# Patient Record
Sex: Female | Born: 1940 | Race: Black or African American | Hispanic: No | Marital: Single | Smoking: Never smoker
Health system: Southern US, Community
[De-identification: ages and names within clinical notes are randomized; demographics above are authoritative.]

## PROBLEM LIST (undated history)

## (undated) DIAGNOSIS — I10 Essential (primary) hypertension: Secondary | ICD-10-CM

## (undated) DIAGNOSIS — E119 Type 2 diabetes mellitus without complications: Secondary | ICD-10-CM

## (undated) DIAGNOSIS — M1990 Unspecified osteoarthritis, unspecified site: Secondary | ICD-10-CM

## (undated) DIAGNOSIS — K635 Polyp of colon: Secondary | ICD-10-CM

## (undated) DIAGNOSIS — E785 Hyperlipidemia, unspecified: Secondary | ICD-10-CM

## (undated) DIAGNOSIS — K922 Gastrointestinal hemorrhage, unspecified: Secondary | ICD-10-CM

## (undated) DIAGNOSIS — C50919 Malignant neoplasm of unspecified site of unspecified female breast: Secondary | ICD-10-CM

## (undated) DIAGNOSIS — Z923 Personal history of irradiation: Secondary | ICD-10-CM

## (undated) DIAGNOSIS — I639 Cerebral infarction, unspecified: Secondary | ICD-10-CM

## (undated) DIAGNOSIS — Z9221 Personal history of antineoplastic chemotherapy: Secondary | ICD-10-CM

## (undated) HISTORY — PX: ABDOMINAL HYSTERECTOMY: SHX81

## (undated) HISTORY — DX: Unspecified osteoarthritis, unspecified site: M19.90

## (undated) HISTORY — DX: Cerebral infarction, unspecified: I63.9

## (undated) HISTORY — PX: BREAST BIOPSY: SHX20

## (undated) HISTORY — DX: Gastrointestinal hemorrhage, unspecified: K92.2

## (undated) HISTORY — DX: Malignant neoplasm of unspecified site of unspecified female breast: C50.919

## (undated) HISTORY — DX: Polyp of colon: K63.5

## (undated) HISTORY — DX: Essential (primary) hypertension: I10

## (undated) HISTORY — DX: Type 2 diabetes mellitus without complications: E11.9

---

## 2014-08-02 HISTORY — PX: BREAST LUMPECTOMY: SHX2

## 2016-12-06 ENCOUNTER — Encounter (HOSPITAL_COMMUNITY): Payer: Self-pay | Admitting: Emergency Medicine

## 2016-12-06 ENCOUNTER — Emergency Department (HOSPITAL_COMMUNITY)
Admission: EM | Admit: 2016-12-06 | Discharge: 2016-12-06 | Disposition: A | Discharge status: Home / Self Care | Payer: Medicare Other | Attending: Emergency Medicine | Admitting: Emergency Medicine

## 2016-12-06 DIAGNOSIS — Z5321 Procedure and treatment not carried out due to patient leaving prior to being seen by health care provider: Secondary | ICD-10-CM | POA: Diagnosis not present

## 2016-12-06 DIAGNOSIS — R42 Dizziness and giddiness: Secondary | ICD-10-CM | POA: Diagnosis not present

## 2016-12-06 HISTORY — DX: Essential (primary) hypertension: I10

## 2016-12-06 HISTORY — DX: Type 2 diabetes mellitus without complications: E11.9

## 2016-12-06 HISTORY — DX: Hyperlipidemia, unspecified: E78.5

## 2016-12-06 LAB — BASIC METABOLIC PANEL
Anion gap: 10 (ref 5–15)
BUN: 26 mg/dL — AB (ref 6–20)
CHLORIDE: 102 mmol/L (ref 101–111)
CO2: 23 mmol/L (ref 22–32)
CREATININE: 1.3 mg/dL — AB (ref 0.44–1.00)
Calcium: 8.9 mg/dL (ref 8.9–10.3)
GFR calc Af Amer: 45 mL/min — ABNORMAL LOW (ref >60)
GFR calc non Af Amer: 39 mL/min — ABNORMAL LOW (ref >60)
GLUCOSE: 148 mg/dL — AB (ref 65–99)
Potassium: 4.3 mmol/L (ref 3.5–5.1)
Sodium: 135 mmol/L (ref 135–145)

## 2016-12-06 LAB — CBC
HCT: 35.1 % — ABNORMAL LOW (ref 36.0–46.0)
Hemoglobin: 11.3 g/dL — ABNORMAL LOW (ref 12.0–15.0)
MCH: 27.2 pg (ref 26.0–34.0)
MCHC: 32.2 g/dL (ref 30.0–36.0)
MCV: 84.4 fL (ref 78.0–100.0)
PLATELETS: 192 10*3/uL (ref 150–400)
RBC: 4.16 MIL/uL (ref 3.87–5.11)
RDW: 14.1 % (ref 11.5–15.5)
WBC: 6.5 10*3/uL (ref 4.0–10.5)

## 2016-12-06 LAB — CBG MONITORING, ED: Glucose-Capillary: 147 mg/dL — ABNORMAL HIGH (ref 65–99)

## 2016-12-06 NOTE — ED Triage Notes (Signed)
Brought in by EMS from home with c/o intermittent dizziness, onset a week ago after moving here to [place].  Pt also reports multiple falls from dizziness but no injury from those falls.  Pt denies any pain.

## 2016-12-06 NOTE — ED Notes (Signed)
Bed: FV43 Expected date:  Expected time:  Means of arrival:  Comments: 76 yr old dizziness

## 2016-12-06 NOTE — ED Notes (Signed)
Pt and pt's son requested to have PIV removed because ,"We have to go".

## 2016-12-16 ENCOUNTER — Telehealth: Payer: Self-pay | Admitting: *Deleted

## 2016-12-16 NOTE — Telephone Encounter (Signed)
"  They're faxing papers for me.  I newly moved to [place].  Have they been received?  Call me [phone]."  "I called to make sure Dr. Marin Olp received my records from [place].  I have a port-a-cath I need work done to.  it was worked on the last of July.  I live in [place].  Records were faxed to [phone]."  New patient coordinator has not received records.  Asked patient to have the office refax indicating new patient referral.  The [facility] office will assign a provider with first availability pending receipt of records and patient needs.  Provided regular fax number [phone] as an alternative.

## 2016-12-17 ENCOUNTER — Telehealth: Payer: Self-pay | Admitting: Hematology

## 2016-12-17 NOTE — Telephone Encounter (Signed)
Pt has been scheduled to see Dr. Burr Medico on 9/6 at 11am. Pt stated that she still has port in place and needs to be flushed. Address and insurance verified. Letter mailed.

## 2016-12-31 NOTE — Progress Notes (Signed)
[facility]  Telephone:([phone]) [phone] [phone]) [phone]  Clinic New Consult Note   Patient Care Team: Patient, No Pcp Per as PCP - General (General Practice) 01/02/2017  CHIEF COMPLAINTS/PURPOSE OF CONSULTATION:  Patient relocated from [place]; here to establish care with primary oncologist for Malignant neoplasm of UIQ right breast  HISTORY OF PRESENTING ILLNESS:  Jasmine Carlson 77 y.o. female is here because of right breast cancer that was found on an annual screening mammogram on 05/25/2014 in [place]. A lump was not palpable. She had fatigue at the time of initial diagnosis. Pt was evaluated at [facility] with Medical Oncology and Radiation Oncology.   Pt had a mammogram completed on 05/25/2014 that showed suspicious lesion at the 3 o'clock position in the right breast. She underwent US on 06/15/2014 that showed suspicious nodule, a spiculated, hypoechoic mass, 1.1 cm at the 1 o'clock position. 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 Pt had a right breast lumpectomy completed on 08/02/2014 with results revealing: Invasive ductal carcinoma, grade 2 with surgical margins free of carcinoma. Of the 2 lymphnodes excised, all were negative with superior, inferior, medial, and lateral margins negative. final stage was T1cN0 dx. ER 100%+, PR 75-80%+ and HER2/neu amplified by FISH. Pt had a DEXA scan completed on 09/06/2014: IMPRESSION: Increased activity noted in the cervical spine which is most likely secondary to degenerative changes. Followup study is recommended. No abnormal long bone activity. Degenerative changes noted in the knees and feet. Pt also underwent a CT chest on 09/06/14 with results revealing:  IMPRESSION: 1. Multiple pulmonary nodules bilaterally up to 5 mm are nonspecific and metastases cannot be excluded. Recommend a close f/u chest CT in 3  months to assess stability. 2. No thoracic lymphadenopathy. 3. Soft tissue mass in the right breast as described is consistent with the known primary malignancy. Pt had a follow up CT CAP completed on 09/13/14 with results: IMPRESSION: Adjacent soft tissue nodules infereior to the right hepatic lobe posteriorly up to 0.9 cm are nonspecific. Continued CT followup is recommended in this patient with breast cancer. 2. There is otherwise no mass or LAD in the abdomen and pelvis. 3. Colonic diverticulosis. 4. status post hysterectomy.   She completed chemotherapy with Taxol and Herceptin for 3 months, then completed single agent Herceptin on 10/03/2015. She remembers tolerating this well without persistent side effects.She was treated with adjuvant external beam radiation from 06/12/2015 to 08/04/2015. She began adjuvant endocrine therapy on Arimidex after radiation.  Pt had a follow up CT chest with contrast completed on 06/25/2016 with results revealing: IMPRESSION: 1. Unchanged multiple subcentimeter pulmonary nodules bilaterally. there is no new pulmonary nodules. 2. No thoracic LAD. 3. new right lung post radiation changes. 4. New diffuse right breast skin thicking suggestive of postradiation changes.   Jasmine Carlson presents to the office today as scheduled. She is tolerating Arimidex well. She has some joint aches particularly in the knees and mostly while ambulating for distances and walking upstairs. She currently drives but is without a vehicle at this time. She has forms for [place] to fill out to qualify for a SCAT. She moved here in August from [place] to be near family. Her last breast physical and breast exam was 3 months ago and was normal per patient. She completes self breast exams. Her port has not been flushed since July and is asking for a  flush today.  Prior to today's appointment, the patient presented to the ED on 12/06/2016 for dizziness. She experienced 2 falls without syncope. She had labs drawn  and an IV inserted the left without being evaluated. She has not had any episodes since that time. She denies drinking well at that time since she was moving, and may have been dehydrated.her creatinine in the ED was 1.3, we'll recheck that today. She has increased her liquid intake since then. 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 I do not have the report of her evaluation and treatment for that episode.She denies bleeding and has no known history of blood clots. She needs to establish with a PCP. She notes she ran out of losartan a couple days ago.  Pt reports that she was in the ED on 12/06/2016 for dizziness and 2 prior falls without syncope. She denies any recent episodes of dizziness and reports that she may have been dehydrated at the time. She notes that she now consumes more liquids. Per pt chart review, pt LWBS after triage on 12/06/16 and was not evaluated for her dizziness. She denies taking any new medications recently. Pt reports that she takes Plavix and denies hx of blood clots. Pt reports that she is out of her losartan a couple days ago. She notes that she had a mini-stroke 3-4 years ago and will need to obtain a PCP.   On review of systems, pt denies fever, chills, decreased appetite, decreased energy levels. Denies hot flashes, night sweats, mood swings, or depression. Reports weight gain. Denies bleeding or vaginal bleeding. Denies extremity swelling. Denies breast pain, nipple discharge, or breast dimpling. Reports intermittent left foot tingling. Denies numbness. Denies CP, cough, SOB.  Denies pain. Pt denies abdominal pain, nausea, vomiting, constipation, or diarrhea. Denies HA, vision changes, slurred speech, or weakness.Marland Kitchen   MEDICAL HISTORY:  Past Medical History:  Diagnosis Date  . Cancer ([place])    breast  . Diabetes mellitus without complication ([place])   . Hyperlipidemia   . Hypertension    SURGICAL HISTORY: No past surgical history on file.  SOCIAL  HISTORY: Social History   Social History  . Marital status: Single    Spouse name: N/A  . Number of children: N/A  . Years of education: N/A   Occupational History  . Not on file.   Social History Main Topics  . Smoking status: Never Smoker  . Smokeless tobacco: Never Used  . Alcohol use No     Comment: Pt quit drinking a long time ago.  . Drug use: No  . Sexual activity: No   Other Topics Concern  . Not on file   Social History Narrative  . No narrative on file   Denies recent ETOH use, denies smoking cigarettes in the past. She notes that her sister has a hx of breast cancer that was diagnosed post-menopausal. Pt reports that her father has a hx of throat cancer.  Pt states that she has 3 living sons (1 son is currently on disability) and 2 daughters that passed away, one from drug overdose and one AIDS.  FAMILY HISTORY: No family history on file.  ALLERGIES:  has No Known Allergies.  MEDICATIONS:  Current Outpatient Prescriptions  Medication Sig Dispense Refill  . anastrozole (ARIMIDEX) 1 MG tablet Take 1 mg by mouth daily.    . carvedilol (COREG) 6.25 MG tablet Take 6.25 mg by mouth 2 (two) times daily with a meal.    .  clopidogrel (PLAVIX) 75 MG tablet Take 75 mg by mouth daily.    Marland Kitchen FLUoxetine (PROZAC) 40 MG capsule Take 40 mg by mouth daily after breakfast.    . losartan (COZAAR) 100 MG tablet Take 100 mg by mouth daily.    . metFORMIN (GLUCOPHAGE) 500 MG tablet Take 500 mg by mouth daily with supper.    . simvastatin (ZOCOR) 40 MG tablet Take 40 mg by mouth every evening.     No current facility-administered medications for this visit.     REVIEW OF SYSTEMS:  Constitutional: Denies fevers, chills, abnormal night sweats, decreased appetite, or decreased energy levels. Denies hot flashes, mood swings. (+) weight gain.  Eyes: Denies blurriness of vision, double vision or watery eyes Ears, nose, mouth, throat, and face: Denies mucositis or sore  throat Respiratory: Denies cough, dyspnea or wheezes. No SOB. Cardiovascular: Denies palpitation, chest discomfort or lower extremity swelling Gastrointestinal:  Denies nausea, heartburn or change in bowel habits Skin: Denies abnormal skin rashes Musculoskeletal: no swelling.  Lymphatics: Denies new lymphadenopathy or easy bruising Neurological:Denies numbness, tingling or new weaknesses Behavioral/Psych: Mood is stable, no new changes  All other systems were reviewed with the patient and are negative.   PHYSICAL EXAMINATION: ECOG PERFORMANCE STATUS: 0 - Asymptomatic  Vitals:   01/02/17 1121  BP: (!) 147/64  Pulse: 71  Resp: 18  Temp: 99.1 F (37.3 C)  SpO2: 98%   Filed Weights   01/02/17 1121  Weight: 167 lb 4.8 oz (75.9 kg)    GENERAL:alert, no distress and comfortable SKIN: skin color, texture, turgor are normal, no rashes or significant lesions EYES: normal, conjunctiva are pink and non-injected, sclera clear OROPHARYNX:no exudate, no erythema and lips, buccal mucosa, and tongue normal  NECK: supple, thyroid normal size, non-tender, without nodularity LYMPH:  no palpable lymphadenopathy in the cervical, axillary or inguinal LUNGS: clear to auscultation and percussion with normal breathing effort HEART: regular rate & rhythm and no murmurs and no lower extremity edema ABDOMEN:abdomen soft, non-tender and normal bowel sounds Musculoskeletal:no cyanosis of digits and no clubbing  PSYCH: alert & oriented x 3 with fluent speech NEURO: no focal motor/sensory deficits BREAST: Breast inspection showed them to be symmetrical with no nipple discharge. Palpation of the breasts and axilla revealed no obvious mass that I could appreciate.   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   Right breast lumpectomy 08/02/2014 Invasive ductal carcinoma, grade 2 with surgical margins free of carcinoma. Of the 2 lymphnodes excised, all were negative with superior, inferior, medial, and lateral margins negative. final stage was T1cN0 dx. ER 100%+, PR 75-80%+ and HER2/neu amplified by FISH.  RADIOGRAPHIC STUDIES: I have personally reviewed the radiological images as listed and agreed with the findings in the report. No  results found.   Mammogram 05/25/2014 Suspicious lesion at the 3 o'clock position in the right breast.    06/15/14  Suspicious nodule, a spiculated, hypoechoic mass, 1.1 cm at the 1 o'clock position.   DEXA scan 09/06/2014 IMPRESSION: Increased activity noted in the cervical spine which is most likely secondary to degenerative changes. Followup study is recommended. No abnormal long bone activity. Degenerative changes noted in the knees and feet.   CT chest 09/06/14  IMPRESSION: 1. Multiple pulmonary nodules bilaterally up to 5 mm are nonspecific and metastases cannot be excluded. Recommend a close f/u chest CT in 3 months to assess stability. 2. No thoracic lymphadenopathy. 3. Soft tissue mass in the right breast as described is consistent with the known primary malignancy.   CT CAP 09/13/14  IMPRESSION: Adjacent soft tissue nodules infereior to the right hepatic lobe posteriorly up to 0.9 cm are nonspecific. Continued CT followup is recommended in this patient with breast cancer. 2. There is otherwise no mass or LAD in the abdomen and pelvis. 3. Colonic diverticulosis. 4. status post  hysterectomy.   CT chest with contrast 06/25/2016  IMPRESSION: 1. unchanged multiple subcentimeter pulmonary nodules bilaterally. there is no new pulmonary nodules. 2. No thoracic LAD. 3. new right lung post radiation changes. 4. new diffuse right breast skin thicking suggestive of postradiation changes.   ASSESSMENT & PLAN: 76 y.o. african-american female, with a PMHx of breast cancer, DM, and HTN presented with abnormal finding on screening mammogram in 05/25/2014. Pt was treated with radiation therapy 06/12/2015-08/04/2015 (external beam radiation therapy) and chemotherapy of taxol (3 months), herceptin (1 year), and is currently on anastrozole.  1. 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 Unfortunately the clinic notes from her oncologist were not available druing her visit with [place] today, I will request again  -We reviewed her pathology and radiology results in detail. -she had lumpectomy and sentinel lymph node biopsy with negative margins, adjuvant chemotherapy, radiation, and now on adjuvant letrozole.  -She is tolerating letrozole very well, we'll continue, for total 5-7 years. -We discussed the risk of cancer recurrence. Given her early stage HER-2 positive disease, she has moderate risk for recurrence. -Continue breast cancer surveillance. She is compliant with annual mammogram, self exam, and I encouraged her to follow-up with [place] every 6 months. -I encouraged her to have healthy diet and exercise regularly  2. Genetics -due to family history I have recommended that she be seen by our genetic counselor for counseling and for possible genetic testing to ruled out inheritable cancer syndrome.  3. Bone Health -Her last bone density scan was normal per patient -We discussed the potential side effects of osteopenia and osteoporosis from letrozole -I encouraged  her to continue calcium and vitamin D supplement -We'll repeat a bone density scan next year.  4. HTN and dizziness -She had a few episodes of dizziness a few months ago, possible related to her dehydration -I encouraged her to find a primary care physician and f/u   Plan: -labs today -follow up in 6 months -genetic referral  -Bilateral diagnostic mammogram and bone density scan at breast center in March 2019.  No orders of the defined types were placed in this encounter.   All questions were answered. The patient knows to call the clinic with any problems, questions or concerns. I spent 50 minutes counseling the patient face to face. The total time  spent in the appointment was 55 minutes and more than 50% was on counseling.     Truitt Merle, MD 01/02/2017    This document serves as a record of services personally performed by Truitt Merle, MD. It was created on her behalf by Steva Colder, a trained medical scribe. The creation of this record is based on the scribe's personal observations and the provider's statements to them. This document has been checked and approved by the attending provider.

## 2017-01-02 ENCOUNTER — Encounter (INDEPENDENT_AMBULATORY_CARE_PROVIDER_SITE_OTHER): Payer: Self-pay

## 2017-01-02 ENCOUNTER — Encounter: Payer: Self-pay | Admitting: Hematology

## 2017-01-02 ENCOUNTER — Other Ambulatory Visit: Payer: Self-pay | Admitting: *Deleted

## 2017-01-02 ENCOUNTER — Telehealth: Payer: Self-pay | Admitting: Hematology

## 2017-01-02 ENCOUNTER — Ambulatory Visit (HOSPITAL_BASED_OUTPATIENT_CLINIC_OR_DEPARTMENT_OTHER): Payer: Medicare HMO | Admitting: Hematology

## 2017-01-02 ENCOUNTER — Other Ambulatory Visit (HOSPITAL_BASED_OUTPATIENT_CLINIC_OR_DEPARTMENT_OTHER): Payer: Medicare HMO

## 2017-01-02 ENCOUNTER — Ambulatory Visit: Payer: Medicare Other

## 2017-01-02 VITALS — BP 147/64 | HR 71 | Temp 99.1°F | Resp 18 | Wt 167.3 lb

## 2017-01-02 DIAGNOSIS — C50911 Malignant neoplasm of unspecified site of right female breast: Secondary | ICD-10-CM

## 2017-01-02 DIAGNOSIS — Z170 Estrogen receptor positive status [ER+]: Principal | ICD-10-CM

## 2017-01-02 DIAGNOSIS — Z79811 Long term (current) use of aromatase inhibitors: Secondary | ICD-10-CM

## 2017-01-02 DIAGNOSIS — I10 Essential (primary) hypertension: Secondary | ICD-10-CM | POA: Diagnosis not present

## 2017-01-02 DIAGNOSIS — E119 Type 2 diabetes mellitus without complications: Secondary | ICD-10-CM

## 2017-01-02 DIAGNOSIS — C50211 Malignant neoplasm of upper-inner quadrant of right female breast: Secondary | ICD-10-CM | POA: Diagnosis not present

## 2017-01-02 LAB — COMPREHENSIVE METABOLIC PANEL
ALT: 11 U/L (ref 0–55)
ANION GAP: 9 meq/L (ref 3–11)
AST: 15 U/L (ref 5–34)
Albumin: 3.5 g/dL (ref 3.5–5.0)
Alkaline Phosphatase: 82 U/L (ref 40–150)
BILIRUBIN TOTAL: 0.34 mg/dL (ref 0.20–1.20)
BUN: 14.2 mg/dL (ref 7.0–26.0)
CHLORIDE: 105 meq/L (ref 98–109)
CO2: 24 meq/L (ref 22–29)
CREATININE: 1.1 mg/dL (ref 0.6–1.1)
Calcium: 9.8 mg/dL (ref 8.4–10.4)
EGFR: 57 mL/min/{1.73_m2} — ABNORMAL LOW (ref >90)
GLUCOSE: 118 mg/dL (ref 70–140)
Potassium: 4.3 mEq/L (ref 3.5–5.1)
SODIUM: 138 meq/L (ref 136–145)
TOTAL PROTEIN: 7.4 g/dL (ref 6.4–8.3)

## 2017-01-02 MED ORDER — LOSARTAN POTASSIUM 100 MG PO TABS: 100.0000 mg | ORAL_TABLET | Freq: Every day | ORAL | 0 refills | Status: DC

## 2017-01-02 NOTE — Telephone Encounter (Signed)
Gave patient AVS and calendar for upcoming appointments. Scheduled patient for appointments per 9/6 los

## 2017-01-02 NOTE — Progress Notes (Signed)
Pt recently relocated here. Per MD Burr Medico, ok to use Port-a-cath for flushes with last CT PET identifying tip placement from 04/25/15.

## 2017-01-02 NOTE — Patient Instructions (Signed)
Implanted Port Home Guide An implanted port is a type of central line that is placed under the skin. Central lines are used to provide IV access when treatment or nutrition needs to be given through a person's veins. Implanted ports are used for long-term IV access. An implanted port may be placed because:  You need IV medicine that would be irritating to the small veins in your hands or arms.  You need long-term IV medicines, such as antibiotics.  You need IV nutrition for a long period.  You need frequent blood draws for lab tests.  You need dialysis.  Implanted ports are usually placed in the chest area, but they can also be placed in the upper arm, the abdomen, or the leg. An implanted port has two main parts:  Reservoir. The reservoir is round and will appear as a small, raised area under your skin. The reservoir is the part where a needle is inserted to give medicines or draw blood.  Catheter. The catheter is a thin, flexible tube that extends from the reservoir. The catheter is placed into a large vein. Medicine that is inserted into the reservoir goes into the catheter and then into the vein.  How will I care for my incision site? Do not get the incision site wet. Bathe or shower as directed by your health care provider. How is my port accessed? Special steps must be taken to access the port:  Before the port is accessed, a numbing cream can be placed on the skin. This helps numb the skin over the port site.  Your health care provider uses a sterile technique to access the port. ? Your health care provider must put on a mask and sterile gloves. ? The skin over your port is cleaned carefully with an antiseptic and allowed to dry. ? The port is gently pinched between sterile gloves, and a needle is inserted into the port.  Only "non-coring" port needles should be used to access the port. Once the port is accessed, a blood return should be checked. This helps ensure that the port  is in the vein and is not clogged.  If your port needs to remain accessed for a constant infusion, a clear (transparent) bandage will be placed over the needle site. The bandage and needle will need to be changed every week, or as directed by your health care provider.  Keep the bandage covering the needle clean and dry. Do not get it wet. Follow your health care provider's instructions on how to take a shower or bath while the port is accessed.  If your port does not need to stay accessed, no bandage is needed over the port.  What is flushing? Flushing helps keep the port from getting clogged. Follow your health care provider's instructions on how and when to flush the port. Ports are usually flushed with saline solution or a medicine called heparin. The need for flushing will depend on how the port is used.  If the port is used for intermittent medicines or blood draws, the port will need to be flushed: ? After medicines have been given. ? After blood has been drawn. ? As part of routine maintenance.  If a constant infusion is running, the port may not need to be flushed.  How long will my port stay implanted? The port can stay in for as long as your health care provider thinks it is needed. When it is time for the port to come out, surgery will be   done to remove it. The procedure is similar to the one performed when the port was put in. When should I seek immediate medical care? When you have an implanted port, you should seek immediate medical care if:  You notice a bad smell coming from the incision site.  You have swelling, redness, or drainage at the incision site.  You have more swelling or pain at the port site or the surrounding area.  You have a fever that is not controlled with medicine.  This information is not intended to replace advice given to you by your health care provider. Make sure you discuss any questions you have with your health care provider. Document  Released: 04/15/2005 Document Revised: 09/21/2015 Document Reviewed: 12/21/2012 Elsevier Interactive Patient Education  2017 [employer]  

## 2017-01-04 ENCOUNTER — Encounter: Payer: Self-pay | Admitting: Hematology

## 2017-01-08 ENCOUNTER — Telehealth: Payer: Self-pay | Admitting: *Deleted

## 2017-01-08 ENCOUNTER — Other Ambulatory Visit: Payer: Self-pay | Admitting: *Deleted

## 2017-01-08 DIAGNOSIS — I10 Essential (primary) hypertension: Secondary | ICD-10-CM

## 2017-01-08 MED ORDER — CLOPIDOGREL BISULFATE 75 MG PO TABS: 75.0000 mg | ORAL_TABLET | Freq: Every day | ORAL | 0 refills | Status: DC

## 2017-01-08 MED ORDER — CARVEDILOL 6.25 MG PO TABS: 6.2500 mg | ORAL_TABLET | Freq: Two times a day (BID) | ORAL | 0 refills | Status: DC

## 2017-01-08 MED ORDER — ANASTROZOLE 1 MG PO TABS: 1.0000 mg | ORAL_TABLET | Freq: Every day | ORAL | 4 refills | Status: DC

## 2017-01-08 MED ORDER — SIMVASTATIN 40 MG PO TABS: 40.0000 mg | ORAL_TABLET | Freq: Every evening | ORAL | 0 refills | Status: DC

## 2017-01-08 MED ORDER — LOSARTAN POTASSIUM 100 MG PO TABS: 100.0000 mg | ORAL_TABLET | Freq: Every day | ORAL | 0 refills | Status: DC

## 2017-01-08 MED ORDER — METFORMIN HCL 500 MG PO TABS: 500.0000 mg | ORAL_TABLET | Freq: Every day | ORAL | 0 refills | Status: DC

## 2017-01-08 NOTE — Telephone Encounter (Signed)
Yes, please refill arimidex for 12 months, other meds for 1 month with 1 refill. Thanks   Truitt Merle MD

## 2017-01-08 NOTE — Telephone Encounter (Signed)
Received call from pt asking for refill on all her meds.  She needs, arimidex, metformin, coreg, plavix, & simvastatin called to [facility] on [address].  She states she doesn't have a PCP yet but is working on it & ask if we would call in a months supply.  She has a written script for losartan but asked if we could also send it to pharmacy so everything will be ready at one time. Message to Dr Burr Medico.  Call back # is [phone].

## 2017-01-21 ENCOUNTER — Telehealth: Payer: Self-pay | Admitting: Hematology

## 2017-02-03 ENCOUNTER — Ambulatory Visit (HOSPITAL_BASED_OUTPATIENT_CLINIC_OR_DEPARTMENT_OTHER): Payer: Medicare HMO

## 2017-02-03 ENCOUNTER — Encounter: Payer: Self-pay | Admitting: Hematology

## 2017-02-03 VITALS — BP 145/72 | HR 73 | Temp 98.7°F | Resp 18

## 2017-02-03 DIAGNOSIS — Z452 Encounter for adjustment and management of vascular access device: Secondary | ICD-10-CM

## 2017-02-03 DIAGNOSIS — C50211 Malignant neoplasm of upper-inner quadrant of right female breast: Secondary | ICD-10-CM

## 2017-02-03 DIAGNOSIS — Z95828 Presence of other vascular implants and grafts: Secondary | ICD-10-CM

## 2017-02-03 MED ORDER — SODIUM CHLORIDE 0.9% FLUSH
10.0000 mL | INTRAVENOUS | Status: DC | PRN
  Administered 2017-02-03: 10 mL via INTRAVENOUS
  Filled 2017-02-03: qty 10

## 2017-02-03 MED ORDER — HEPARIN SOD (PORK) LOCK FLUSH 100 UNIT/ML IV SOLN
500.0000 [IU] | Freq: Once | INTRAVENOUS | Status: AC
  Administered 2017-02-03: 500 [IU] via INTRAVENOUS
  Filled 2017-02-03: qty 5

## 2017-02-05 ENCOUNTER — Telehealth: Payer: Self-pay | Admitting: Hematology

## 2017-02-05 ENCOUNTER — Ambulatory Visit (INDEPENDENT_AMBULATORY_CARE_PROVIDER_SITE_OTHER): Payer: Medicare HMO | Admitting: Family Medicine

## 2017-02-05 ENCOUNTER — Encounter: Payer: Self-pay | Admitting: Family Medicine

## 2017-02-05 VITALS — BP 150/80 | HR 82 | Temp 98.1°F | Ht 62.5 in | Wt 168.3 lb

## 2017-02-05 DIAGNOSIS — I10 Essential (primary) hypertension: Secondary | ICD-10-CM

## 2017-02-05 DIAGNOSIS — Z7689 Persons encountering health services in other specified circumstances: Secondary | ICD-10-CM | POA: Diagnosis not present

## 2017-02-05 DIAGNOSIS — E119 Type 2 diabetes mellitus without complications: Secondary | ICD-10-CM

## 2017-02-05 DIAGNOSIS — Z853 Personal history of malignant neoplasm of breast: Secondary | ICD-10-CM | POA: Diagnosis not present

## 2017-02-05 DIAGNOSIS — H6122 Impacted cerumen, left ear: Secondary | ICD-10-CM | POA: Diagnosis not present

## 2017-02-05 DIAGNOSIS — Z8673 Personal history of transient ischemic attack (TIA), and cerebral infarction without residual deficits: Secondary | ICD-10-CM

## 2017-02-05 DIAGNOSIS — G8929 Other chronic pain: Secondary | ICD-10-CM | POA: Diagnosis not present

## 2017-02-05 DIAGNOSIS — M25562 Pain in left knee: Secondary | ICD-10-CM

## 2017-02-05 DIAGNOSIS — M25511 Pain in right shoulder: Secondary | ICD-10-CM | POA: Diagnosis not present

## 2017-02-05 DIAGNOSIS — M25561 Pain in right knee: Secondary | ICD-10-CM | POA: Diagnosis not present

## 2017-02-05 MED ORDER — CLOPIDOGREL BISULFATE 75 MG PO TABS: 75.0000 mg | ORAL_TABLET | Freq: Every day | ORAL | 2 refills | Status: DC

## 2017-02-05 MED ORDER — LOSARTAN POTASSIUM 100 MG PO TABS: 100.0000 mg | ORAL_TABLET | Freq: Every day | ORAL | 3 refills | Status: DC

## 2017-02-05 MED ORDER — METFORMIN HCL 500 MG PO TABS: 500.0000 mg | ORAL_TABLET | Freq: Every day | ORAL | 3 refills | Status: DC

## 2017-02-05 MED ORDER — SIMVASTATIN 40 MG PO TABS: 40.0000 mg | ORAL_TABLET | Freq: Every evening | ORAL | 2 refills | Status: DC

## 2017-02-05 MED ORDER — CARVEDILOL 6.25 MG PO TABS: 6.2500 mg | ORAL_TABLET | Freq: Two times a day (BID) | ORAL | 3 refills | Status: DC

## 2017-02-05 NOTE — Progress Notes (Signed)
Patient presents to clinic today to establish care/ acute concerns.  SUBJECTIVE: PMH:  Pt is a 76 yo female, with pmh sig for h/o breast cancer, arthritis, HTN, DM, h/o TIA.  Pt recently moved from [place].  She was formerly seen by Dr. Luvenia Heller for primary care and Dr. Lyndel Safe, Oncologist.  Per pt her PCP was located at [place] [facility] on [address] in [place].  H/o Breast Ca -R breast lumpectomy -s/p chemo and radiation -port in place L upper chest, flushed q 6 wks -Now followed by Dr. Burr Medico at [facility].  R arm pain: -x 2 wks -aching in top part of arm, not quite shoulder -pt attributed the pain to laying on her side, so she started sleeping on her back, but the pain continued. -pt has tried aspercream  B/l Knee pain: -told arthritis in the past -hurting more as pt's apt is now on the 2nd floor.  Rental office to notify her if 1st floor apt becomes available -becomes worse with sitting. -aspercream helps some -pt has never had injections or xrays done.  DM: -taking Metformin 500 mg at supper.  Was on 500 BID, but recently changed. -was taking fsbs at home.  Range under 100-1teens.  130 has been the highest. -pt's meter recently broke, but she is getting a new one any day now. -had labs done a few months ago.  Thinks hgb A1c was 6.1%  Hearing difficulty: -turns the tv up loud -tries not to use Q-tips to clean ears -occasionally gets peeling of outer ear, "similar to cradle cap".  Allergies:  NKDA  Surgeries: lumpectomy  Social Hx: Pt recently moved from [place].  Pt is currently living with her son in an apt.  Pt does not use tobacco, etoh, or drugs.    FMHx: Mom- desc Dad- desc Sister, Pamala Hurry- alive, Breast Ca, DM Brother- desc Brother-desc Daughter-desc   Health Maintenance: Mammogram -- 2017 Colonoscopy 2012 or 2013  Past Medical History:  Diagnosis Date  . Cancer ([place])    breast  . Diabetes  mellitus without complication ([place])   . Hyperlipidemia   . Hypertension     Past Surgical History:  Procedure Laterality Date  . ABDOMINAL HYSTERECTOMY     30 years ago  . BREAST LUMPECTOMY Right 08/02/2014    Current Outpatient Prescriptions on File Prior to Visit  Medication Sig Dispense Refill  . anastrozole (ARIMIDEX) 1 MG tablet Take 1 tablet (1 mg total) by mouth daily. 90 tablet 4   No current facility-administered medications on file prior to visit.     No Known Allergies  Family History  Problem Relation Age of Onset  . Cancer Father        throat  . Cancer Sister        breast cancer; postmenopausal; double mastectomy    Social History   Social History  . Marital status: Single    Spouse name: N/A  . Number of children: N/A  . Years of education: N/A   Occupational History  . Not on file.   Social History Main Topics  . Smoking status: Never Smoker  . Smokeless tobacco: Never Used  . Alcohol use No     Comment: Pt quit drinking a long time ago.  . Drug use: No  . Sexual activity: No   Other Topics Concern  . Not on file   Social History Narrative  . No narrative on file  ROS General: Denies fever, chills, night sweats, changes in weight, changes in appetite HEENT: Denies headaches, ear pain, changes in vision, rhinorrhea, sore throat  +hearing loss CV: Denies CP, palpitations, SOB, orthopnea Pulm: Denies SOB, cough, wheezing GI: Denies abdominal pain, nausea, vomiting, diarrhea, constipation GU: Denies dysuria, hematuria, frequency, vaginal discharge Msk: Denies muscle cramps, joint pains  +b/l knee pain, R upper arm pain, arthritis Neuro: Denies weakness, numbness, tingling Skin: Denies rashes, bruising Psych: Denies depression, anxiety, hallucinations  BP (!) 150/80 (BP Location: Right Arm, Patient Position: Sitting, Cuff Size: Normal)   Pulse 82   Temp 98.1 F (36.7 C) (Oral)   Ht 5' 2.5" (1.588 m)   Wt 168 lb 4.8 oz (76.3 kg)    BMI 30.29 kg/m   Physical Exam Gen. Pleasant, well developed, well-nourished, in NAD HEENT - [place]/AT, PERRL, no scleral icterus, no nasal drainage, pharynx without erythema or exudate. Cerumen impaction in L ear blocking TM.  Ear flushed with hydrogen peroxide/water, Cerumen removed with curette, R ear with small amount of cerumen in canal. L TM normal after cerumen removal, with a small piece remaining-ear wax drops placed in ear. R TM visualized and normal. Neck: No JVD, no thyromegaly Lungs: no accessory muscle use, CTAB, no wheezes, rales or rhonchi Cardiovascular: RRR, No r/g/m, no peripheral edema Abdomen: BS present, soft, nontender,nondistended, no hepatosplenomegaly Musculoskeletal: moves all four extremities, no cyanosis or clubbing, normal tone.  R knee w/o TTP, with mild effusion, no erythema.  R knee > L knee.  R shoulder without TTP, with crepitus, + cross arm test, + empty can.   Neuro:  A&Ox3, CN II-XII intact, normal gait Skin:  Warm, dry, intact, no lesions Psych: normal affect, mood approrpriate   Recent Results (from the past 2160 hour(s))  CBG monitoring, ED     Status: Abnormal   Collection Time: 12/06/16 10:19 PM  Result Value Ref Range   Glucose-Capillary 147 (H) 65 - 99 mg/dL  Basic metabolic panel     Status: Abnormal   Collection Time: 12/06/16 10:22 PM  Result Value Ref Range   Sodium 135 135 - 145 mmol/L   Potassium 4.3 3.5 - 5.1 mmol/L   Chloride 102 101 - 111 mmol/L   CO2 23 22 - 32 mmol/L   Glucose, Bld 148 (H) 65 - 99 mg/dL   BUN 26 (H) 6 - 20 mg/dL   Creatinine, Ser 1.30 (H) 0.44 - 1.00 mg/dL   Calcium 8.9 8.9 - 10.3 mg/dL   GFR calc non Af Amer 39 (L) >60 mL/min   GFR calc Af Amer 45 (L) >60 mL/min    Comment: (NOTE) The eGFR has been calculated using the CKD EPI equation. This calculation has not been validated in all clinical situations. eGFR's persistently <60 mL/min signify possible Chronic Kidney Disease.    Anion gap 10 5 - 15  CBC      Status: Abnormal   Collection Time: 12/06/16 10:22 PM  Result Value Ref Range   WBC 6.5 4.0 - 10.5 K/uL   RBC 4.16 3.87 - 5.11 MIL/uL   Hemoglobin 11.3 (L) 12.0 - 15.0 g/dL   HCT 35.1 (L) 36.0 - 46.0 %   MCV 84.4 78.0 - 100.0 fL   MCH 27.2 26.0 - 34.0 pg   MCHC 32.2 30.0 - 36.0 g/dL   RDW 14.1 11.5 - 15.5 %   Platelets 192 150 - 400 K/uL  Comprehensive metabolic panel     Status: Abnormal   Collection Time:   01/02/17  1:18 PM  Result Value Ref Range   Sodium 138 136 - 145 mEq/L   Potassium 4.3 3.5 - 5.1 mEq/L   Chloride 105 98 - 109 mEq/L   CO2 24 22 - 29 mEq/L   Glucose 118 70 - 140 mg/dl    Comment: Glucose reference range is for nonfasting patients. Fasting glucose reference range is 70- 100.   BUN 14.2 7.0 - 26.0 mg/dL   Creatinine 1.1 0.6 - 1.1 mg/dL   Total Bilirubin 0.34 0.20 - 1.20 mg/dL   Alkaline Phosphatase 82 40 - 150 U/L   AST 15 5 - 34 U/L   ALT 11 0 - 55 U/L   Total Protein 7.4 6.4 - 8.3 g/dL   Albumin 3.5 3.5 - 5.0 g/dL   Calcium 9.8 8.4 - 10.4 mg/dL   Anion Gap 9 3 - 11 mEq/L   EGFR 57 (L) >90 ml/min/1.73 m2    Comment: eGFR is calculated using the CKD-EPI Creatinine Equation (2009)    Assessment/Plan: Acute pain of right shouler -impingement on exam -discussed various causes -given handout -continue with Aspercream. -if something needed for pain will consider tylenol arthritis strength. - Plan: DG Shoulder Right  Chronic pain of both knees  -discussed possible causes including arthritis -will order imaging -continue aspercream as helping -discussed alternative treatments.  Given small effusion discussed aspiration and steroid injection if knee becomes larger or pain increases. -Plan: DG KNEE 1-2 VIEWS BILAT  Controlled type 2 diabetes mellitus without complication, without long-term current use of insulin (HCC)  -Discussed symptoms of hypo or hyperglycemia -Pt to continue fsbs  -will check Hgb A1C in the next few months as she had recently -  Plan: metFORMIN (GLUCOPHAGE) 500 MG tablet  Essential hypertension  -BP 148/65 on repeat -Discussed eating better, doing some physical activity daily -had recent lab work, will wait to repeat - Plan: carvedilol (COREG) 6.25 MG tablet, losartan (COZAAR) 100 MG tablet  History of breast cancer -followed by Dr. Burr Medico -reminded of need for mammogram, will have scheduled  History of TIA (transient ischemic attack) -discussed the importance of eating healthy, controlling bp  - Plan: clopidogrel (PLAVIX) 75 MG tablet, simvastatin (ZOCOR) 40 MG tablet  Encounter to establish care -records release form: Dr. Luvenia Heller  Address: [phone] the professional building at [place]. [place]  Impacted cerumen of left ear -L ear flushed -manual cerumen removal by this provider -ear wax removal gtts and cotton placed in L ear    F/u in 2-4 wks for knee and shoulder.

## 2017-02-05 NOTE — Patient Instructions (Addendum)
Knee Pain, Adult Many things can cause knee pain. The pain often goes away on its own with time and rest. If the pain does not go away, tests may be done to find out what is causing the pain. Follow these instructions at home: Activity  Rest your knee.  Do not do things that cause pain.  Avoid activities where both feet leave the ground at the same time (high-impact activities). Examples are running, jumping rope, and doing jumping jacks. General instructions  Take medicines only as told by your doctor.  Raise (elevate) your knee when you are resting. Make sure your knee is higher than your heart.  Sleep with a pillow under your knee.  If told, put ice on the knee: ? Put ice in a plastic bag. ? Place a towel between your skin and the bag. ? Leave the ice on for 20 minutes, 2-3 times a day.  Ask your doctor if you should wear an elastic knee support.  Lose weight if you are overweight. Being overweight can make your knee hurt more.  Do not use any tobacco products. These include cigarettes, chewing tobacco, or electronic cigarettes. If you need help quitting, ask your doctor. Smoking may slow down healing. Contact a doctor if:  The pain does not stop.  The pain changes or gets worse.  You have a fever along with knee pain.  Your knee gives out or locks up.  Your knee swells, and becomes worse. Get help right away if:  Your knee feels warm.  You cannot move your knee.  You have very bad knee pain.  You have chest pain.  You have trouble breathing. Summary  Many things can cause knee pain. The pain often goes away on its own with time and rest.  Avoid activities that put stress on your knee. These include running and jumping rope.  Get help right away if you cannot move your knee, or if your knee feels warm, or if you have trouble breathing. This information is not intended to replace advice given to you by your health care provider. Make sure you discuss any  questions you have with your health care provider. Document Released: 07/12/2008 Document Revised: 04/09/2016 Document Reviewed: 04/09/2016 Elsevier Interactive Patient Education  2017 [place], Adult The ears produce a substance called earwax that helps keep bacteria out of the ear and protects the skin in the ear canal. Occasionally, earwax can build up in the ear and cause discomfort or hearing loss. What increases the risk? This condition is more likely to develop in people who:  Are female.  Are elderly.  Naturally produce more earwax.  Clean their ears often with cotton swabs.  Use earplugs often.  Use in-ear headphones often.  Wear hearing aids.  Have narrow ear canals.  Have earwax that is overly thick or sticky.  Have eczema.  Are dehydrated.  Have excess hair in the ear canal.  What are the signs or symptoms? Symptoms of this condition include:  Reduced or muffled hearing.  A feeling of fullness in the ear or feeling that the ear is plugged.  Fluid coming from the ear.  Ear pain.  Ear itch.  Ringing in the ear.  Coughing.  An obvious piece of earwax that can be seen inside the ear canal.  How is this diagnosed? This condition may be diagnosed based on:  Your symptoms.  Your medical history.  An ear exam. During the exam, your health care provider  will look into your ear with an instrument called an otoscope.  You may have tests, including a hearing test. How is this treated? This condition may be treated by:  Using ear drops to soften the earwax.  Having the earwax removed by a health care provider. The health care provider may: ? Flush the ear with water. ? Use an instrument that has a loop on the end (curette). ? Use a suction device.  Surgery to remove the wax buildup. This may be done in severe cases.  Follow these instructions at home:  Take over-the-counter and prescription medicines only as told by your  health care provider.  Do not put any objects, including cotton swabs, into your ear. You can clean the opening of your ear canal with a washcloth or facial tissue.  Follow instructions from your health care provider about cleaning your ears. Do not over-clean your ears.  Drink enough fluid to keep your urine clear or pale yellow. This will help to thin the earwax.  Keep all follow-up visits as told by your health care provider. If earwax builds up in your ears often or if you use hearing aids, consider seeing your health care provider for routine, preventive ear cleanings. Ask your health care provider how often you should schedule your cleanings.  If you have hearing aids, clean them according to instructions from the manufacturer and your health care provider. Contact a health care provider if:  You have ear pain.  You develop a fever.  You have blood, pus, or other fluid coming from your ear.  You have hearing loss.  You have ringing in your ears that does not go away.  Your symptoms do not improve with treatment.  You feel like the room is spinning (vertigo). Summary  Earwax can build up in the ear and cause discomfort or hearing loss.  The most common symptoms of this condition include reduced or muffled hearing and a feeling of fullness in the ear or feeling that the ear is plugged.  This condition may be diagnosed based on your symptoms, your medical history, and an ear exam.  This condition may be treated by using ear drops to soften the earwax or by having the earwax removed by a health care provider.  Do not put any objects, including cotton swabs, into your ear. You can clean the opening of your ear canal with a washcloth or facial tissue. This information is not intended to replace advice given to you by your health care provider. Make sure you discuss any questions you have with your health care provider. Document Released: 05/23/2004 Document Revised: 06/26/2016  Document Reviewed: 06/26/2016 Elsevier Interactive Patient Education  2018 [place].  Shoulder Pain Many things can cause shoulder pain, including:  An injury.  Moving the arm in the same way again and again (overuse).  Joint pain (arthritis).  Follow these instructions at home: Take these actions to help with your pain:  Squeeze a soft ball or a foam pad as much as you can. This helps to prevent swelling. It also makes the arm stronger.  Take over-the-counter and prescription medicines only as told by your doctor.  If told, put ice on the area: ? Put ice in a plastic bag. ? Place a towel between your skin and the bag. ? Leave the ice on for 20 minutes, 2-3 times per day. Stop putting on ice if it does not help with the pain.  If you were given a shoulder sling  or immobilizer: ? Wear it as told. ? Remove it to shower or bathe. ? Move your arm as little as possible. ? Keep your hand moving. This helps prevent swelling.  Contact a doctor if:  Your pain gets worse.  Medicine does not help your pain.  You have new pain in your arm, hand, or fingers. Get help right away if:  Your arm, hand, or fingers: ? Tingle. ? Are numb. ? Are swollen. ? Are painful. ? Turn white or blue. This information is not intended to replace advice given to you by your health care provider. Make sure you discuss any questions you have with your health care provider. Document Released: 10/02/2007 Document Revised: 12/10/2015 Document Reviewed: 08/08/2014 Elsevier Interactive Patient Education  [employer].

## 2017-02-05 NOTE — Telephone Encounter (Signed)
Scheduled appt per 10/8 sch message - patient is aware of appt for flush

## 2017-02-11 ENCOUNTER — Telehealth: Payer: Self-pay | Admitting: Family Medicine

## 2017-02-11 NOTE — Telephone Encounter (Signed)
Erroneous encounter

## 2017-02-11 NOTE — Telephone Encounter (Signed)
Pt wants to make sure you got her SCAT form because she needs it to be filled out asap so she can make an appt

## 2017-02-11 NOTE — Telephone Encounter (Signed)
Papers have been received and sat on Dr. Volanda Napoleon desk to be filled out.

## 2017-02-18 NOTE — Telephone Encounter (Signed)
Per. Malachy Mood paper work has been successfully faxed

## 2017-02-24 DIAGNOSIS — M545 Low back pain: Secondary | ICD-10-CM | POA: Diagnosis not present

## 2017-02-24 DIAGNOSIS — M5126 Other intervertebral disc displacement, lumbar region: Secondary | ICD-10-CM | POA: Diagnosis not present

## 2017-02-24 DIAGNOSIS — M6281 Muscle weakness (generalized): Secondary | ICD-10-CM | POA: Diagnosis not present

## 2017-02-28 ENCOUNTER — Other Ambulatory Visit: Payer: Medicare HMO

## 2017-02-28 ENCOUNTER — Ambulatory Visit (INDEPENDENT_AMBULATORY_CARE_PROVIDER_SITE_OTHER)
Admission: RE | Admit: 2017-02-28 | Discharge: 2017-02-28 | Disposition: A | Discharge status: Home / Self Care | Payer: Medicare HMO | Source: Ambulatory Visit | Attending: Family Medicine | Admitting: Family Medicine

## 2017-02-28 DIAGNOSIS — M25511 Pain in right shoulder: Secondary | ICD-10-CM | POA: Diagnosis not present

## 2017-03-05 ENCOUNTER — Telehealth: Payer: Self-pay | Admitting: Family Medicine

## 2017-03-05 NOTE — Telephone Encounter (Signed)
Pt was given the xray result yesterday and now would like to know the next step

## 2017-03-05 NOTE — Telephone Encounter (Signed)
Attempted to contact patient, VM is full. Per Dr. Volanda Napoleon if patient would like we can proceed with PT referral or Sports medicine referral.

## 2017-03-06 NOTE — Telephone Encounter (Signed)
Spoke with patient and she would like to proceed with PT referral. Message has been sent to PCP

## 2017-03-07 ENCOUNTER — Other Ambulatory Visit: Payer: Self-pay | Admitting: Emergency Medicine

## 2017-03-07 DIAGNOSIS — M19019 Primary osteoarthritis, unspecified shoulder: Secondary | ICD-10-CM

## 2017-03-07 DIAGNOSIS — M1990 Unspecified osteoarthritis, unspecified site: Secondary | ICD-10-CM

## 2017-03-25 ENCOUNTER — Ambulatory Visit (HOSPITAL_BASED_OUTPATIENT_CLINIC_OR_DEPARTMENT_OTHER): Payer: Medicare HMO

## 2017-03-25 DIAGNOSIS — Z452 Encounter for adjustment and management of vascular access device: Secondary | ICD-10-CM | POA: Diagnosis not present

## 2017-03-25 DIAGNOSIS — C50211 Malignant neoplasm of upper-inner quadrant of right female breast: Secondary | ICD-10-CM

## 2017-03-25 MED ORDER — HEPARIN SOD (PORK) LOCK FLUSH 100 UNIT/ML IV SOLN
500.0000 [IU] | Freq: Once | INTRAVENOUS | Status: AC
  Administered 2017-03-25: 500 [IU]
  Filled 2017-03-25: qty 5

## 2017-03-25 MED ORDER — SODIUM CHLORIDE 0.9% FLUSH
10.0000 mL | Freq: Once | INTRAVENOUS | Status: AC
  Administered 2017-03-25: 10 mL
  Filled 2017-03-25: qty 10

## 2017-04-30 ENCOUNTER — Telehealth: Payer: Self-pay | Admitting: Hematology

## 2017-04-30 NOTE — Telephone Encounter (Signed)
Scheduled appt per 1/2 sch msg - spoke with patient regarding appts. Scheduled appt with Dr. Burr Medico due to a concern that she had with her breast.

## 2017-05-01 ENCOUNTER — Telehealth: Payer: Self-pay | Admitting: Emergency Medicine

## 2017-05-01 NOTE — Telephone Encounter (Signed)
Spoke with patient regarding receiving fax from [facility] for regarding several different brace request. Patient states that she does not want any of those braces and states that this company calls her constantly . Patient states that she does have knee pain and wanted a brace for that, but she states that she has an appointment set up with Dr. Volanda Napoleon and wants to wait to have her input. Advise patient that is best because some of those company's can scam patients. Patient understood and had no further questions regarding that. Also asked patient about needing Simvastatin medication and patient states she does not need a refill.

## 2017-05-02 ENCOUNTER — Telehealth: Payer: Self-pay | Admitting: Hematology

## 2017-05-02 ENCOUNTER — Ambulatory Visit: Payer: Medicare HMO | Admitting: Hematology

## 2017-05-02 NOTE — Telephone Encounter (Signed)
R/s appt per 1/4 sch message - Patient is aware of new appt date and time.

## 2017-05-05 ENCOUNTER — Ambulatory Visit: Payer: Medicare HMO | Admitting: Family Medicine

## 2017-05-05 NOTE — Progress Notes (Signed)
[place]  [phone] [phone]  Clinic Follow Up Note   Patient Care Team: [name], MD as PCP - General (Family Medicine)   Date of Service:  05/06/2017  CHIEF COMPLAINTS:  Follow up of Malignant neoplasm of UIQ right breast   Oncology History   Cancer Staging Breast cancer of upper-inner quadrant of right female breast [facility]) Staging form: Breast, AJCC 8th Edition - Pathologic stage from 08/02/2014: Stage IA (pT1c, pN0, cM0, G2, ER: Positive, PR: Positive, HER2: Positive) - Signed by Truitt Merle, MD on 01/04/2017 - Clinical: No stage assigned - Unsigned       Breast cancer of upper-inner quadrant of right female breast ([place])   05/25/2014 Mammogram    Suspicious lesion at 3 o'clock position in the right breast      06/15/2014 Breast US    Suspicious nodule, a spiculated hypoechoic mass, 1.1 cm at the 1 o'clock position      06/30/2014 Initial Biopsy    US Guided Core Biopsy: Invasive Ductal Carcinoma, [place]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 Tamera Punt at [facility] in [place], [place]      08/02/2014 Pathology Results    Right breast lumpectomy: Invasive ductal carcinoma, Duanne Moron Grade 2; size or largest invasive carcinoma 1.4 cm; Sentinel lymph nodes 2/2 free of tumor, margins free of tumor       09/22/2014 Echocardiogram    MUGA scan: normal ejection fraction of 57.7%      10/01/2014 - 10/03/2015 Adjuvant Chemotherapy    Adjuvant weekly Taxol from 05/04/58 to 09/30/99 complicated by fatigue, diarrhea, and leukopenia requiring treatment delays and  dosage adjustments. Records indicate she received 8 of 12 doses. Completed adjuvant Herceptin q3 weeks from 10/01/14 to 10/03/15.       06/12/2015 - 08/04/2015 Radiation Therapy    Received 4,860 cGy to right breast in 27 fractions Received 1,600 cGy boost in 8 fractions       09/12/2015 -  Anti-estrogen oral therapy    She began Arimidex after completing adjuvant radiation      06/25/2016 Imaging    CT Chest, Abdomen, Pelvis with contrast, compared with PET/CT on 04/25/2015 and CT 09/06/2014 - Impression: Chest: 1. Unchanged multiple subcentimeter pulmonary nodules bilaterally.there are no new pulmonary nodules. 2. No thoracic lymphadenopathy. 3. New right lung post radiation changes. 4. New diffuse right breast skin thickening suggestive of post radiation changes. Abdomen/pelvis 1. No new mass or lymphadenopathy in the abdomen and pelvis, no CT evidence of metastatic disease. 2. Unchanged adjacent soft tissue nodules inferior to the right hepatic lobe. 3. Colonic diverticulosis. 4. Status post hysterectomy.      06/29/2016 Mammogram    Digital bilateral mammogram with CAD and TOMO findings: the breast parenchyma demonstrates scattered fibroglandular densities. There are no suspicious calcifications, masses, architectural distortion or skin thickening. Surgical clips are present in the right breast at posterior depth. Vascular calcifications are present. No mammographic evidence of malignancy or referred is is gravida         HISTORY OF PRESENTING ILLNESS:  Jasmine Carlson 77 y.o. female is  here because of right breast cancer that was found on an annual screening mammogram on 05/25/2014 in [place]. A lump was not palpable. She had fatigue at the time of initial diagnosis. Pt was evaluated at [facility] with Medical Oncology and Radiation Oncology.   Pt had a mammogram completed on 05/25/2014 that showed suspicious lesion at the 3 o'clock position in the right breast. She  underwent US on 06/15/2014 that showed suspicious nodule, a spiculated, hypoechoic mass, 1.1 cm at the 1 o'clock position. 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 Pt had a right breast lumpectomy completed on 08/02/2014 with results revealing: Invasive ductal carcinoma, grade 2 with surgical margins free of carcinoma. Of the 2 lymphnodes excised, all were negative with superior, inferior, medial, and lateral margins negative. final stage was T1cN0 dx. ER 100%+, PR 75-80%+ and HER2/neu amplified by FISH. Pt had a DEXA scan completed on 09/06/2014: IMPRESSION: Increased activity noted in the cervical spine which is most likely secondary to degenerative changes. Followup study is recommended. No abnormal long bone activity. Degenerative changes noted in the knees and feet. Pt also underwent a CT chest on 09/06/14 with results revealing:  IMPRESSION: 1. Multiple pulmonary nodules bilaterally up to 5 mm are nonspecific and metastases cannot be excluded. Recommend a close f/u chest CT in 3 months to assess stability. 2. No thoracic lymphadenopathy. 3. Soft tissue mass in the right breast as described is consistent with the known primary malignancy. Pt had a follow up CT CAP completed on 09/13/14 with results: IMPRESSION: Adjacent soft tissue nodules inferior to the right hepatic lobe posteriorly up to 0.9 cm are nonspecific. Continued CT followup is recommended in this patient with breast cancer. 2. There is otherwise no mass or LAD in the abdomen and pelvis. 3. Colonic diverticulosis. 4. status post hysterectomy.   She completed chemotherapy with Taxol and Herceptin for 3 months, then completed single agent Herceptin on 10/03/2015. She remembers tolerating this well without persistent side effects.She was treated with adjuvant external beam radiation from 06/12/2015 to 08/04/2015. She began adjuvant endocrine  therapy on Arimidex after radiation.  Pt had a follow up CT chest with contrast completed on 06/25/2016 with results revealing: IMPRESSION: 1. Unchanged multiple subcentimeter pulmonary nodules bilaterally. there is no new pulmonary nodules. 2. No thoracic LAD. 3. new right lung post radiation changes. 4. New diffuse right breast skin thicking suggestive of postradiation changes.   Jasmine Carlson presents to the office today as scheduled. She is tolerating Arimidex well. She has some joint aches particularly in the knees and mostly while ambulating for distances and walking upstairs. She currently drives but is without a vehicle at this time. She has forms for [place] to fill out to qualify for a SCAT. She moved here in August from [place] to be near family. Her last breast physical and breast exam was 3 months ago and was normal per patient. She completes self breast exams. Her port has not been flushed since July and is asking for a flush today.  Prior to today's appointment, the patient presented to the ED on 12/06/2016 for dizziness. She experienced 2 falls without syncope. She had labs drawn and an IV inserted the left without being evaluated. She has not had any episodes since that time. She denies drinking well at that time since she was moving, and may have been dehydrated.her creatinine in the ED  was 1.3, we'll recheck that today. She has increased her liquid intake since then. 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 I do not have the report of her evaluation and treatment for that episode.She denies bleeding and has no known history of blood clots. She needs to establish with a PCP. She notes she ran out of losartan a couple days ago.  Pt reports that she was in the ED on 12/06/2016 for dizziness and 2 prior falls without syncope. She denies any recent episodes of dizziness and reports that she may have been dehydrated at the time. She notes that she now consumes more liquids. Per pt chart  review, pt LWBS after triage on 12/06/16 and was not evaluated for her dizziness. She denies taking any new medications recently. Pt reports that she takes Plavix and denies hx of blood clots. Pt reports that she is out of her losartan a couple days ago. She notes that she had a mini-stroke 3-4 years ago and will need to obtain a PCP.   On review of systems, pt denies fever, chills, decreased appetite, decreased energy levels. Denies hot flashes, night sweats, mood swings, or depression. Reports weight gain. Denies bleeding or vaginal bleeding. Denies extremity swelling. Denies breast pain, nipple discharge, or breast dimpling. Reports intermittent left foot tingling. Denies numbness. Denies CP, cough, SOB.  Denies pain. Pt denies abdominal pain, nausea, vomiting, constipation, or diarrhea. Denies HA, vision changes, slurred speech, or weakness.   CURRENT THERAPY  Anastrozole 37m daily starting 09/12/15   INTERVAL HISTORY  RColumbia Pandeyis here for a follow up. She was last seen by me 4 months ago, and she requested to be seen sooner due to her concern of a possible right breast lump and mild tenderness which she noticed a few weeks ago. 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 She has irritation in her right arm and breast and wondered where this is coming from. Her PCP had her arm xray previously. She denies pain but notes tenderness in that area. She notes she has been wearing a compression sleeve and has been stretching. She has not gone to PT before. She requests a refill for her anastrozole which she is tolerating well. She is overall doing well.     MEDICAL HISTORY:  Past Medical History:  Diagnosis Date  . Cancer ([facility]    breast  . Diabetes mellitus without complication ([facility]   . Hyperlipidemia   . Hypertension    SURGICAL HISTORY: Past Surgical History:  Procedure Laterality Date  . ABDOMINAL HYSTERECTOMY     30 years ago  . BREAST LUMPECTOMY Right  08/02/2014    SOCIAL HISTORY: Social History   Socioeconomic History  . Marital status: Single    Spouse name: Not on file  . Number of children: Not on file  . Years of education: Not on file  . Highest education level: Not on file  Social Needs  . Financial resource strain: Not on file  . Food insecurity - worry: Not on file  . Food insecurity - inability: Not on file  . Transportation needs - medical: Not on file  . Transportation needs - non-medical: Not on file  Occupational History  . Not on file  Tobacco Use  . Smoking status: Never Smoker  . Smokeless tobacco: Never Used  Substance and Sexual Activity  . Alcohol use: No    Comment: Pt quit drinking a long  time ago.  . Drug use: No  . Sexual activity: No  Other Topics Concern  . Not on file  Social History Narrative  . Not on file   Denies recent ETOH use, denies smoking cigarettes in the past. She notes that her sister has a hx of breast cancer that was diagnosed post-menopausal. Pt reports that her father has a hx of throat cancer.  Pt states that she has 3 living sons (1 son is currently on disability) and 2 daughters that passed away, one from drug overdose and one AIDS.  FAMILY HISTORY: Family History  Problem Relation Age of Onset  . Cancer Father        throat  . Cancer Sister        breast cancer; postmenopausal; double mastectomy    ALLERGIES:  has No Known Allergies.  MEDICATIONS:  Current Outpatient Medications  Medication Sig Dispense Refill  . anastrozole (ARIMIDEX) 1 MG tablet Take 1 tablet (1 mg total) by mouth daily. 90 tablet 4  . carvedilol (COREG) 6.25 MG tablet Take 1 tablet (6.25 mg total) by mouth 2 (two) times daily with a meal. 180 tablet 3  . clopidogrel (PLAVIX) 75 MG tablet Take 1 tablet (75 mg total) by mouth daily. 90 tablet 2  . losartan (COZAAR) 100 MG tablet Take 1 tablet (100 mg total) by mouth daily. 90 tablet 3  . metFORMIN (GLUCOPHAGE) 500 MG tablet Take 1 tablet (500  mg total) by mouth daily with supper. 90 tablet 3  . simvastatin (ZOCOR) 40 MG tablet Take 1 tablet (40 mg total) by mouth every evening. 90 tablet 2   No current facility-administered medications for this visit.     REVIEW OF SYSTEMS:  Constitutional: Denies fevers, chills, abnormal night sweats, decreased appetite, or decreased energy levels. Denies hot flashes, mood swings. (+) weight gain Eyes: Denies blurriness of vision, double vision or watery eyes Ears, nose, mouth, throat, and face: Denies mucositis or sore throat Respiratory: Denies cough, dyspnea or wheezes. No SOB. Cardiovascular: Denies palpitation, chest discomfort or lower extremity swelling Gastrointestinal:  Denies nausea, heartburn or change in bowel habits Skin: Denies abnormal skin rashes Musculoskeletal: (+) arthritis in right arm, tenderness in right arm and breast Lymphatics: Denies easy bruising (+) mild right arm lymphedema  Neurological:Denies numbness, tingling or new weaknesses Behavioral/Psych: Mood is stable, no new changes  All other systems were reviewed with the patient and are negative.   PHYSICAL EXAMINATION: ECOG PERFORMANCE STATUS: 0 - Asymptomatic  Vitals:   05/06/17 0916  BP: (!) 180/89  Pulse: 89  Resp: 18  Temp: 98 F (36.7 C)  SpO2: 99%   Filed Weights   05/06/17 0916  Weight: 173 lb 8 oz (78.7 kg)    GENERAL:alert, no distress and comfortable SKIN: skin color, texture, turgor are normal, no rashes or significant lesions EYES: normal, conjunctiva are pink and non-injected, sclera clear OROPHARYNX:no exudate, no erythema and lips, buccal mucosa, and tongue normal  NECK: supple, thyroid normal size, non-tender, without nodularity LYMPH:  no palpable lymphadenopathy in the cervical, axillary or inguinal (+) mild right arm lymphedema  LUNGS: clear to auscultation and percussion with normal breathing effort HEART: regular rate & rhythm and no murmurs and no lower extremity  edema ABDOMEN:abdomen soft, non-tender and normal bowel sounds Musculoskeletal:no cyanosis of digits and no clubbing  PSYCH: alert & oriented x 3 with fluent speech NEURO: no focal motor/sensory deficits BREAST: Breast inspection showed them to be symmetrical with no nipple discharge. Palpation  of the breasts and axilla revealed no obvious mass that I could appreciate.  S/p right lumpectomy: Surgical scars well healed, no significant scar tissue.    LABORATORY DATA:  I have reviewed the data as listed CBC Latest Ref Rng & Units 12/06/2016  WBC 4.0 - 10.5 K/uL 6.5  Hemoglobin 12.0 - 15.0 g/dL 11.3(L)  Hematocrit 36.0 - 46.0 % 35.1(L)  Platelets 150 - 400 K/uL 192   CMP Latest Ref Rng & Units 01/02/2017 12/06/2016  Glucose 70 - 140 mg/dl 118 148(H)  BUN 7.0 - 26.0 mg/dL 14.2 26(H)  Creatinine 0.6 - 1.1 mg/dL 1.1 1.30(H)  Sodium 136 - 145 mEq/L 138 135  Potassium 3.5 - 5.1 mEq/L 4.3 4.3  Chloride 101 - 111 mmol/L - 102  CO2 22 - 29 mEq/L 24 23  Calcium 8.4 - 10.4 mg/dL 9.8 8.9  Total Protein 6.4 - 8.3 g/dL 7.4 -  Total Bilirubin 0.20 - 1.20 mg/dL 0.34 -  Alkaline Phos 40 - 150 U/L 82 -  AST 5 - 34 U/L 15 -  ALT 0 - 55 U/L 11 -   PATHOLOGY:   US guided needle core biopsy 06/30/2014 Invasive ductal carcinoma, grade 2, ER+ 100%, PR+ 75-80%, and HER2/neu FISH+.   Right breast lumpectomy 08/02/2014 Invasive ductal carcinoma, grade 2 with surgical margins free of carcinoma. Of the 2 lymphnodes excised, all were negative with superior, inferior, medial, and lateral margins negative. final stage was T1cN0 dx. ER 100%+, PR 75-80%+ and HER2/neu amplified by FISH.  RADIOGRAPHIC STUDIES: I have personally reviewed the radiological images as listed and agreed with the findings in the report. No results found.   Mammogram 05/25/2014 Suspicious lesion at the 3 o'clock position in the right breast.    06/15/14  Suspicious nodule, a spiculated, hypoechoic mass, 1.1 cm at the 1 o'clock position.    DEXA scan 09/06/2014 IMPRESSION: Increased activity noted in the cervical spine which is most likely secondary to degenerative changes. Followup study is recommended. No abnormal long bone activity. Degenerative changes noted in the knees and feet.   CT chest 09/06/14  IMPRESSION: 1. Multiple pulmonary nodules bilaterally up to 5 mm are nonspecific and metastases cannot be excluded. Recommend a close f/u chest CT in 3 months to assess stability. 2. No thoracic lymphadenopathy. 3. Soft tissue mass in the right breast as described is consistent with the known primary malignancy.   CT CAP 09/13/14  IMPRESSION: Adjacent soft tissue nodules inferior to the right hepatic lobe posteriorly up to 0.9 cm are nonspecific. Continued CT followup is recommended in this patient with breast cancer. 2. There is otherwise no mass or LAD in the abdomen and pelvis. 3. Colonic diverticulosis. 4. status post hysterectomy.   CT chest with contrast 06/25/2016  IMPRESSION: 1. unchanged multiple subcentimeter pulmonary nodules bilaterally. there is no new pulmonary nodules. 2. No thoracic LAD. 3. new right lung post radiation changes. 4. new diffuse right breast skin thicking suggestive of postradiation changes.   ASSESSMENT & PLAN: 77 y.o. african-american female, with a PMHx of breast cancer, DM, and HTN presented with abnormal finding on screening mammogram in 05/25/2014. Pt was treated with radiation therapy 06/12/2015-08/04/2015 (external beam radiation therapy) and chemotherapy of taxol (3 months), herceptin (1 year), and is currently on anastrozole.  1. 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 Unfortunately the clinic notes from her oncologist were not available during  her visit with [place], I will request again  -We reviewed her pathology and radiology results in detail. -she had lumpectomy  and sentinel lymph node biopsy with negative margins in 07/2014, adjuvant chemotherapy 09/2014-09/2015, radiation in 2017, and now on adjuvant anastrozole since 09/12/15 -She is tolerating letrozole very well, we'll continue, for total 5-7 years. -We discussed the risk of cancer recurrence. Given her early stage HER-2 positive disease, she has moderate risk for recurrence. -Continue breast cancer surveillance. She is compliant with annual mammogram, self exam, and I encouraged her to follow-up with [place] every 6 months. -I encouraged her to have healthy diet and exercise regularly -She is clinically doing well. Her physical exam was unremarkable.  She is concerned about a possible lump in the right breast, my exam was negative today. There is no clinical concern for recurrence. -Continue Anastrozole, refilled today  -Continue Breast Surveillance, next Mammogram 06/2017 -F/u in 09/2017    2. Genetics -due to family history I have recommended that she be seen by our genetic counselor for counseling and for possible genetic testing to ruled out inheritable cancer syndrome.  3. Bone Health -Her last bone density scan was normal per patient -We discussed the potential side effects of osteopenia and osteoporosis from letrozole -I encouraged her to continue calcium and vitamin D supplement -We'll repeat a bone density scan 06/2017.  4. HTN and dizziness -She had a few episodes of dizziness a few months ago, possible related to her dehydration -I encouraged her to find a primary care physician and f/u   Plan: -Continue anastrozole, refilled today -Please schedule her mamm[facility] and [facility] in 06/2017  -Please move her lab and f/u from march to June 2019    No orders of the defined types were placed in this encounter.   All questions were answered. The patient knows to call the clinic with any problems, questions or concerns. I spent 20 minutes counseling the patient face to face. The total time spent in  the appointment was 25 minutes and more than 50% was on counseling.     Truitt Merle, MD 05/06/2017   This document serves as a record of services personally performed by Truitt Merle, MD. It was created on her behalf by Joslyn Devon, a trained medical scribe. The creation of this record is based on the scribe's personal observations and the provider's statements to them.    I have reviewed the above documentation for accuracy and completeness, and I agree with the above.

## 2017-05-06 ENCOUNTER — Encounter: Payer: Self-pay | Admitting: Hematology

## 2017-05-06 ENCOUNTER — Telehealth: Payer: Self-pay | Admitting: Hematology

## 2017-05-06 ENCOUNTER — Inpatient Hospital Stay (HOSPITAL_BASED_OUTPATIENT_CLINIC_OR_DEPARTMENT_OTHER): Payer: Medicare HMO | Admitting: Hematology

## 2017-05-06 ENCOUNTER — Inpatient Hospital Stay: Payer: Medicare HMO | Attending: Hematology

## 2017-05-06 VITALS — BP 180/89 | HR 89 | Temp 98.0°F | Resp 18 | Ht 62.5 in | Wt 173.5 lb

## 2017-05-06 DIAGNOSIS — K5730 Diverticulosis of large intestine without perforation or abscess without bleeding: Secondary | ICD-10-CM | POA: Insufficient documentation

## 2017-05-06 DIAGNOSIS — I10 Essential (primary) hypertension: Secondary | ICD-10-CM | POA: Insufficient documentation

## 2017-05-06 DIAGNOSIS — Z90710 Acquired absence of both cervix and uterus: Secondary | ICD-10-CM | POA: Diagnosis not present

## 2017-05-06 DIAGNOSIS — R42 Dizziness and giddiness: Secondary | ICD-10-CM | POA: Diagnosis not present

## 2017-05-06 DIAGNOSIS — Z800 Family history of malignant neoplasm of digestive organs: Secondary | ICD-10-CM

## 2017-05-06 DIAGNOSIS — Z1501 Genetic susceptibility to malignant neoplasm of breast: Secondary | ICD-10-CM

## 2017-05-06 DIAGNOSIS — Z79899 Other long term (current) drug therapy: Secondary | ICD-10-CM | POA: Insufficient documentation

## 2017-05-06 DIAGNOSIS — Z170 Estrogen receptor positive status [ER+]: Secondary | ICD-10-CM | POA: Insufficient documentation

## 2017-05-06 DIAGNOSIS — R918 Other nonspecific abnormal finding of lung field: Secondary | ICD-10-CM | POA: Diagnosis not present

## 2017-05-06 DIAGNOSIS — Z79811 Long term (current) use of aromatase inhibitors: Secondary | ICD-10-CM

## 2017-05-06 DIAGNOSIS — Z9221 Personal history of antineoplastic chemotherapy: Secondary | ICD-10-CM | POA: Insufficient documentation

## 2017-05-06 DIAGNOSIS — E119 Type 2 diabetes mellitus without complications: Secondary | ICD-10-CM | POA: Insufficient documentation

## 2017-05-06 DIAGNOSIS — Z452 Encounter for adjustment and management of vascular access device: Secondary | ICD-10-CM | POA: Insufficient documentation

## 2017-05-06 DIAGNOSIS — E785 Hyperlipidemia, unspecified: Secondary | ICD-10-CM | POA: Diagnosis not present

## 2017-05-06 DIAGNOSIS — Z923 Personal history of irradiation: Secondary | ICD-10-CM

## 2017-05-06 DIAGNOSIS — C50211 Malignant neoplasm of upper-inner quadrant of right female breast: Secondary | ICD-10-CM

## 2017-05-06 DIAGNOSIS — M1990 Unspecified osteoarthritis, unspecified site: Secondary | ICD-10-CM | POA: Insufficient documentation

## 2017-05-06 DIAGNOSIS — Z7984 Long term (current) use of oral hypoglycemic drugs: Secondary | ICD-10-CM

## 2017-05-06 DIAGNOSIS — Z803 Family history of malignant neoplasm of breast: Secondary | ICD-10-CM

## 2017-05-06 MED ORDER — ANASTROZOLE 1 MG PO TABS: 1.0000 mg | ORAL_TABLET | Freq: Every day | ORAL | 4 refills | Status: DC

## 2017-05-06 MED ORDER — SODIUM CHLORIDE 0.9% FLUSH
10.0000 mL | Freq: Once | INTRAVENOUS | Status: AC
  Administered 2017-05-06: 10 mL
  Filled 2017-05-06: qty 10

## 2017-05-06 MED ORDER — HEPARIN SOD (PORK) LOCK FLUSH 100 UNIT/ML IV SOLN
250.0000 [IU] | Freq: Once | INTRAVENOUS | Status: AC
  Administered 2017-05-06: 250 [IU]
  Filled 2017-05-06: qty 5

## 2017-05-06 NOTE — Telephone Encounter (Signed)
Gave avs and calendar for June however mammo and dexa could not be schedule. The breast center said they needed her films from previous doctor first.

## 2017-05-12 ENCOUNTER — Ambulatory Visit (INDEPENDENT_AMBULATORY_CARE_PROVIDER_SITE_OTHER): Payer: Medicare HMO | Admitting: Family Medicine

## 2017-05-12 ENCOUNTER — Encounter: Payer: Self-pay | Admitting: Family Medicine

## 2017-05-12 VITALS — BP 140/70 | HR 78 | Temp 98.3°F | Wt 174.5 lb

## 2017-05-12 DIAGNOSIS — M19019 Primary osteoarthritis, unspecified shoulder: Secondary | ICD-10-CM | POA: Diagnosis not present

## 2017-05-12 DIAGNOSIS — M1711 Unilateral primary osteoarthritis, right knee: Secondary | ICD-10-CM | POA: Diagnosis not present

## 2017-05-12 DIAGNOSIS — M1712 Unilateral primary osteoarthritis, left knee: Secondary | ICD-10-CM | POA: Diagnosis not present

## 2017-05-12 MED ORDER — DICLOFENAC SODIUM 1 % TD GEL: 2.0000 g | Freq: Four times a day (QID) | TRANSDERMAL | 6 refills | Status: DC | PRN

## 2017-05-12 NOTE — Patient Instructions (Addendum)
An xray of both knees was ordered on October 10th.  When you have a chance please stop by [facility] to have these done.  A prescription for the gel to put on your knees and shoulder was sent to [facility] mail order pharmacy. Arthritis Arthritis means joint pain. It can also mean joint disease. A joint is a place where bones come together. People who have arthritis may have:  Red joints.  Swollen joints.  Stiff joints.  Warm joints.  A fever.  A feeling of being sick.  Follow these instructions at home: Pay attention to any changes in your symptoms. Take these actions to help with your pain and swelling. Medicines  Take over-the-counter and prescription medicines only as told by your doctor.  Do not take aspirin for pain if your doctor says that you may have gout. Activity  Rest your joint if your doctor tells you to.  Avoid activities that make the pain worse.  Exercise your joint regularly as told by your doctor. Try doing exercises like: ? Swimming. ? Water aerobics. ? Biking. ? Walking. Joint Care   If your joint is swollen, keep it raised (elevated) if told by your doctor.  If your joint feels stiff in the morning, try taking a warm shower.  If you have diabetes, do not apply heat without asking your doctor.  If told, apply heat to the joint: ? Put a towel between the joint and the hot pack or heating pad. ? Leave the heat on the area for 20-30 minutes.  If told, apply ice to the joint: ? Put ice in a plastic bag. ? Place a towel between your skin and the bag. ? Leave the ice on for 20 minutes, 2-3 times per day.  Keep all follow-up visits as told by your doctor. Contact a doctor if:  The pain gets worse.  You have a fever. Get help right away if:  You have very bad pain in your joint.  You have swelling in your joint.  Your joint is red.  Many joints become painful and swollen.  You have very bad back pain.  Your leg is very  weak.  You cannot control your pee (urine) or poop (stool). This information is not intended to replace advice given to you by your health care provider. Make sure you discuss any questions you have with your health care provider. Document Released: 07/10/2009 Document Revised: 09/21/2015 Document Reviewed: 07/11/2014 Elsevier Interactive Patient Education  [employer].

## 2017-05-12 NOTE — Progress Notes (Signed)
Subjective:    Patient ID: Jasmine Carlson, female    DOB: 1940-08-11, 77 y.o.   MRN: [id number]  Chief Complaint  Patient presents with  . Follow-up    HPI Patient was seen today for f/u.  Pt had xray of R shoulder done on 02/28/17.  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  Pt was suppose to have xray of b/l knees done, but states she was told there was no order for this in the computer.   Pt has been taking Tylenol prn for shoulder and knee pain.  She has been doing shoulder exercises at home.  Pt went to PT.  Pt states she wants to go walking, but she lives in a 2nd floor apt.  The stairs cause pt to have knee pain which makes it difficult for her to get out.  Pt is considering using Osteobiflex.  She has a coupon with her for $10 off.  Past Medical History:  Diagnosis Date  . Cancer ([place])    breast  . Diabetes mellitus without complication ([place])   . Hyperlipidemia   . Hypertension     No Known Allergies  ROS General: Denies fever, chills, night sweats, changes in weight, changes in appetite HEENT: Denies headaches, ear pain, changes in vision, rhinorrhea, sore throat CV: Denies CP, palpitations, SOB, orthopnea Pulm: Denies SOB, cough, wheezing GI: Denies abdominal pain, nausea, vomiting, diarrhea, constipation GU: Denies dysuria, hematuria, frequency, vaginal discharge Msk: Denies muscle cramps, joint pains  +b/l knee pain, R shoulder pain 2/2 arthritis Neuro: Denies weakness, numbness, tingling Skin: Denies rashes, bruising Psych: Denies depression, anxiety, hallucinations     Objective:    Blood pressure 140/70, pulse 78, temperature 98.3 F (36.8 C), temperature source Oral, weight 174 lb 8 oz (79.2 kg).   Gen. Pleasant, well-nourished, in no distress, normal affect   HEENT: [place]/AT, face symmetric, no scleral icterus, PERRLA, nares patent without drainage Lungs: no accessory muscle use, CTAB, no wheezes or rales Cardiovascular: RRR, no m/r/g, no  peripheral edema Musculoskeletal: No deformities, no cyanosis or clubbing, normal tone. Crepitus of b/l knees, No TTP or effusion of b/l knees.   Neuro:  A&Ox3, CN II-XII intact, normal gait    Wt Readings from Last 3 Encounters:  05/12/17 174 lb 8 oz (79.2 kg)  05/06/17 173 lb 8 oz (78.7 kg)  02/05/17 168 lb 4.8 oz (76.3 kg)    Lab Results  Component Value Date   WBC 6.5 12/06/2016   HGB 11.3 (L) 12/06/2016   HCT 35.1 (L) 12/06/2016   PLT 192 12/06/2016   GLUCOSE 118 01/02/2017   ALT 11 01/02/2017   AST 15 01/02/2017   NA 138 01/02/2017   K 4.3 01/02/2017   CL 102 12/06/2016   CREATININE 1.1 01/02/2017   BUN 14.2 01/02/2017   CO2 24 01/02/2017    Assessment/Plan:  Shoulder arthritis  -Continue shoulder exercises. -ok to use heat prn. -if pain continues consider steroid injection. - Plan: diclofenac sodium (VOLTAREN) 1 % GEL  Arthritis of left knee  -Advised to have knee xrays done when possible.  Unable to call xray to clarify if they can see the standing order or not 2/2 phones not working in the office today.  -If needed can reorder imaging. - Plan: diclofenac sodium (VOLTAREN) 1 % GEL  Arthritis of right knee  -Advised to have knee xrays done when possible.  Unable to call xray to clarify if they can see the standing order  or not 2/2 phones not working in the office today.  -If needed can reorder imaging. -Given handouts on chair exercises for strength and flexibility. - Plan: diclofenac sodium (VOLTAREN) 1 % GEL .  F/u in 1-2 months to re-assess knee pain.    Grier Mitts, MD

## 2017-05-19 ENCOUNTER — Telehealth: Payer: Self-pay | Admitting: Family Medicine

## 2017-05-19 NOTE — Telephone Encounter (Signed)
Copied from [place] [id number]. Topic: Quick Communication - See Telephone Encounter >> May 19, 2017 10:09 AM Percell Belt A wrote: CRM for notification. See Telephone encounter for:  Jasmine Carlson with [employer] .  She has some question about the diclofenac sodium (VOLTAREN) 1 % GEL [[id number]]    05/19/17.

## 2017-05-19 NOTE — Telephone Encounter (Signed)
Spoke with Thurmond Butts from the [place] office with [facility] regarding Diclofenac. Per Thurmond Butts this medication has been approved and will be sent out to patient pharmacy. Nothing further needed.

## 2017-05-19 NOTE — Telephone Encounter (Unsigned)
Copied from [place] [id number]. Topic: Quick Communication - See Telephone Encounter >> May 19, 2017 10:09 AM Percell Belt A wrote: CRM for notification. See Telephone encounter for:  Jasmine Carlson with [employer] .  She has some question about the diclofenac sodium (VOLTAREN) 1 % GEL [[id number]] .  There is an PA started for this med and she has some question regarding it   Best number [phone]   05/19/17.

## 2017-05-22 ENCOUNTER — Observation Stay
Admission: EM | Admit: 2017-05-22 | Discharge: 2017-05-24 | Disposition: A | Discharge status: Home / Self Care | Payer: Medicare (Managed Care) | Attending: Internal Medicine | Admitting: Internal Medicine

## 2017-05-22 DIAGNOSIS — Z7984 Long term (current) use of oral hypoglycemic drugs: Secondary | ICD-10-CM | POA: Insufficient documentation

## 2017-05-22 DIAGNOSIS — R55 Syncope and collapse: Secondary | ICD-10-CM | POA: Insufficient documentation

## 2017-05-22 DIAGNOSIS — R109 Unspecified abdominal pain: Secondary | ICD-10-CM | POA: Insufficient documentation

## 2017-05-22 DIAGNOSIS — K921 Melena: Principal | ICD-10-CM | POA: Insufficient documentation

## 2017-05-22 DIAGNOSIS — E119 Type 2 diabetes mellitus without complications: Secondary | ICD-10-CM | POA: Insufficient documentation

## 2017-05-22 DIAGNOSIS — I10 Essential (primary) hypertension: Secondary | ICD-10-CM | POA: Insufficient documentation

## 2017-05-22 DIAGNOSIS — Z8673 Personal history of transient ischemic attack (TIA), and cerebral infarction without residual deficits: Secondary | ICD-10-CM | POA: Insufficient documentation

## 2017-05-22 DIAGNOSIS — N179 Acute kidney failure, unspecified: Secondary | ICD-10-CM | POA: Insufficient documentation

## 2017-05-22 DIAGNOSIS — D62 Acute posthemorrhagic anemia: Secondary | ICD-10-CM | POA: Insufficient documentation

## 2017-05-22 DIAGNOSIS — Z7902 Long term (current) use of antithrombotics/antiplatelets: Secondary | ICD-10-CM | POA: Insufficient documentation

## 2017-05-22 DIAGNOSIS — Z743 Need for continuous supervision: Secondary | ICD-10-CM | POA: Diagnosis not present

## 2017-05-22 DIAGNOSIS — D649 Anemia, unspecified: Secondary | ICD-10-CM | POA: Diagnosis not present

## 2017-05-22 DIAGNOSIS — K922 Gastrointestinal hemorrhage, unspecified: Secondary | ICD-10-CM | POA: Diagnosis not present

## 2017-05-22 LAB — CBC AND DIFFERENTIAL
Absolute NRBC: 0 10*3/uL
Basophils Absolute Automated: 0.02 10*3/uL (ref 0.00–0.20)
Basophils Automated: 0.2 %
Eosinophils Absolute Automated: 0.1 10*3/uL (ref 0.00–0.70)
Eosinophils Automated: 1 %
Hematocrit: 33.8 % — ABNORMAL LOW (ref 37.0–47.0)
Hgb: 10.5 g/dL — ABNORMAL LOW (ref 12.0–16.0)
Immature Granulocytes Absolute: 0.03 10*3/uL
Immature Granulocytes: 0.3 %
Lymphocytes Absolute Automated: 1.07 10*3/uL (ref 0.50–4.40)
Lymphocytes Automated: 11.2 %
MCH: 26.6 pg — ABNORMAL LOW (ref 28.0–32.0)
MCHC: 31.1 g/dL — ABNORMAL LOW (ref 32.0–36.0)
MCV: 85.6 fL (ref 80.0–100.0)
MPV: 11.4 fL (ref 9.4–12.3)
Monocytes Absolute Automated: 0.65 10*3/uL (ref 0.00–1.20)
Monocytes: 6.8 %
Neutrophils Absolute: 7.69 10*3/uL (ref 1.80–8.10)
Neutrophils: 80.5 %
Nucleated RBC: 0 /100 WBC (ref 0.0–1.0)
Platelets: 205 10*3/uL (ref 140–400)
RBC: 3.95 10*6/uL — ABNORMAL LOW (ref 4.20–5.40)
RDW: 13 % (ref 12–15)
WBC: 9.56 10*3/uL (ref 3.50–10.80)

## 2017-05-22 LAB — COMPREHENSIVE METABOLIC PANEL
ALT: 9 U/L (ref 0–55)
AST (SGOT): 10 U/L (ref 5–34)
Albumin/Globulin Ratio: 1.2 (ref 0.9–2.2)
Albumin: 3.4 g/dL — ABNORMAL LOW (ref 3.5–5.0)
Alkaline Phosphatase: 103 U/L (ref 37–106)
Anion Gap: 11 (ref 5.0–15.0)
BUN: 24 mg/dL — ABNORMAL HIGH (ref 7.0–19.0)
Bilirubin, Total: 0.4 mg/dL (ref 0.2–1.2)
CO2: 22 mEq/L (ref 22–29)
Calcium: 9.2 mg/dL (ref 7.9–10.2)
Chloride: 104 mEq/L (ref 100–111)
Creatinine: 1.4 mg/dL — ABNORMAL HIGH (ref 0.6–1.0)
Globulin: 2.8 g/dL (ref 2.0–3.6)
Glucose: 433 mg/dL — ABNORMAL HIGH (ref 70–100)
Potassium: 5.1 mEq/L (ref 3.5–5.1)
Protein, Total: 6.2 g/dL (ref 6.0–8.3)
Sodium: 137 mEq/L (ref 136–145)

## 2017-05-22 LAB — PT AND APTT
PT INR: 1.1 (ref 0.9–1.1)
PT: 14 s (ref 12.6–15.0)
PTT: 22 s — ABNORMAL LOW (ref 23–37)

## 2017-05-22 LAB — TYPE AND SCREEN
AB Screen Gel: NEGATIVE
ABO Rh: A NEG

## 2017-05-22 LAB — GFR: EGFR: 44

## 2017-05-22 LAB — GLUCOSE WHOLE BLOOD - POCT: Whole Blood Glucose POCT: 316 mg/dL — ABNORMAL HIGH (ref 70–100)

## 2017-05-22 LAB — HEMOLYSIS INDEX: Hemolysis Index: 5 (ref 0–18)

## 2017-05-22 MED ORDER — SODIUM CHLORIDE 0.9 % IV BOLUS
1000.00 mL | Freq: Once | INTRAVENOUS | Status: AC
Start: 2017-05-22 — End: 2017-05-22
  Administered 2017-05-22: 17:00:00 1000 mL via INTRAVENOUS

## 2017-05-22 MED ORDER — ACETAMINOPHEN 650 MG RE SUPP
650.00 mg | RECTAL | Status: DC | PRN
Start: 2017-05-22 — End: 2017-05-24

## 2017-05-22 MED ORDER — SODIUM CHLORIDE 0.9 % IV SOLN
INTRAVENOUS | Status: AC
Start: 2017-05-22 — End: 2017-05-23

## 2017-05-22 MED ORDER — LOSARTAN POTASSIUM 25 MG PO TABS
100.00 mg | ORAL_TABLET | Freq: Every day | ORAL | Status: DC
Start: 2017-05-23 — End: 2017-05-24
  Administered 2017-05-23 – 2017-05-24 (×2): 100 mg via ORAL
  Filled 2017-05-22 (×2): qty 4

## 2017-05-22 MED ORDER — GLUCOSE 40 % PO GEL
15.00 g | ORAL | Status: DC | PRN
Start: 2017-05-22 — End: 2017-05-24

## 2017-05-22 MED ORDER — NALOXONE HCL 0.4 MG/ML IJ SOLN (WRAP)
0.20 mg | INTRAMUSCULAR | Status: DC | PRN
Start: 2017-05-22 — End: 2017-05-24

## 2017-05-22 MED ORDER — INSULIN LISPRO 100 UNIT/ML SC SOLN
1.00 [IU] | Freq: Every evening | SUBCUTANEOUS | Status: DC | PRN
Start: 2017-05-22 — End: 2017-05-24
  Administered 2017-05-22: 22:00:00 2 [IU] via SUBCUTANEOUS
  Administered 2017-05-23: 21:00:00 1 [IU] via SUBCUTANEOUS
  Filled 2017-05-22: qty 3
  Filled 2017-05-22: qty 6

## 2017-05-22 MED ORDER — ACETAMINOPHEN 325 MG PO TABS
650.00 mg | ORAL_TABLET | ORAL | Status: DC | PRN
Start: 2017-05-22 — End: 2017-05-24

## 2017-05-22 MED ORDER — GLUCAGON 1 MG IJ SOLR (WRAP)
1.00 mg | INTRAMUSCULAR | Status: DC | PRN
Start: 2017-05-22 — End: 2017-05-24

## 2017-05-22 MED ORDER — INSULIN LISPRO 100 UNIT/ML SC SOLN
1.00 [IU] | Freq: Three times a day (TID) | SUBCUTANEOUS | Status: DC | PRN
Start: 2017-05-22 — End: 2017-05-24
  Administered 2017-05-23: 17:00:00 2 [IU] via SUBCUTANEOUS
  Administered 2017-05-24: 10:00:00 1 [IU] via SUBCUTANEOUS
  Filled 2017-05-22: qty 3
  Filled 2017-05-22: qty 6

## 2017-05-22 MED ORDER — DEXTROSE 50 % IV SOLN
12.50 g | INTRAVENOUS | Status: DC | PRN
Start: 2017-05-22 — End: 2017-05-24

## 2017-05-22 MED ORDER — ONDANSETRON HCL 4 MG/2ML IJ SOLN
4.00 mg | Freq: Four times a day (QID) | INTRAMUSCULAR | Status: DC | PRN
Start: 2017-05-22 — End: 2017-05-24

## 2017-05-22 MED ORDER — SIMVASTATIN 40 MG PO TABS
40.00 mg | ORAL_TABLET | Freq: Every evening | ORAL | Status: DC
Start: 2017-05-22 — End: 2017-05-23
  Administered 2017-05-22: 22:00:00 40 mg via ORAL
  Filled 2017-05-22: qty 1

## 2017-05-22 MED ORDER — ONDANSETRON 4 MG PO TBDP
4.00 mg | ORAL_TABLET | Freq: Four times a day (QID) | ORAL | Status: DC | PRN
Start: 2017-05-22 — End: 2017-05-24

## 2017-05-22 MED ORDER — CARVEDILOL 6.25 MG PO TABS
6.25 mg | ORAL_TABLET | Freq: Two times a day (BID) | ORAL | Status: DC
Start: 2017-05-23 — End: 2017-05-24
  Administered 2017-05-23 – 2017-05-24 (×3): 6.25 mg via ORAL
  Filled 2017-05-22 (×3): qty 1

## 2017-05-22 NOTE — H&P (Signed)
[facility]      Patient: Carrie Schroeder  Date: 05/22/2017   DOB: 03-30-1941  Admission Date: 05/22/2017   MRN: [id number]  Attending: Jolyn Lent         Chief Complaint   Patient presents with   . Rectal Bleeding      History Gathered From: the patient and ER physician     HISTORY AND PHYSICAL     Carrie Schroeder is a 77 y.o. female with a PMHx of DM, HTN and stroke 4 years ago  who presented to the hospital with near syncope, lightheadedness after she had 4 bouts of [facility] this [facility].   She felt cramping in her lower abdomen with urge to use the bathroom 15 minutes before [facility] ended.   Never had GI bleeding in the past, had Colonoscopy 4 years ago, was normal as fara s she remembers     Past Medical History:   Diagnosis Date   . Diabetes mellitus    . Hypertension        Past Surgical History:   Procedure Laterality Date   . BREAST SURGERY         Prior to Admission medications    Medication Sig Start Date End Date Taking? Authorizing Provider   carvedilol (COREG) 6.25 MG tablet Take 6.25 mg by mouth.   Yes [provider]   clopidogrel (PLAVIX) 75 mg tablet Take 75 mg by mouth daily.   Yes [provider]   losartan (COZAAR) 100 MG tablet Take 100 mg by mouth daily.   Yes [provider]   metFORMIN (GLUCOPHAGE) 500 MG tablet Take 500 mg by mouth daily.   Yes [provider]   simvastatin (ZOCOR) 40 MG tablet Take 40 mg by mouth nightly.   Yes [provider]       No Known Allergies    CODE STATUS: Full code     PRIMARY CARE MD: Pcp, Noneorunknown, MD    History reviewed. No pertinent family history.    Social History   Substance Use Topics   . Smoking status: Never Smoker   . Smokeless tobacco: Never Used   . Alcohol use No       REVIEW OF SYSTEMS   Positive for: lightheadedness, near syncope, rectal bleeding, abdominal cramps   Negative for: nausea, vomiting, fever, chills, headache, SOB, CP, palpitation   All  ROS completed and otherwise negative.    PHYSICAL EXAM     Vital Signs (most recent): BP 109/56   Pulse 100   Temp 98.1 F (36.7 C) (Oral)   Resp 16   Ht 1.575 m (5\' 2" )   Wt 78.5 kg (173 lb)   SpO2 100%   BMI 31.64 kg/m   Constitutional: No apparent distress. Patient speaks freely in full sentences.   HEENT: [place]/AT, PERRL, no scleral icterus or conjunctival pallor, no nasal discharge, MMM, oropharynx without erythema or exudate  Neck: trachea midline, supple, no cervical or supraclavicular lymphadenopathy or masses  Cardiovascular: RRR, normal S1 S2, no murmurs, gallops, palpable thrills, no JVD, Non-displaced PMI.  Respiratory: Normal rate. No retractions or increased work of breathing. Clear to auscultation and percussion bilaterally.  Gastrointestinal: +BS, non-distended, soft, non-tender, no rebound or guarding, no hepatosplenomegaly  Genitourinary: no suprapubic or costovertebral angle tenderness  Musculoskeletal: ROM and motor strength grossly normal. No clubbing, edema, or cyanosis. DP and radial pulses 2+ and symmetric.  Skin exam:  pink  Neurologic: EOMI, CN 2-12 grossly intact. no gross motor or sensory deficits  Psychiatric: AAOx3, affect and mood appropriate. The patient is alert, interactive, appropriate.  Capillary refill:  Normal    Exam done by Jolyn Lent, MD on 05/22/17 at 6:14 PM      LABS & IMAGING     Recent Results (from the past 24 hour(s))   Type and Screen    Collection Time: 05/22/17  5:08 PM   Result Value Ref Range    ABO Rh A NEG     AB Screen Gel NEG    CBC and differential    Collection Time: 05/22/17  5:08 PM   Result Value Ref Range    WBC 9.56 3.50 - 10.80 x10 3/uL    Hgb 10.5 (L) 12.0 - 16.0 g/dL    Hematocrit 16.1 (L) 37.0 - 47.0 %    Platelets 205 140 - 400 x10 3/uL    RBC 3.95 (L) 4.20 - 5.40 x10 6/uL    MCV 85.6 80.0 - 100.0 fL    MCH 26.6 (L) 28.0 - 32.0 pg    MCHC 31.1 (L) 32.0 - 36.0 g/dL    RDW 13 12 - 15 %    MPV 11.4 9.4 - 12.3 fL    Neutrophils 80.5 None  %    Lymphocytes Automated 11.2 None %    Monocytes 6.8 None %    Eosinophils Automated 1.0 None %    Basophils Automated 0.2 None %    Immature Granulocyte 0.3 None %    Nucleated RBC 0.0 0.0 - 1.0 /100 WBC    Neutrophils Absolute 7.69 1.80 - 8.10 x10 3/uL    Abs Lymph Automated 1.07 0.50 - 4.40 x10 3/uL    Abs Mono Automated 0.65 0.00 - 1.20 x10 3/uL    Abs Eos Automated 0.10 0.00 - 0.70 x10 3/uL    Absolute Baso Automated 0.02 0.00 - 0.20 x10 3/uL    Absolute Immature Granulocyte 0.03 0 x10 3/uL    Absolute NRBC 0.00 0 x10 3/uL   Comprehensive Metabolic Panel (CMP)    Collection Time: 05/22/17  5:08 PM   Result Value Ref Range    Glucose 433 (H) 70 - 100 mg/dL    BUN 09.6 (H) 7.0 - 04.5 mg/dL    Creatinine 1.4 (H) 0.6 - 1.0 mg/dL    Sodium 409 811 - 914 mEq/L    Potassium 5.1 3.5 - 5.1 mEq/L    Chloride 104 100 - 111 mEq/L    CO2 22 22 - 29 mEq/L    Calcium 9.2 7.9 - 10.2 mg/dL    Protein, Total 6.2 6.0 - 8.3 g/dL    Albumin 3.4 (L) 3.5 - 5.0 g/dL    AST (SGOT) 10 5 - 34 U/L    ALT 9 0 - 55 U/L    Alkaline Phosphatase 103 37 - 106 U/L    Bilirubin, Total 0.4 0.2 - 1.2 mg/dL    Globulin 2.8 2.0 - 3.6 g/dL    Albumin/Globulin Ratio 1.2 0.9 - 2.2    Anion Gap 11.0 5.0 - 15.0   PT/APTT    Collection Time: 05/22/17  5:08 PM   Result Value Ref Range    PT 14.0 12.6 - 15.0 sec    PT INR 1.1 0.9 - 1.1    PT Anticoag. Given Within 48 hrs. None     PTT 22 (L) 23 - 37 sec   Hemolysis index  Collection Time: 05/22/17  5:08 PM   Result Value Ref Range    Hemolysis Index 5 0 - 18   GFR    Collection Time: 05/22/17  5:08 PM   Result Value Ref Range    EGFR 44.0          IMAGING:  Upon my review:   No results found.    CARDIAC:  EKG Interpretation (upon my review):  Sinus tachycardia at rate of 101 BPM     Markers:        EMERGENCY DEPARTMENT COURSE:  Orders Placed This Encounter   Procedures   . CBC and differential   . Comprehensive Metabolic Panel (CMP)   . PT/APTT   . Hemolysis index   . GFR   . Vascular access to device  site   . ED Unit Sec Comm Order   . ECG 12 Lead   . Type and Screen   . Place (admit) for Observation Services   . [facility] ED Bed Request (Observation)       ASSESSMENT & PLAN     Carrie Schroeder is a 77 y.o. female admitted under [facility] observation with near syncope secondary to rectal bleeding,  Hematochezia.    Monitor vitals   Monitor H/H every 6 hours   Fall precaution   Hold plavix until cleared by GI to restart it   GI consulted by ER   NPO     AKI likely pre-renal due to GI bleeding   Continue IV hydration and monitor      HTN (hypertension) (05/22/2017)    Chronic well controlled   Continue home medicine      DM type 2 (diabetes mellitus, type 2) (05/22/2017)  Chronic uncontrolled   Hold oral medication   Start POCT FS with lispro SS     H/O small stroke 4 years ago   Hold plavix due to GI bleeding     Nutrition  NPO     DVT/VTE Prophylaxis  SCDs   Heparin contraindicated due to active GI bleeding     Anticipated medical stability for discharge: 2 nights     Service status/Reason for ongoing hospitalization: observation/ GI bleeding     Anticipated Discharge Needs: TBD     Terrace Arabia    05/22/2017 6:14 PM  Time Elapsed: 65 minutes

## 2017-05-22 NOTE — ED Notes (Signed)
Bed: BL17  Expected date: 05/22/17  Expected time:   Means of arrival: [facility]  Comments:  Medic 829-blood in stool

## 2017-05-22 NOTE — ED Notes (Signed)
[facility] ED BRID-BAND DOCUMENTATION   U# [id number]   BRID BAND # [id number]   SPECIMEN DRAWN BY (Full Name) Myrna Blazer, EMT

## 2017-05-22 NOTE — ED Triage Notes (Signed)
Carrie Schroeder is a 77 y.o. female, BIBA [facility] a [facility] for rectal bleeding. Patient felt abdominal cramps like moving BM and bright red noted. Dexi 403. Patient is AAox4. Denies chest pain. Breathing is even and unlabored.

## 2017-05-22 NOTE — ED Notes (Signed)
[facility] ED [facility] VERIFICATION   U# [id number]   BRID BAND # [id number]   VERIFIED BY (Full Name) Hillard Danker

## 2017-05-22 NOTE — ED Notes (Signed)
NURSING NOTE FOR THE RECEIVING INPATIENT NURSE   ED NURSE Talpa Alfonzo Feller   [facility] [address]   ADMISSION INFORMATION   Carrie Schroeder is a 77 y.o. female admitted with a diagnosis of:    1. Hematochezia          NURSING CARE   Mental Status Patient is AAox4   ADL ADLs: Self care  Ambulation:  Ambulatory with no assistive devise.   Pertinent Information  and Safety Concerns First episode of blood in the stool today. No abdominal pain. NSR on the monitor. Lungs clear. Skin intact.      VITAL SIGNS     Time of Last Set of Vitals 1830   Temperature 97.3   BP 160/70   Pulse 80   Respirations 16   Pulse OX 100        IV LINES     IV Catheter Size: 22    Peripheral IV 05/22/17 Left Antecubital (Active)   Site Assessment Clean;Dry;Intact 05/22/2017  5:11 PM   Line Status Saline Locked;Flushed 05/22/2017  5:11 PM   Dressing Status Clean;Dry;Intact 05/22/2017  5:11 PM   Dressing Dated? Yes 05/22/2017  5:11 PM   Number of days: 0          LAB RESULTS     Labs Reviewed   CBC AND DIFFERENTIAL - Abnormal; Notable for the following:        Result Value    Hgb 10.5 (*)     Hematocrit 33.8 (*)     RBC 3.95 (*)     MCH 26.6 (*)     MCHC 31.1 (*)     All other components within normal limits   COMPREHENSIVE METABOLIC PANEL - Abnormal; Notable for the following:     Glucose 433 (*)     BUN 24.0 (*)     Creatinine 1.4 (*)     Albumin 3.4 (*)     All other components within normal limits   PT AND APTT - Abnormal; Notable for the following:     PTT 22 (*)     All other components within normal limits   HEMOLYSIS INDEX   GFR   TYPE AND SCREEN

## 2017-05-22 NOTE — ED Provider Notes (Signed)
EMERGENCY DEPARTMENT HISTORY AND PHYSICAL EXAM     Physician/Midlevel provider first contact with patient: 05/22/17 1634         Date: 05/22/2017  Patient Name: Carrie Schroeder    History of Presenting Illness     Chief Complaint   Patient presents with   . Rectal Bleeding       History Provided By: Patient    Chief Complaint: Blood in stool  Duration: 2pm this afternoon   Timing:  Intermittent  Location: Rectum  Quality: Dark red, containing clots  Severity: Moderate  Exacerbating factors: None  Alleviating factors: None  Associated Symptoms: Abdominal cramping  Pertinent Negatives: No rectal pain    Additional History: Carrie Schroeder is a 77 y.o. female with a hx of TIA and DM presenting to the ED complaing of intermittent blood in stool, which began at 2pm this afternoon. Pt describes the blood as dark red in color and containing clots. She notes associated abdominal cramping before having a bowel movement and denies rectal pain. Of mention, pt is currently on Plavix and last had a colonoscopy three years ago (unable to recall results).      PCP: Pcp, Noneorunknown, MD  SPECIALISTS:    No current facility-administered medications for this encounter.      Current Outpatient Prescriptions   Medication Sig Dispense Refill   . carvedilol (COREG) 6.25 MG tablet Take 6.25 mg by mouth.     . clopidogrel (PLAVIX) 75 mg tablet Take 75 mg by mouth daily.     Marland Kitchen losartan (COZAAR) 100 MG tablet Take 100 mg by mouth daily.     . metFORMIN (GLUCOPHAGE) 500 MG tablet Take 500 mg by mouth daily.     . simvastatin (ZOCOR) 40 MG tablet Take 40 mg by mouth nightly.         Past History     Past Medical History:  Past Medical History:   Diagnosis Date   . Diabetes mellitus    . Hypertension        Past Surgical History:  Past Surgical History:   Procedure Laterality Date   . BREAST SURGERY         Family History:  History reviewed. No pertinent family history.    Social History:  Social History   Substance Use Topics   . Smoking  status: Never Smoker   . Smokeless tobacco: Never Used   . Alcohol use No       Allergies:  No Known Allergies    Review of Systems     Review of Systems   Constitutional: Negative for fever.   Gastrointestinal: Positive for abdominal pain and anal bleeding. Negative for rectal pain.   All other systems reviewed and are negative.        Physical Exam   BP 160/70   Pulse 80   Temp 97.3 F (36.3 C) (Oral)   Resp 16   Ht 5\' 2"  (1.575 m)   Wt 78.5 kg   SpO2 99%   BMI 31.64 kg/m     Physical Exam   Constitutional: Patient is alert.  Well nourished.  NAD  Head: Atraumatic.   Eyes: EOMI. PERRL  ENT:  MMM.   Neck:  FROM. No spinal tenderness. Neck supple.    Cardiovascular: Normal rate and regular rhythm.   Pulmonary/Chest: Effort normal and breath sounds normal. No respiratory distress.   Abdominal: Soft. There is NO tenderness. Bowel sounds present and normal.    Musculoskeletal:  No lower extremity edema or tenderness.    Neurological: Patient is alert and oriented to person, place, and time.  No focal deficits.   Skin: Skin is warm and dry.    Diagnostic Study Results     Labs -     Results     Procedure Component Value Units Date/Time    Type and Screen [[id number]] Collected:  05/22/17 1708    Specimen:  Blood Updated:  05/22/17 1803     ABO Rh A NEG     AB Screen Gel NEG    PT/APTT [[id number]]  (Abnormal) Collected:  05/22/17 1708     Updated:  05/22/17 1740     PT 14.0 sec      PT INR 1.1     PT Anticoag. Given Within 48 hrs. None     PTT 22 (L) sec     Comprehensive Metabolic Panel (CMP) [[id number]]  (Abnormal) Collected:  05/22/17 1708    Specimen:  Blood Updated:  05/22/17 1737     Glucose 433 (H) mg/dL      BUN 08.6 (H) mg/dL      Creatinine 1.4 (H) mg/dL      Sodium 578 mEq/L      Potassium 5.1 mEq/L      Chloride 104 mEq/L      CO2 22 mEq/L      Calcium 9.2 mg/dL      Protein, Total 6.2 g/dL      Albumin 3.4 (L) g/dL      AST (SGOT) 10 U/L      ALT 9 U/L      Alkaline Phosphatase 103 U/L       Bilirubin, Total 0.4 mg/dL      Globulin 2.8 g/dL      Albumin/Globulin Ratio 1.2     Anion Gap 11.0    Hemolysis index [[id number]] Collected:  05/22/17 1708     Updated:  05/22/17 1737     Hemolysis Index 5    GFR [[id number]] Collected:  05/22/17 1708     Updated:  05/22/17 1737     EGFR 44.0    CBC and differential [[id number]]  (Abnormal) Collected:  05/22/17 1708    Specimen:  Blood from Blood Updated:  05/22/17 1717     WBC 9.56 x10 3/uL      Hgb 10.5 (L) g/dL      Hematocrit 03.4 (L) %      Platelets 205 x10 3/uL      RBC 3.95 (L) x10 6/uL      MCV 85.6 fL      MCH 26.6 (L) pg      MCHC 31.1 (L) g/dL      RDW 13 %      MPV 11.4 fL      Neutrophils 80.5 %      Lymphocytes Automated 11.2 %      Monocytes 6.8 %      Eosinophils Automated 1.0 %      Basophils Automated 0.2 %      Immature Granulocyte 0.3 %      Nucleated RBC 0.0 /100 WBC      Neutrophils Absolute 7.69 x10 3/uL      Abs Lymph Automated 1.07 x10 3/uL      Abs Mono Automated 0.65 x10 3/uL      Abs Eos Automated 0.10 x10 3/uL      Absolute Baso Automated 0.02 x10 3/uL      Absolute Immature Granulocyte  0.03 x10 3/uL      Absolute NRBC 0.00 x10 3/uL           Radiologic Studies -   Radiology Results (24 Hour)     ** No results found for the last 24 hours. **      .    Medical Decision Making   I am the first provider for this patient.    I reviewed the vital signs, available nursing notes, past medical history, past surgical history, family history and social history.    Vital Signs-Reviewed the patient's vital signs.     Patient Vitals for the past 12 hrs:   BP Temp Pulse Resp   05/22/17 1825 160/70 97.3 F (36.3 C) 80 16   05/22/17 1609 109/56 98.1 F (36.7 C) 100 16       Pulse Oximetry Analysis - Normal 100% on RA    Cardiac Monitor:  Rate: 101  Rhythm:  Sinus Tachycardia     EKG:  Interpreted by the EP.   Time Interpreted: 16:38   Rate: 101   Rhythm: Sinus Tachycardia    Interpretation: No ST or T wave changes    Comparison: No prior study is  available for comparison.    Old Medical Records: Nursing notes.     ED Course:   5:50 PM - Pt resting comfortably, in no acute distress. Updated pt on blood work results. Discussed plan for hospitalization with pt, who agrees.    5:52 PM - Discussed pt case with Dr. Wynelle Bourgeois, [facility], who agrees to accept pt.    6:32 PM - Discussed pt case with Dr. Maple Hudson, GI, who will consult.    Provider Notes: Hematochezia w/clots.  Hgb 10.9.  Takes plavix, hasn't taken yet today.      For Hospitalized Patients:    1. Hospitalization Decision Time:  The decision to admit this patient was made by the emergency provider at 17:52 on 05/22/2017     2. Aspirin: Aspirin was not given because the patient is a bleeding risk      Diagnosis     Clinical Impression:   1. Hematochezia    2. Antiplatelet or antithrombotic long-term use        Treatment Plan:   ED Disposition     ED Disposition Condition Date/Time Comment    Observation  Thu May 22, 2017  5:55 PM Admitting Physician: Jolyn Lent [[id number]]   Diagnosis: Hematochezia [644034]   Estimated Length of Stay: < 2 midnights   Tentative Discharge Plan?: Home or Self Care [1]   Patient Class: Observation [104]              _______________________________      Attestations: This note is prepared by Ree Edman, acting as scribe for Ulice Bold, MD.    Ulice Bold, MD - The scribe's documentation has been prepared under my direction and personally reviewed by me in its entirety.  I confirm that the note above accurately reflects all work, treatment, procedures, and medical decision making performed by me.    _______________________________     Sharyne Richters, MD  05/23/17 [phone]

## 2017-05-23 DIAGNOSIS — D649 Anemia, unspecified: Secondary | ICD-10-CM | POA: Diagnosis not present

## 2017-05-23 DIAGNOSIS — D509 Iron deficiency anemia, unspecified: Secondary | ICD-10-CM | POA: Diagnosis not present

## 2017-05-23 DIAGNOSIS — K625 Hemorrhage of anus and rectum: Secondary | ICD-10-CM | POA: Diagnosis not present

## 2017-05-23 DIAGNOSIS — R55 Syncope and collapse: Secondary | ICD-10-CM | POA: Diagnosis not present

## 2017-05-23 DIAGNOSIS — I10 Essential (primary) hypertension: Secondary | ICD-10-CM | POA: Diagnosis not present

## 2017-05-23 DIAGNOSIS — K922 Gastrointestinal hemorrhage, unspecified: Secondary | ICD-10-CM | POA: Diagnosis not present

## 2017-05-23 LAB — BASIC METABOLIC PANEL
Anion Gap: 7 (ref 5.0–15.0)
BUN: 23 mg/dL — ABNORMAL HIGH (ref 7.0–19.0)
CO2: 22 mEq/L (ref 22–29)
Calcium: 8.4 mg/dL (ref 7.9–10.2)
Chloride: 109 mEq/L (ref 100–111)
Creatinine: 1 mg/dL (ref 0.6–1.0)
Glucose: 196 mg/dL — ABNORMAL HIGH (ref 70–100)
Potassium: 4.1 mEq/L (ref 3.5–5.1)
Sodium: 138 mEq/L (ref 136–145)

## 2017-05-23 LAB — GLUCOSE WHOLE BLOOD - POCT
Whole Blood Glucose POCT: 204 mg/dL — ABNORMAL HIGH (ref 70–100)
Whole Blood Glucose POCT: 182 mg/dL — ABNORMAL HIGH (ref 70–100)
Whole Blood Glucose POCT: 233 mg/dL — ABNORMAL HIGH (ref 70–100)
Whole Blood Glucose POCT: 210 mg/dL — ABNORMAL HIGH (ref 70–100)

## 2017-05-23 LAB — CBC AND DIFFERENTIAL
Absolute NRBC: 0 10*3/uL
Basophils Absolute Automated: 0.02 10*3/uL (ref 0.00–0.20)
Basophils Automated: 0.3 %
Eosinophils Absolute Automated: 0.18 10*3/uL (ref 0.00–0.70)
Eosinophils Automated: 2.7 %
Hematocrit: 27.7 % — ABNORMAL LOW (ref 37.0–47.0)
Hgb: 8.4 g/dL — ABNORMAL LOW (ref 12.0–16.0)
Immature Granulocytes Absolute: 0.03 10*3/uL
Immature Granulocytes: 0.5 %
Lymphocytes Absolute Automated: 1.6 10*3/uL (ref 0.50–4.40)
Lymphocytes Automated: 24.4 %
MCH: 26.6 pg — ABNORMAL LOW (ref 28.0–32.0)
MCHC: 30.3 g/dL — ABNORMAL LOW (ref 32.0–36.0)
MCV: 87.7 fL (ref 80.0–100.0)
MPV: 11.2 fL (ref 9.4–12.3)
Monocytes Absolute Automated: 0.78 10*3/uL (ref 0.00–1.20)
Monocytes: 11.9 %
Neutrophils Absolute: 3.95 10*3/uL (ref 1.80–8.10)
Neutrophils: 60.2 %
Nucleated RBC: 0 /100 WBC (ref 0.0–1.0)
Platelets: 151 10*3/uL (ref 140–400)
RBC: 3.16 10*6/uL — ABNORMAL LOW (ref 4.20–5.40)
RDW: 14 % (ref 12–15)
WBC: 6.56 10*3/uL (ref 3.50–10.80)

## 2017-05-23 LAB — HEMOGLOBIN AND HEMATOCRIT, BLOOD
Hematocrit: 27.1 % — ABNORMAL LOW (ref 37.0–47.0)
Hematocrit: 27.2 % — ABNORMAL LOW (ref 37.0–47.0)
Hgb: 8.5 g/dL — ABNORMAL LOW (ref 12.0–16.0)
Hgb: 8.5 g/dL — ABNORMAL LOW (ref 12.0–16.0)

## 2017-05-23 LAB — HEMOLYSIS INDEX: Hemolysis Index: 0 (ref 0–18)

## 2017-05-23 LAB — MAGNESIUM: Magnesium: 1.5 mg/dL — ABNORMAL LOW (ref 1.6–2.6)

## 2017-05-23 LAB — GFR: EGFR: >60

## 2017-05-23 MED ORDER — ATORVASTATIN CALCIUM 40 MG PO TABS
40.00 mg | ORAL_TABLET | Freq: Every evening | ORAL | Status: DC
Start: 2017-05-23 — End: 2017-05-24
  Administered 2017-05-23: 21:00:00 40 mg via ORAL
  Filled 2017-05-23: qty 1

## 2017-05-23 MED ORDER — SODIUM CHLORIDE 0.9 % IV SOLN
INTRAVENOUS | Status: DC
Start: 2017-05-23 — End: 2017-05-24

## 2017-05-23 NOTE — Plan of Care (Addendum)
Problem: Safety  Goal: Patient will be free from injury during hospitalization  Outcome: Progressing   05/23/17 0242   Goal/Interventions addressed this shift   Patient will be free from injury during hospitalization  Assess patient's risk for falls and implement fall prevention plan of care per policy;Provide and maintain safe environment;Use appropriate transfer methods;Ensure appropriate safety devices are available at the bedside;Include patient/ family/ care giver in decisions related to safety;Hourly rounding       Problem: Altered GI Function  Goal: Elimination patterns are normal or improving  Outcome: Progressing  Pt came from home having bloody stool,  Pt has stable vitals,   denied pain, no bloody stool during the shift,Today's POC, monitoring vitals, H/H, Acu-check, IV hydration,  am labs, safety, medication uses and effects explained, Pt verbalized understanding, will monitor Pt     05/23/17 0242   Goal/Interventions addressed this shift   Elimination patterns are normal or improving Report abnormal assessment to physician;Anticipate/assist with toileting needs;Assess for normal bowel sounds;Monitor for abdominal distension;Monitor for abdominal discomfort;Assess for signs and symptoms of bleeding. Report signs of bleeding to physician;Administer treatments as ordered;Consult/collaborate with Clinical Nutritionist

## 2017-05-23 NOTE — UM Notes (Signed)
MEDICARE HMO/AETNA MEDICARE ADVANTAGE    05/22/17 1755  Place (admit) for Observation Services (Adult Observation Admit Panel (AX)) Once         77 y.o. female presenting to the ED complaining of 4 bouts of bloody diarrhea  which began at 2pm this afternoon. Pt describes the blood as dark red in color and containing clots. She notes associated abdominal cramping before having a bowel movement and denies rectal pain. Of mention, pt is currently on Plavix and last had a colonoscopy three years ago (unable to recall results).    PMH:  DM, HTN and stroke 4 years ago       BP Temp Pulse Resp   05/22/17 1825 160/70 97.3 F (36.3 C) 80 16   05/22/17 1609 109/56 98.1 F (36.7 C) 100 16       Pulse Oximetry Analysis - Normal 100% on RA    Labs: H&H 10.5/33.8, Glucose 422, BUN 24, Creatinine 1.4, Albumin 3.4, PTT 22    Cardiac Monitor:  Rate: 101  Rhythm:  Sinus Tachycardia     EKG:  Interpreted by the EP.              Time Interpreted: 16:38              Rate: 101              Rhythm: Sinus Tachycardia               Interpretation: No ST or T wave changes               Comparison: No prior study is available for comparison.    ED Meds: NS 1L     Clinical Impression:   1. Hematochezia    2. Antiplatelet or antithrombotic long-term use        Internal Medicine  Monitor vitals   Monitor H/H every 6 hours   Fall precaution   Hold plavix until cleared by GI to restart it   GI consulted by ER   NPO     AKI likely pre-renal due to GI bleeding   Continue IV hydration and monitor      HTN (hypertension) (05/22/2017)    Chronic well controlled   Continue home medicine      DM type 2 (diabetes mellitus, type 2) (05/22/2017)  Chronic uncontrolled   Hold oral medication   Start POCT FS with lispro SS     H/O small stroke 4 years ago   Hold plavix due to GI bleeding     Nutrition  NPO     DVT/VTE Prophylaxis  SCDs   Heparin contraindicated due to active GI bleeding     Anticipated medical stability for discharge: 2 nights      Service status/Reason for ongoing hospitalization: observation/ GI bleeding     Anticipated Discharge Needs: TBD     Orders:  OBS   NF IVF @ 125  VS q4  IS q4  SCDs  NPO  H&H q6  Meds; Zocor  Prn meds; Tylenol, Sliding scale insulin, Zofran, Narcan    OBS CSR 05/23/17    BP 139/63   Pulse 76   Temp (!) 95.9 F (35.5 C) (Oral)   Resp 17   Ht 1.575 m (5\' 2" )   Wt 76.7 kg (169 lb 3.2 oz)   SpO2 100%   BMI 30.95 kg/m     Labs: H&H 8.5/27.2->8.4/27.2, Magnesium 1.5, Glucose 196, BUN 23, POCT Glucose 204  Scheduled Meds:  Current Facility-Administered Medications   Medication Dose Route Frequency   . carvedilol  6.25 mg Oral Q12H SCH   . losartan  100 mg Oral Daily   . simvastatin  40 mg Oral QHS     Continuous Infusions:  . sodium chloride 125 mL/hr at 05/23/17 0241     PRN Meds:.acetaminophen **OR** acetaminophen, Nursing communication: Adult Hypoglycemia Treatment Algorithm **AND** dextrose **AND** dextrose **AND** glucagon (rDNA), insulin lispro, insulin lispro, naloxone, ondansetron **OR** ondansetron     Dispo: home     Derrel Nip, RN, BSN  ACM  Utilization Review Case Manager   Case Management Department  [email or web address]  T [phone]  F [phone]    Please submit all clinical review requests via fax to [phone]

## 2017-05-23 NOTE — Consults (Signed)
CONSULTATION NOTE    [address], [place], [place] [address]  [phone]  Philis Kendall [id number]    Date of admission: 05/22/2017  Date of consult: 05/23/17      Assessment:  GIB probable LGIB d/t diverticulosis and aggravated by plavix.  Last colonoscopy 3-4 yrs ago ([place].)  Cannot r/o brisk upper.  Bleeding seems to have subsided.   Near Syncope   Anemia   Hx of CVA on plavix last dose 1/23  DM    Carrie Schroeder is a 77 y.o. female w/ hx of CVA on plavix p/w large volume rectal bleeding, relatively painless other than some pre-bleeding cramping w/ associated anemia secondary to acute blood loss.  Suspect diverticular bleeding which tends to be self limited.  H/H seems to have stabilized.   ___________________________  Plan:  - Would continue to observe pt.  No need for emergent endoscopic evaluation if remains stable/bleeding does not recur.  - full liquid diet  - recommend holding plavix x10d if ok w/ neuro regarding hx of CVA  - repeat CBC in the morning  - if remains stable (no bleeding/drops in H/H) then can be d/c'd Saturday w/ close outpt f/u GI/PCP  - please contact if any changes in her status.  Dr. Julaine Hua is taking over for this weekend.    Clydene Pugh, PA   1:14 PM    Thank you for allowing [place] to see and participate in this patient's care.  This case will be discussed with Dr. Ortencia Kick who will see this patient as well today and will write an accompanying GI consultation and treatment plan.  ________________________    Referring Physician: Dr. Merlene Laughter     Consulting Physician: Dr. Ortencia Kick    Reason for consultation: GIB    Chief complaint: bloody diarrhea     HPI:  Carrie Schroeder is a 77 y.o. female w/ PMHx DM, HTN, CVA 4 yrs ago on plavix, who presented to the ER yesterday c/o large volume rectal bleeding that started yesterday while she was playing BINGO w/ friends.  It occurred about 5 times, an ambulance was called, and then a 6th time while waiting for the ambulance to  come.  She had some abd cramping pre-rectal bleeding but otherwise no notable abd pain, nausea/vomiting, hematemesis, or GERD.  No rectal bleeding today.  Last dose of plavix was 2d ago (1/23.)  She had a colonoscopy approx 3-4 yrs ago in [place], reportedly normal.  No prior EGD.  Plans to leave for [facility] on Monday where she currently resides.     H/H 8.4/27.7 <-- 8.5/27.2 <-- 10.5/33.8.    Past Medical History:   Diagnosis Date   . Diabetes mellitus    . Hypertension        Past Surgical History:   Procedure Laterality Date   . BREAST SURGERY         No Known Allergies    Social History     Social History   . Marital status: Single     Spouse name: N/A   . Number of children: N/A   . Years of education: N/A     Occupational History   . Not on file.     Social History Main Topics   . Smoking status: Never Smoker   . Smokeless tobacco: Never Used   . Alcohol use No   . Drug use: No   . Sexual activity: Not on file     Other Topics  Concern   . Not on file     Social History Narrative   . No narrative on file       History reviewed. No pertinent family history.    Current Discharge Medication List      CONTINUE these medications which have NOT CHANGED    Details   carvedilol (COREG) 6.25 MG tablet Take 6.25 mg by mouth.      clopidogrel (PLAVIX) 75 mg tablet Take 75 mg by mouth daily.      losartan (COZAAR) 100 MG tablet Take 100 mg by mouth daily.      metFORMIN (GLUCOPHAGE) 500 MG tablet Take 500 mg by mouth daily.      simvastatin (ZOCOR) 40 MG tablet Take 40 mg by mouth nightly.             Current Facility-Administered Medications   Medication Dose Route Frequency Last Rate Last Dose   . 0.9%  NaCl infusion   Intravenous Continuous 100 mL/hr at 05/23/17 1117     . acetaminophen (TYLENOL) tablet 650 mg  650 mg Oral Q4H PRN        Or   . acetaminophen (TYLENOL) suppository 650 mg  650 mg Rectal Q4H PRN       . carvedilol (COREG) tablet 6.25 mg  6.25 mg Oral Q12H SCH   6.25 mg at 05/23/17 1114   . dextrose  (GLUCOSE) 40 % oral gel 15 g of glucose  15 g of glucose Oral PRN        And   . dextrose 50 % bolus 12.5 g  12.5 g Intravenous PRN        And   . glucagon (rDNA) (GLUCAGEN) injection 1 mg  1 mg Intramuscular PRN       . insulin lispro (HumaLOG) injection 1-3 Units  1-3 Units Subcutaneous QHS PRN   2 Units at 05/22/17 2158   . insulin lispro (HumaLOG) injection 1-5 Units  1-5 Units Subcutaneous TID AC PRN       . losartan (COZAAR) tablet 100 mg  100 mg Oral Daily   100 mg at 05/23/17 1114   . naloxone (NARCAN) injection 0.2 mg  0.2 mg Intravenous PRN       . ondansetron (ZOFRAN-ODT) disintegrating tablet 4 mg  4 mg Oral Q6H PRN        Or   . ondansetron (ZOFRAN) injection 4 mg  4 mg Intravenous Q6H PRN       . simvastatin (ZOCOR) tablet 40 mg  40 mg Oral QHS   40 mg at 05/22/17 2158       Review of Systems  Constitutional  Negative for fevers or weight loss   Skin  Negative for rash   HENT  Negative for sore throat   Eyes  Negative for blurred vision   Cardiovascular  Negative for chest pain   Respiratory  Negative for SOB or cough   Gastrointestinal  see HPI   Genitourinary  Negative for dysuria, hematuria, or frequency   Musculoskeletal  Negative for joint pain   Endo  Negative for diabetes   Heme  Negative for anemia   Neurological  CVA 4 yrs ago   Psych  Negative for depression       Physical Exam  BP 127/51   Pulse 71   Temp (!) 96.5 F (35.8 C) (Oral)   Resp 16   Ht 1.575 m (5\' 2" )   Wt 76.7 kg (169 lb 3.2 oz)  SpO2 98%   BMI 30.95 kg/m     General Appearance:    no acute distress, appears stated age and looks comfortable   HEENT:    Normocephalic, without obvious abnormality, atraumatic, sclera anicteric, oral mucosa pink/moist   Lungs:     Clear to auscultation bilaterally, no wheezing/rhonchi/rales    Heart:    Regular rate and rhythm, S1 and S2 normal, no murmur, rub or gallops appreciated   Abdomen:    soft, NT, ND, BS+   Rectal:   deferred    Extremities:   Extremities normal, atraumatic, no  cyanosis or edema   Skin:   Skin color, texture, turgor normal, no rashes or lesions and no jaundice   Neurologic:   AAOx3, no focal deficits           Psychological: normal affect    Laboratory Data reviewed:      Recent Labs  Lab 05/23/17  0451 05/23/17  0022 05/22/17  1708   WBC 6.56  --  9.56   Hgb 8.4* 8.5* 10.5*   Hematocrit 27.7* 27.2* 33.8*   Platelets 151  --  205   MCV 87.7  --  85.6   Neutrophils 60.2  --  80.5       Recent Labs  Lab 05/23/17  0451 05/22/17  1708   Sodium 138 137   Potassium 4.1 5.1   Chloride 109 104   CO2 22 22   BUN 23.0* 24.0*   Creatinine 1.0 1.4*   Glucose 196* 433*   Calcium 8.4 9.2   Magnesium 1.5*  --    Protein, Total  --  6.2   Albumin  --  3.4*   AST (SGOT)  --  10   ALT  --  9   Alkaline Phosphatase  --  103   Bilirubin, Total  --  0.4     Glucose:    Recent Labs  Lab 05/23/17  0451 05/22/17  1708   Glucose 196* 433*       Recent Labs  Lab 05/22/17  1708   PT 14.0   PT INR 1.1   PTT 22*     Microbiology Results     None        Radiological Imaging reviewed:    N/A

## 2017-05-23 NOTE — Progress Notes (Signed)
Met Carrie Schroeder at bedside.  Verified demo, PCP, and insurance. Carrie Schroeder currently lives in [place]. From 05/22/17, Carrie Schroeder is visiting a friend in [place]. MD with her 2 sons until 05/25/17.  PCP is Dr. Abbe Amsterdam, [address] [place], [place], [place] [address]. [phone]  No current DME.  No hx SNF, HH/Carrie Schroeder.  DCP to friend' house, then go back to [facility]. Transport via son.        05/23/17 0935   Patient Type   Within 30 Days of Previous Admission? No   Healthcare Decisions   Interviewed: Patient   Orientation/Decision Making Abilities of Patient Alert and Oriented x3, able to make decisions   Advance Directive Patient does not have advance directive   Healthcare Agent Appointed No   Prior to admission   Prior level of function Independent with ADLs;Ambulates independently   Type of Residence Private residence   Home Layout One level   Have running water, electricity, heat, etc? Yes   Living Arrangements Children   How do you get to your MD appointments? Ind   How do you get your groceries? Ind   Who fixes your meals? Ind   Who does your laundry? Ind   Who picks up your prescriptions? Ind   Dressing Independent   Grooming Independent   Feeding Independent   Bathing Independent   Toileting Independent   DME Currently at Home (None)   Home Care/Community Services (None)   Adult Protective Services (APS) involved? No   Discharge Planning   Support Systems Children;Friends/neighbors   Patient expects to be discharged to: Friend's house    Anticipated [place] plan discussed with: Same as interviewed   Mode of transportation: Private car (family member)   Consults/Providers   SLP Evaluation Needed 2   Correct PCP listed in Epic? No (comment)  (Dr. Abbe Amsterdam)     [place], RN  Case manager  [phone]

## 2017-05-23 NOTE — Progress Notes (Signed)
[facility]  PROGRESS NOTE      Patient: Carrie Schroeder  Date: 05/23/2017   LOS: 0 Days  Admission Date: 05/22/2017   MRN: [id number]  Attending: Derek Schroeder  Please contact me on the following Pager [phone]     ASSESSMENT/PLAN     Carrie Schroeder is a 77 y.o. female admitted with Hematochezia    Interval Summary:     Patient Active Hospital Problem List:   Hematochezia (05/22/2017)   HTN (hypertension) (05/22/2017)   DM type 2 (diabetes mellitus, type 2) (05/22/2017)   GI bleed   Near syncope    Anemia    Hx of CVA     Lower GI bleed  - hg/hct stable, continue to monitor  - transfuse in case hg<7   - GI consulted, appreciate recs. At this time no interventions  - continue to hold plavix   - per GI can be Carrie Schroeder'd in am if hg/hct remains stable     T2DM   - SSI and corrective coverage    Hx of CVA  - [place] zocor  - start lipitor 40mg  daily, given recent hx of stroke    Hypertension, controlled  - continue to monitor  - continue PTA cozaar and BB    Nutrition: Full liquid diet   DVT Prophylaxis:SCDs, hold chemical ppx given GI bleed     Code Status: Full  DISPO: TBD     SUBJECTIVE     Carrie Schroeder denies cp, sob. Denies further bleeding episodes. Denies dizziness/lightheadedness.     MEDICATIONS     Current Facility-Administered Medications   Medication Dose Route Frequency   . carvedilol  6.25 mg Oral Q12H SCH   . losartan  100 mg Oral Daily   . simvastatin  40 mg Oral QHS       PHYSICAL EXAM     Vitals:    05/23/17 1134   BP: 127/51   Pulse: 71   Resp: 16   Temp: (!) 96.5 F (35.8 C)   SpO2: 98%       Temperature: Temp  Min: 95.9 F (35.5 C)  Max: 98.6 F (37 C)  Pulse: Pulse  Min: 64  Max: 100  Respiratory: Resp  Min: 16  Max: 18  Non-Invasive BP: BP  Min: 91/51  Max: 160/70  Pulse Oximetry SpO2  Min: 97 %  Max: 100 %    Intake and Output Summary (Last 24 hours) at Date Time    Intake/Output Summary (Last 24 hours) at 05/23/17 1447  Last data filed at 05/23/17 1300   Gross per 24 hour   Intake          1213.75 ml    Output                0 ml   Net          1213.75 ml     GEN APPEARANCE: Normal;  A&OX3  HEENT: PERLA; EOMI; Conjunctiva Clear  NECK: Supple; No bruits  CVS: RRR, S1, S2; No M/G/R  LUNGS: CTAB; No Wheezes; No Rhonchi: No rales  ABD: Soft; No TTP; + Normoactive BS  EXT: No edema; Pulses 2+ and intact  Skin exam:  pink  NEURO: non focal, moves all extremities   CAP REFILL:  Normal  MENTAL STATUS:  Normal    Exam done by Carrie Mound, MD on 05/23/17 at 2:47 PM      LABS  Recent Labs  Lab 05/23/17  1359 05/23/17  0451 05/23/17  0022 05/22/17  1708   WBC  --  6.56  --  9.56   RBC  --  3.16*  --  3.95*   Hgb 8.5* 8.4* 8.5* 10.5*   Hematocrit 27.1* 27.7* 27.2* 33.8*   MCV  --  87.7  --  85.6   Platelets  --  151  --  205         Recent Labs  Lab 05/23/17  0451 05/22/17  1708   Sodium 138 137   Potassium 4.1 5.1   Chloride 109 104   CO2 22 22   BUN 23.0* 24.0*   Creatinine 1.0 1.4*   Glucose 196* 433*   Calcium 8.4 9.2   Magnesium 1.5*  --          Recent Labs  Lab 05/22/17  1708   ALT 9   AST (SGOT) 10   Bilirubin, Total 0.4   Albumin 3.4*   Alkaline Phosphatase 103               Recent Labs  Lab 05/22/17  1708   PT INR 1.1   PT 14.0   PTT 22*       Microbiology Results     None           RADIOLOGY     No new images     Carrie Schroeder Carrie Schroeder  2:47 PM 05/23/2017

## 2017-05-23 NOTE — Plan of Care (Signed)
Problem: Altered GI Function  Goal: Fluid and electrolyte balance are achieved/maintained  Outcome: Progressing  Comment: Patient is alert and oriented x4, able to make her needs known. No c/o chest pain nor SOB. No N/V nor diarrhea noted, No BM output reported. NPO status changed to Full liquid diet per GI PA. Patient tolerated well. PRN SSI was held while patient was NPO, covered per scale after patient was resumed her diet. Denied pain. IVF NS 100 ml/hr. Will continue with plan of care.    05/23/17 1855   Goal/Interventions addressed this shift   Fluid and electrolyte balance are achieved/maintained Monitor intake and output every shift;Monitor/assess lab values and report abnormal values;Provide adequate hydration;Assess and reassess fluid and electrolyte status;Observe for cardiac arrhythmias;Monitor for muscle weakness;Assess for confusion/personality changes;Monitor daily weight

## 2017-05-24 ENCOUNTER — Telehealth: Payer: Self-pay | Admitting: Family Medicine

## 2017-05-24 DIAGNOSIS — K922 Gastrointestinal hemorrhage, unspecified: Secondary | ICD-10-CM | POA: Diagnosis not present

## 2017-05-24 DIAGNOSIS — D649 Anemia, unspecified: Secondary | ICD-10-CM | POA: Diagnosis not present

## 2017-05-24 DIAGNOSIS — I10 Essential (primary) hypertension: Secondary | ICD-10-CM | POA: Diagnosis not present

## 2017-05-24 DIAGNOSIS — R55 Syncope and collapse: Secondary | ICD-10-CM | POA: Diagnosis not present

## 2017-05-24 LAB — GLUCOSE WHOLE BLOOD - POCT
Whole Blood Glucose POCT: 183 mg/dL — ABNORMAL HIGH (ref 70–100)
Whole Blood Glucose POCT: 168 mg/dL — ABNORMAL HIGH (ref 70–100)

## 2017-05-24 LAB — CBC AND DIFFERENTIAL
Absolute NRBC: 0 10*3/uL
Basophils Absolute Automated: 0.02 10*3/uL (ref 0.00–0.20)
Basophils Automated: 0.3 %
Eosinophils Absolute Automated: 0.17 10*3/uL (ref 0.00–0.70)
Eosinophils Automated: 3 %
Hematocrit: 26.1 % — ABNORMAL LOW (ref 37.0–47.0)
Hgb: 7.9 g/dL — ABNORMAL LOW (ref 12.0–16.0)
Immature Granulocytes Absolute: 0.02 10*3/uL
Immature Granulocytes: 0.3 %
Lymphocytes Absolute Automated: 1.39 10*3/uL (ref 0.50–4.40)
Lymphocytes Automated: 24.2 %
MCH: 26.6 pg — ABNORMAL LOW (ref 28.0–32.0)
MCHC: 30.3 g/dL — ABNORMAL LOW (ref 32.0–36.0)
MCV: 87.9 fL (ref 80.0–100.0)
MPV: 11.4 fL (ref 9.4–12.3)
Monocytes Absolute Automated: 0.51 10*3/uL (ref 0.00–1.20)
Monocytes: 8.9 %
Neutrophils Absolute: 3.64 10*3/uL (ref 1.80–8.10)
Neutrophils: 63.3 %
Nucleated RBC: 0 /100 WBC (ref 0.0–1.0)
Platelets: 129 10*3/uL — ABNORMAL LOW (ref 140–400)
RBC: 2.97 10*6/uL — ABNORMAL LOW (ref 4.20–5.40)
RDW: 14 % (ref 12–15)
WBC: 5.75 10*3/uL (ref 3.50–10.80)

## 2017-05-24 LAB — CBC
Absolute NRBC: 0 10*3/uL
Hematocrit: 26.9 % — ABNORMAL LOW (ref 37.0–47.0)
Hgb: 8.4 g/dL — ABNORMAL LOW (ref 12.0–16.0)
MCH: 27.1 pg — ABNORMAL LOW (ref 28.0–32.0)
MCHC: 31.2 g/dL — ABNORMAL LOW (ref 32.0–36.0)
MCV: 86.8 fL (ref 80.0–100.0)
MPV: 11.1 fL (ref 9.4–12.3)
Nucleated RBC: 0 /100 WBC (ref 0.0–1.0)
Platelets: 143 10*3/uL (ref 140–400)
RBC: 3.1 10*6/uL — ABNORMAL LOW (ref 4.20–5.40)
RDW: 14 % (ref 12–15)
WBC: 5.62 10*3/uL (ref 3.50–10.80)

## 2017-05-24 LAB — BASIC METABOLIC PANEL
Anion Gap: 5 (ref 5.0–15.0)
BUN: 13 mg/dL (ref 7.0–19.0)
CO2: 22 mEq/L (ref 22–29)
Calcium: 8.3 mg/dL (ref 7.9–10.2)
Chloride: 111 mEq/L (ref 100–111)
Creatinine: 0.9 mg/dL (ref 0.6–1.0)
Glucose: 167 mg/dL — ABNORMAL HIGH (ref 70–100)
Potassium: 4.2 mEq/L (ref 3.5–5.1)
Sodium: 138 mEq/L (ref 136–145)

## 2017-05-24 LAB — GFR: EGFR: >60

## 2017-05-24 LAB — HEMOLYSIS INDEX: Hemolysis Index: 4 (ref 0–18)

## 2017-05-24 MED ORDER — MAGNESIUM SULFATE IN D5W 1-5 GM/100ML-% IV SOLN
1.00 g | Freq: Once | INTRAVENOUS | Status: AC
Start: 2017-05-24 — End: 2017-05-24
  Administered 2017-05-24: 16:00:00 1 g via INTRAVENOUS
  Filled 2017-05-24: qty 100

## 2017-05-24 MED ORDER — ATORVASTATIN CALCIUM 40 MG PO TABS
40.00 mg | ORAL_TABLET | Freq: Every evening | ORAL | 1 refills | Status: AC
Start: 2017-05-24 — End: ?

## 2017-05-24 NOTE — Telephone Encounter (Signed)
On call note.  Call from Dr. Volanda Napoleon in [place], her cell is [phone]. Patient was seen at hospital in [place].  Lower GIB, hgb down to 8.5 but bleeding stopped and hgb stabilized. She was discharged and is coming back to [place].    plavix was stopped in the meantime.   Routed to PCP as FYI and for input.  Dr. Volanda Napoleon- please contact Dr. Volanda Napoleon if needed.  Please have staff set up follow up at your clinic on Monday.  I'll defer to PCP about setting up GI eval.

## 2017-05-24 NOTE — Plan of Care (Addendum)
Problem: Safety  Goal: Patient will be free from injury during hospitalization  Outcome: Progressing   05/24/17 0124   Goal/Interventions addressed this shift   Patient will be free from injury during hospitalization  Assess patient's risk for falls and implement fall prevention plan of care per policy;Provide and maintain safe environment;Use appropriate transfer methods;Ensure appropriate safety devices are available at the bedside;Include patient/ family/ care giver in decisions related to safety;Hourly rounding     Problem: Altered GI Function  Goal: Elimination patterns are normal or improving  Outcome: Progressing  Pt denied pain, no bleeding noticed, Pt diet advanced to full liquid diet, tolerated   it well, on IV NACL @ 100 cc/hr, no ABD discomfort noticed, voided well, possible D/C, will monitor Pt   05/24/17 0124   Goal/Interventions addressed this shift   Elimination patterns are normal or improving Report abnormal assessment to physician;Anticipate/assist with toileting needs;Assess for normal bowel sounds;Monitor for abdominal distension;Monitor for abdominal discomfort;Assess for signs and symptoms of bleeding. Report signs of bleeding to physician;Administer treatments as ordered;Consult/collaborate with Clinical Nutritionist;Assess for flatus;Collaborate with LIP for containment device

## 2017-05-24 NOTE — Discharge Instr - AVS First Page (Addendum)
Reason for your Hospital Admission:  Rectal bleeding - Likely Acute Diverticular Bleed. Discharge HgB 8.4        Instructions for after your discharge:  Follow up with your PCP   Please do not take your plavix until 2/5 or per directions from your Primary medical doctor or your gastroenterologist.

## 2017-05-24 NOTE — Discharge Instructions (Signed)
Understanding Rectal Bleeding    Rectal bleeding is when blood passes through your rectum and anus. It can happen with or without a bowel movement. Rectal bleeding may be a sign of a serious problem inyour rectum, colon, or upper GI tract. Call your healthcare provider right away if you have any rectal bleeding.  The GI Tract  The gastrointestinal (GI) tract includes the mouth, esophagus, stomach, small intestine, large intestine (colon), rectum, and anus. The food you eat is digested as it passes through the GI tract. Solid waste leaves the body through the rectum.   Rectal bleeding and GI problems  The cause of rectal bleeding may be found in any region of the GI tract. The colon or rectum may be the site of your bleeding problem. Or, bleeding may be due to problems farther up the GI tract, such as in the small intestine, duodenum, or stomach.  Causes of rectal bleeding  Rectal bleeding causes include the following:   Hemorrhoids (swollen veins in the rectum and anus)   Fissures (tears in or near the anus)   Diverticulosis (inflamed pockets in the colon wall)   Infection   Ischemia (low blood flow)   Radiation damage   Inflammatory bowel disease (Crohn's disease or ulcerative colitis)   Ulcers in the upper GI tract and inflammation of the large intestine   Abnormal tissue growths (tumors or polyps) in the GI tract   A bulging rectum (also called a rectal prolapse)   Abnormal blood vessels in the small intestine or in the colon  Common symptoms  Common symptoms include the following:   Rectal pain, itching, or soreness   Belly pain orepigastric pain   Minor occasional drops of blood that appear on the stool or toilet paper, to greater amounts of stool that appear black or tarry  Rectal bleeding can also happen without pain.  Date Last Reviewed: 10/28/2014   2000-2018 [employer] [address], [place] [address]. All rights reserved. This information is not intended as a  substitute for professional medical care. Always follow your healthcare professional's instructions.

## 2017-05-24 NOTE — Plan of Care (Signed)
Pt discharge to home. IV removed.   One dose of IV Magnesium given before discharge.   Pt denied any more rectal bleeding after AM.   H/H stable. Encouraged pt to take over the counter Iron supplement.   Pt verbalized understanding.

## 2017-05-24 NOTE — Discharge Summary (Signed)
[facility]      Patient: Carrie Schroeder  Admission Date: 05/22/2017   DOB: 07-08-1940  Discharge Date:    MRN: [id number]  Discharge Attending:Dwayne Begay Jo[place]     Referring Physician: Christa See, MD  PCP: Christa See, MD       DISCHARGE SUMMARY     Discharge Information   Admission Diagnosis:   Hematochezia    Discharge Diagnosis:   Active Hospital Problems    Diagnosis   . HTN (hypertension)   . DM type 2 (diabetes mellitus, type 2)        Admission Condition: Guarded  Discharge Condition: Stable  Consultants: GI: Dr. Bland Span  Functional Status: full  Discharged to: home  Procedures: none  Surgeries: none      Imaging:     No results found.   Echo Results     None        Discharge Medications:     Medication List      START taking these medications    atorvastatin 40 MG tablet  Commonly known as:  LIPITOR  Take 1 tablet (40 mg total) by mouth nightly.        CONTINUE taking these medications    carvedilol 6.25 MG tablet  Commonly known as:  COREG     clopidogrel 75 mg tablet  Commonly known as:  PLAVIX     losartan 100 MG tablet  Commonly known as:  COZAAR     metFORMIN 500 MG tablet  Commonly known as:  GLUCOPHAGE        STOP taking these medications    simvastatin 40 MG tablet  Commonly known as:  ZOCOR           Where to Get Your Medications      You can get these medications from any pharmacy    Bring a paper prescription for each of these medications   atorvastatin 40 MG tablet             Hospital Course   Presentation History / Hospital Course (0 Days):  Tarica Harl is a 77 y.o. female w/ hx of CVA on plavix p/w large volume rectal bleeding, relatively painless other than some pre-bleeding cramping w/ associated anemia secondary to acute blood loss.      Patient Active Hospital Problem List:   Hematochezia (05/22/2017)   HTN (hypertension) (05/22/2017)   DM type 2 (diabetes mellitus, type 2) (05/22/2017)   GI bleed   Near syncope    Anemia    Hx of CVA    Suspect diverticular  bleeding which tends to be self limited.    -H/H have stabilized. She was followed closely for signs of rebleeding and she did rebleed, plans were for colonosocpy vs CTA   -evaluated by GI: In absence of ongoing bleeding defer at present on colonosocpy  -On Plavix for CVA approximately 4 years ago - have been holding in house for at least 10 days  - Pt Resides in [place], hoping to return on Monday - Her PMD's office was contacted 1/26      T2DM   - metformin, with SSI and corrective coverage    Hx of CVA  - dcd zocor  - start lipitor 40mg  daily, given recent hx of stroke    Hypertension, controlled  - continue to monitor  - continue PTA cozaar and BB    Nutrition: Full liquid diet  Advanced to full consistent carbohydrate diet, Magnesium was  repleated    DVT Prophylaxis:SCDs, held chemical ppx given GI bleed  RECOMMENDATIONS:    - Patient was informed of abnormal and incidental imaging findings during hospitalization, and advised to review this information with their  medical provider.             Best Practices   Was the patient admitted with either a CHF Exacerbation or Pneumonia? NO     Progress Note/Physical Exam at Discharge     Subjective: Patient reported feeling well, and is ready for discharge.    Vitals:    05/24/17 0934 05/24/17 0935 05/24/17 1215 05/24/17 1541   BP: 142/60  133/61 117/59   Pulse:  80 72 80   Resp:   16 16   Temp:   (!) 96.9 F (36.1 C) (!) 96.7 F (35.9 C)   TempSrc:   Oral Oral   SpO2:   100% 99%   Weight:       Height:           General: NAD  HEENT: sclera anicteric, OP: Clear, MMM  Cardiovascular: RRR, no m/r/g  Lungs: CTAB, no w/r/r  Abdomen: soft, +BS, NT/ND, no masses, no g/r  Extremities: Warm and well perfused  Skin: no rashes or lesions noted on exposed surfaces  Neuro: Answers questions appropriately, responds to commands       Diagnostics     Labs/Studies Pending at Discharge:    Last Labs     Recent Labs  Lab 05/24/17  1002 05/24/17  0407 05/23/17  1359  05/23/17  0451   WBC 5.62 5.75  --  6.56   RBC 3.10* 2.97*  --  3.16*   Hgb 8.4* 7.9* 8.5* 8.4*   Hematocrit 26.9* 26.1* 27.1* 27.7*   MCV 86.8 87.9  --  87.7   Platelets 143 129*  --  151         Recent Labs  Lab 05/24/17  0407 05/23/17  0451 05/22/17  1708   Sodium 138 138 137   Potassium 4.2 4.1 5.1   Chloride 111 109 104   CO2 22 22 22    BUN 13.0 23.0* 24.0*   Creatinine 0.9 1.0 1.4*   Glucose 167* 196* 433*   Calcium 8.3 8.4 9.2   Magnesium  --  1.5*  --        Microbiology Results     None           Patient Instructions   Discharge Diet: Heart healthy  Discharge Activity: As tolerated  LABS/TESTING recommended after discharge    Follow Up Appointment: Please work with your primary, Dr. Abbe Amsterdam [phone] to coordinate scheduling a GI follow up appointment.     Please hold your plavix until 2/5 unless told otherwise by your doctors in [place].     Follow-up Information     Pcp, Noneorunknown, MD .                  Time spent examining patient, discussing with patient/family regarding hospital course, chart review, reconciling medications and discharge planning: > 35 minutes.  This patient was examined by me on , the day of discharge.    Signed,  Karlton Lemon    3:48 PM 05/24/2017

## 2017-05-24 NOTE — Progress Notes (Addendum)
Pt is visiting the area from [place].  Will [place] today with no need identified.       05/24/17 1713   Discharge Disposition   Patient preference/choice provided? N/A   Physical Discharge Disposition Home   Serrena Linderman T Tobie Perdue,MSW  [phone]

## 2017-05-24 NOTE — Hospital Course (Signed)
Relevant Summary for Hospital Course:   You were admitted to [facility] in the setting of acute painless rectal bleeding, evaluated by gastroenterology,if Plavix was held with goal duration of 10 days, your hemoglobin was checked frequently, and found to be stable at time of discharge with a hemoglobin of 8.4. Your cholesterol medication was changed to an increased intensity medication given your stroke history. See below for any symptoms that should warrant further emergency evaluation, but otherwise please go to gastroenterologist within the next week upon arriving at your home in [place]

## 2017-05-25 LAB — ECG 12-LEAD
Atrial Rate: 101 {beats}/min
P Axis: 20 degrees
P-R Interval: 130 ms
Q-T Interval: 354 ms
QRS Duration: 82 ms
QTC Calculation (Bezet): 459 ms
R Axis: 24 degrees
T Axis: 16 degrees
Ventricular Rate: 101 {beats}/min

## 2017-05-26 NOTE — Telephone Encounter (Signed)
Patient has been scheduled to come in for an appointment for 05/27/2017 at 1pm

## 2017-05-27 ENCOUNTER — Telehealth: Payer: Self-pay | Admitting: Gastroenterology

## 2017-05-27 ENCOUNTER — Encounter: Payer: Self-pay | Admitting: Family Medicine

## 2017-05-27 ENCOUNTER — Ambulatory Visit (INDEPENDENT_AMBULATORY_CARE_PROVIDER_SITE_OTHER): Payer: Medicare HMO | Admitting: Family Medicine

## 2017-05-27 VITALS — BP 120/60 | HR 75 | Temp 97.6°F | Wt 176.0 lb

## 2017-05-27 DIAGNOSIS — K922 Gastrointestinal hemorrhage, unspecified: Secondary | ICD-10-CM

## 2017-05-27 DIAGNOSIS — Z8673 Personal history of transient ischemic attack (TIA), and cerebral infarction without residual deficits: Secondary | ICD-10-CM | POA: Insufficient documentation

## 2017-05-27 DIAGNOSIS — M1990 Unspecified osteoarthritis, unspecified site: Secondary | ICD-10-CM | POA: Insufficient documentation

## 2017-05-27 LAB — CBC WITH DIFFERENTIAL/PLATELET
BASOS ABS: 0 10*3/uL (ref 0.0–0.1)
Basophils Relative: 0.6 % (ref 0.0–3.0)
EOS ABS: 0.2 10*3/uL (ref 0.0–0.7)
Eosinophils Relative: 2.9 % (ref 0.0–5.0)
HCT: 27.9 % — ABNORMAL LOW (ref 36.0–46.0)
Hemoglobin: 9.1 g/dL — ABNORMAL LOW (ref 12.0–15.0)
Lymphocytes Relative: 28.3 % (ref 12.0–46.0)
Lymphs Abs: 1.8 10*3/uL (ref 0.7–4.0)
MCHC: 32.6 g/dL (ref 30.0–36.0)
MCV: 83.9 fl (ref 78.0–100.0)
Monocytes Absolute: 0.6 10*3/uL (ref 0.1–1.0)
Monocytes Relative: 10.4 % (ref 3.0–12.0)
NEUTROS ABS: 3.6 10*3/uL (ref 1.4–7.7)
Neutrophils Relative %: 57.8 % (ref 43.0–77.0)
PLATELETS: 206 10*3/uL (ref 150.0–400.0)
RBC: 3.33 Mil/uL — ABNORMAL LOW (ref 3.87–5.11)
RDW: 14.6 % (ref 11.5–15.5)
WBC: 6.3 10*3/uL (ref 4.0–10.5)

## 2017-05-27 NOTE — Progress Notes (Signed)
Subjective:    Patient ID: Jasmine Carlson, female    DOB: December 09, 1940, 77 y.o.   MRN: [id number]  Chief Complaint  Patient presents with  . 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  Over the weekend, whille [place] was [place] she experienced abdominal cramping.  When she went to the restroom she noticed BRBPR.  Pt states the blood filled the toilet.  Pt was taken to [place], [place] via ambulance.  Initial H&H was 8.4/26.9.  WBC  is 5.62, platelets 143.  During hospitalization, x 2 days, pt was given gentle IVFs.  As p's bleeding stopped/pt was stable no invasive procedures were done.  Of note, no hemorrhoids noted.  Patient was seen by Dr. Volanda Napoleon, who contacted the on-call service over the weekend to advise about pt's hospitalization.  Dr. Loistine Chance contact info can be found in the phone note from 05/24/17.   Pt was advised to follow-up with her PCP.  Of note patient's Plavix was stopped.  Patient was started on Plavix years ago for TIA.  Since being home pt states she feels ok. She has not had any more loose stools or noted any blood in the toilet.  Pt states she had a formed BM yesterday without any blood.  Pt does feel dizzy at times.  She also noted blurred vision, but attributes it to the rx in her glasses needing to be changed.  Past Medical History:  Diagnosis Date  . Cancer ([place])    breast  . Diabetes mellitus without complication ([place])   . Hyperlipidemia   . Hypertension     No Known Allergies  ROS General: Denies fever, chills, night sweats, changes in weight, changes in appetite  +dizziness HEENT: Denies headaches, ear pain, rhinorrhea, sore throat   +changes in vision CV: Denies CP, palpitations, SOB, orthopnea Pulm: Denies SOB, cough, wheezing GI: Denies nausea, vomiting, diarrhea, constipation  + loose stools,  BRBPR, abd cramping GU: Denies dysuria, hematuria, frequency, vaginal discharge Msk: Denies muscle cramps, joint pains Neuro: Denies weakness, numbness, tingling Skin: Denies rashes, bruising Psych: Denies depression, anxiety, hallucinations     Objective:    Blood pressure 120/60, pulse 75, temperature 97.6 F (36.4 C), temperature source Oral, weight 176 lb (79.8 kg). Vision checked. R20/25  L 20/40  B 20/25  Gen. Pleasant, well-nourished, in no distress, normal affect   HEENT: [place]/AT, face symmetric,  no scleral icterus, PERRLA, nares patent without drainage, pharynx without erythema or exudate. Lungs: no accessory muscle use, CTAB, no wheezes or rales Cardiovascular: RRR, no m/r/g, no peripheral edema Abdomen: BS present, soft, NT/ND Neuro:  A&Ox3, CN II-XII intact, normal gait Skin:  Warm, no lesions/ rash   Wt Readings from Last 3 Encounters:  05/27/17 176 lb (79.8 kg)  05/12/17 174 lb 8 oz (79.2 kg)  05/06/17 173 lb 8 oz (78.7 kg)    Lab Results  Component Value Date   WBC 6.5 12/06/2016   HGB 11.3 (L) 12/06/2016   HCT 35.1 (L) 12/06/2016   PLT 192 12/06/2016   GLUCOSE 118 01/02/2017   ALT 11 01/02/2017   AST 15 01/02/2017   NA 138 01/02/2017   K 4.3 01/02/2017   CL 102 12/06/2016   CREATININE 1.1 01/02/2017   BUN 14.2 01/02/2017   CO2 24 01/02/2017  Assessment/Plan:  Gastrointestinal hemorrhage, unspecified gastrointestinal hemorrhage type  -Labs from OSH reviewed.  We will obtain repeat stat CBC given feeling of dizziness. -Placed urgent referral for GI.  Will likely benefit from colonoscopy. -We will continue to hold Plavix at this time -Patient given RTC or ED precautions including increased dizziness, rebleeding, fatigue, etc. - Plan: Ambulatory referral to Gastroenterology, CBC with Differential/Platelet -Follow-up in 1 week if needed  Pt encouraged to f/u on vision with her optometrist.  Grier Mitts, MD

## 2017-05-27 NOTE — Patient Instructions (Addendum)
Gastrointestinal Bleeding °Gastrointestinal (GI) bleeding is bleeding somewhere along the digestive tract, between the mouth and anus. This can be caused by various problems. The severity of these problems can range from mild to serious or even life-threatening. If you have GI bleeding, you may find blood in your stools (feces), you may have black stools, or you may vomit blood. If there is a lot of bleeding, you may need to stay in the hospital. °What are the causes? °This condition may be caused by: °· Esophagitis. This is inflammation, irritation, or swelling of the esophagus. °· Hemorrhoids. These are swollen veins in the rectum. °· Anal fissures. These are areas of painful tearing that are often caused by passing hard stool. °· Diverticulosis. These are pouches that form on the colon over time, with age, and may bleed a lot. °· Diverticulitis. This is inflammation in areas with diverticulosis. It can cause pain, fever, and bloody stools, although bleeding may be mild. °· Polyps and cancer. Colon cancer often starts out as precancerous polyps. °· Gastritis and ulcers. With these, bleeding may come from the upper GI tract, near the stomach. ° °What are the signs or symptoms? °Symptoms of this condition may include: °· Bright red blood in your vomit, or vomit that looks like coffee grounds. °· Bloody, black, or tarry stools. °? Bleeding from the lower GI tract will usually cause red or maroon blood in the stools. °? Bleeding from the upper GI tract may cause black, tarry, often bad-smelling stools. °? In certain cases, if the bleeding is fast enough, the stools may be red. °· Pain or cramping in the abdomen. ° °How is this diagnosed? °This condition may be diagnosed based on: °· Medical history and physical exam. °· Various tests, such as: °? Blood tests. °? X-rays and other imaging tests. °? Esophagogastroduodenoscopy (EGD). In this test, a flexible, lighted tube is used to look at your esophagus, stomach, and  small intestine. °? Colonoscopy. In this test, a flexible, lighted tube is used to look at your colon. ° °How is this treated? °Treatment for this condition depends on the cause of the bleeding. For example: °· For bleeding from the esophagus, stomach, small intestine, or colon, the health care provider doing your EGD or colonoscopy may be able to stop the bleeding as part of the procedure. °· Inflammation or infection of the colon can be treated with medicines. °· Certain rectal problems can be treated with creams, suppositories, or warm baths. °· Surgery is sometimes needed. °· Blood transfusions are sometimes needed if a lot of blood has been lost. ° °If bleeding is slow, you may be allowed to go home. If there is a lot of bleeding, you will need to stay in the hospital for observation. °Follow these instructions at home: °· Take over-the-counter and prescription medicines only as told by your health care provider. °· Eat foods that are high in fiber. This will help to keep your stools soft. These foods include whole grains, legumes, fruits, and vegetables. Eating 1-3 prunes each day works well for many people. °· Drink enough fluid to keep your urine clear or pale yellow. °· Keep all follow-up visits as told by your health care provider. This is important. °Contact a health care provider if: °· Your symptoms do not improve. °Get help right away if: °· Your bleeding increases. °· You feel light-headed or you faint. °· You feel weak. °· You have severe cramps in your back or abdomen. °· You pass large blood clots in your stool. °·   Your symptoms are getting worse. °This information is not intended to replace advice given to you by your health care provider. Make sure you discuss any questions you have with your health care provider. °Document Released: 04/12/2000 Document Revised: 09/13/2015 Document Reviewed: 10/03/2014 °Elsevier Interactive Patient Education © 2018 [employer] ° °

## 2017-05-27 NOTE — Telephone Encounter (Signed)
I don't have nay notes. The CBC has not been collected when I last checked. Tele[place]. Hgb stabilized at 8.5 per note. Her Plavix was held per telephone note.

## 2017-05-28 ENCOUNTER — Encounter: Payer: Self-pay | Admitting: Physician Assistant

## 2017-05-28 NOTE — Telephone Encounter (Signed)
Please schedule next available appointment with any provider that is earliest within 1-2 weeks. Thanks

## 2017-05-28 NOTE — Telephone Encounter (Signed)
Appointment scheduled.

## 2017-06-06 ENCOUNTER — Ambulatory Visit: Payer: Medicare HMO | Admitting: Physician Assistant

## 2017-06-09 DIAGNOSIS — M25562 Pain in left knee: Secondary | ICD-10-CM | POA: Diagnosis not present

## 2017-06-09 DIAGNOSIS — M170 Bilateral primary osteoarthritis of knee: Secondary | ICD-10-CM | POA: Diagnosis not present

## 2017-06-09 DIAGNOSIS — M25561 Pain in right knee: Secondary | ICD-10-CM | POA: Diagnosis not present

## 2017-06-10 ENCOUNTER — Other Ambulatory Visit: Payer: Self-pay | Admitting: Emergency Medicine

## 2017-06-10 ENCOUNTER — Ambulatory Visit: Payer: Self-pay | Admitting: *Deleted

## 2017-06-10 DIAGNOSIS — R69 Illness, unspecified: Secondary | ICD-10-CM | POA: Diagnosis not present

## 2017-06-10 MED ORDER — ONETOUCH DELICA LANCETS FINE MISC: 11 refills | Status: DC

## 2017-06-10 MED ORDER — GLUCOSE BLOOD VI STRP: ORAL_STRIP | 12 refills | Status: DC

## 2017-06-10 NOTE — Telephone Encounter (Signed)
Spoke with patient regarding her sugar being elevated and gave Dr. Volanda Napoleon recommendation on why sugar could have been elevated. Patient understood and had no further questions.  Also patient strips and lancets have been sent to the pharmacy. Nothing further needed

## 2017-06-10 NOTE — Telephone Encounter (Signed)
Please send in refill for test strips and lancets.  Pt's fsbs likely elevated 2/2 cortisone injections.

## 2017-06-10 NOTE — Telephone Encounter (Signed)
Patient is calling to report that she got a reading of 314 this morning for her fasting glucose. Patient states she was at the [facility] yesterday and was given 2 cortisone injections in her knee. She was told this could increase her glucose levels.  Patient states she has had elevated readings since her hospitalization for GI bleeding.She has her appointment with GI this week.(She reports no blood in stool presently) She reports she has been taking 2 Metformin a day- am/pm - but she took 2 this morning when she glucose reading was so high. Told patient she needs to increase her fluid intake and eat high protein meals. I will send message to her provider- she will need instruction on her medication management- because she is only supposed to take 1 pill daily.   She is also requesting test strips and lancets. One Touch Meter Verio strips and Delica lancets. She uses [facility]/[address]   Patient can be reached at her cell/home number.  Reason for Disposition . [1] Blood glucose > 240  mg/dl (13 mmol/l) AND [2] does not  use insulin (e.g., not insulin-dependent; most people with type 2 diabetes)  Answer Assessment - Initial Assessment Questions 1. BLOOD GLUCOSE: "What is your blood glucose level?"      314 this fasting 2. ONSET: "When did you check the blood glucose?"     7:30 this morning 3. USUAL RANGE: "What is your glucose level usually?" (e.g., usual fasting morning value, usual evening value)     (120-140- depending on when taken and diet- patient has not been checking recently) 4. KETONES: "Do you check for ketones (urine or blood test strips)?" If yes, ask: "What does the test show now?"      n/a 5. TYPE 1 or 2:  "Do you know what type of diabetes you have?"  (e.g., Type 1, Type 2, Gestational; doesn't know)      Type 2 6. INSULIN: "Do you take insulin?" If yes, ask: "Have you missed any shots recently?"     no 7. DIABETES PILLS: "Do you take any pills for your diabetes?" If  yes, ask: "Have you missed taking any pills recently?"     Metformin - patient has actually been doubling dose 8. OTHER SYMPTOMS: "Do you have any symptoms?" (e.g., fever, frequent urination, difficulty breathing, dizziness, weakness, vomiting)     Patient was a little light headed last night- feels better this morning 9. PREGNANCY: "Is there any chance you are pregnant?" "When was your last menstrual period?"     n/a  Protocols used: DIABETES - HIGH BLOOD SUGAR-A-AH

## 2017-06-10 NOTE — Telephone Encounter (Signed)
Patient is calling to report she is out of test strips and lancets. She would like that as soon as possible.

## 2017-06-10 NOTE — Telephone Encounter (Signed)
FYI

## 2017-06-12 ENCOUNTER — Encounter (INDEPENDENT_AMBULATORY_CARE_PROVIDER_SITE_OTHER): Payer: Self-pay

## 2017-06-12 ENCOUNTER — Encounter: Payer: Self-pay | Admitting: Physician Assistant

## 2017-06-12 ENCOUNTER — Telehealth: Payer: Self-pay | Admitting: Internal Medicine

## 2017-06-12 ENCOUNTER — Other Ambulatory Visit (INDEPENDENT_AMBULATORY_CARE_PROVIDER_SITE_OTHER): Payer: Medicare HMO

## 2017-06-12 ENCOUNTER — Encounter: Payer: Self-pay | Admitting: Internal Medicine

## 2017-06-12 ENCOUNTER — Ambulatory Visit: Payer: Medicare HMO | Admitting: Physician Assistant

## 2017-06-12 VITALS — BP 132/70 | HR 80 | Ht 62.25 in | Wt 170.1 lb

## 2017-06-12 DIAGNOSIS — Z86010 Personal history of colonic polyps: Secondary | ICD-10-CM

## 2017-06-12 DIAGNOSIS — K922 Gastrointestinal hemorrhage, unspecified: Secondary | ICD-10-CM

## 2017-06-12 DIAGNOSIS — D649 Anemia, unspecified: Secondary | ICD-10-CM | POA: Diagnosis not present

## 2017-06-12 DIAGNOSIS — Z7901 Long term (current) use of anticoagulants: Secondary | ICD-10-CM | POA: Diagnosis not present

## 2017-06-12 LAB — CBC WITH DIFFERENTIAL/PLATELET
Basophils Absolute: 0 10*3/uL (ref 0.0–0.1)
Basophils Relative: 0.1 % (ref 0.0–3.0)
EOS ABS: 0.1 10*3/uL (ref 0.0–0.7)
Eosinophils Relative: 0.8 % (ref 0.0–5.0)
HCT: 36 % (ref 36.0–46.0)
HEMOGLOBIN: 11.5 g/dL — AB (ref 12.0–15.0)
Lymphocytes Relative: 20.2 % (ref 12.0–46.0)
Lymphs Abs: 2.3 10*3/uL (ref 0.7–4.0)
MCHC: 31.9 g/dL (ref 30.0–36.0)
MCV: 84.1 fl (ref 78.0–100.0)
MONO ABS: 1.1 10*3/uL — AB (ref 0.1–1.0)
Monocytes Relative: 10 % (ref 3.0–12.0)
NEUTROS PCT: 68.9 % (ref 43.0–77.0)
Neutro Abs: 7.7 10*3/uL (ref 1.4–7.7)
Platelets: 283 10*3/uL (ref 150.0–400.0)
RBC: 4.28 Mil/uL (ref 3.87–5.11)
RDW: 15.4 % (ref 11.5–15.5)
WBC: 11.2 10*3/uL — AB (ref 4.0–10.5)

## 2017-06-12 MED ORDER — PEG-KCL-NACL-NASULF-NA ASC-C 140 G PO SOLR: 1.0000 | Freq: Once | ORAL | 0 refills | Status: AC

## 2017-06-12 NOTE — Progress Notes (Signed)
Physician assistant assessment and plans noted

## 2017-06-12 NOTE — Progress Notes (Signed)
Subjective:    Patient ID: Jasmine Carlson, female    DOB: [birth year], 77 y.o.   MRN: [id number]  HPI Jasmine Carlson is a pleasant 77 year old African-American female, new to GI today referred by Jasmine Carlson M.D. for GI evaluation after recent hospitalization in [place] referred to anemia for an acute GI bleed. Patient has history of stage I a breast cancer 2016, hypertension, adult-onset diabetes mellitus. She says for 5 years ago she had a TIA and has been on Plavix. She was apparently living in [place] at that time. She says she has had prior colonoscopy and that was also done in [place] around 5 years ago. She believes she had polyps and probably was told to follow-up but she moved.   She has no prior history of GI bleeding. Patient states that she had gone to visit friends, and was playing bingo when she had abrupt onset of very minimal abdominal discomfort followed by large volume bright red blood per rectum filling the commode. She says she had about 6 episodes with a very large amount of bright red blood and some clots. She was transported by ambulance to the hospital in [place], and apparently was admitted for 2 days. Her admitting hemoglobin was 8.4 weight let's 143 WBC of 5.62. She did not require transfusion. She states that her bleeding subsided shortly after she was admitted. Her Plavix was stopped, and is still on hold per her PCP. She did not undergo any GI evaluation or imaging. Patient has not had any further evidence of GI bleeding since discharge from the hospital. She says her bowel movements have been normal, she has no complaints of abdominal pain or changes in bowel habits. Her energy level has been pretty good. She did have repeat CBC on 05/27/2017 with hemoglobin of 9.1 hematocrit of 27.9 MCV of 83. Looking back hemoglobin was 11.3 in August 2018. Family history negative for colon cancer.  Review of Systems Pertinent positive and negative review of systems were noted in the above  HPI section.  All other review of systems was otherwise negative.  Outpatient Encounter Medications as of 06/12/2017  Medication Sig  . anastrozole (ARIMIDEX) 1 MG tablet Take 1 tablet (1 mg total) by mouth daily.  . carvedilol (COREG) 6.25 MG tablet Take 1 tablet (6.25 mg total) by mouth 2 (two) times daily with a meal.  . clopidogrel (PLAVIX) 75 MG tablet Take 1 tablet (75 mg total) by mouth daily.  . diclofenac sodium (VOLTAREN) 1 % GEL Apply 2 g topically 4 (four) times daily as needed.  Marland Kitchen glucose blood (ONETOUCH VERIO) test strip Patient is to check blood sugar 1-2 times daily. Dx. E11.9  . losartan (COZAAR) 100 MG tablet Take 1 tablet (100 mg total) by mouth daily.  . metFORMIN (GLUCOPHAGE) 500 MG tablet Take 1 tablet (500 mg total) by mouth daily with supper.  Jasmine Carlson Patient is to check blood sugar 1-2 times daily.  . simvastatin (ZOCOR) 40 MG tablet Take 1 tablet (40 mg total) by mouth every evening.  Marland Kitchen PEG-KCl-NaCl-NaSulf-Na Asc-C (PLENVU) 140 g SOLR Take 1 kit by mouth once for 1 dose. Take as directed for colonoscopy.   No facility-administered encounter medications on file as of 06/12/2017.    No Known Allergies Patient Active Problem List   Diagnosis Date Noted  . History of TIA (transient ischemic attack) 05/27/2017  . Arthritis 05/27/2017  . Breast cancer of upper-inner quadrant of right female breast (Jasmine Carlson) 01/02/2017   Social  History   Socioeconomic History  . Marital status: Single    Spouse name: Not on file  . Number of children: 5  . Years of education: Not on file  . Highest education level: Not on file  Social Needs  . Financial resource strain: Not on file  . Food insecurity - worry: Not on file  . Food insecurity - inability: Not on file  . Transportation needs - medical: Not on file  . Transportation needs - non-medical: Not on file  Occupational History  . Occupation: retired  Tobacco Use  . Smoking status: Never Smoker  .  Smokeless tobacco: Never Used  Substance and Sexual Activity  . Alcohol use: No    Comment: Pt quit drinking a long time ago.  . Drug use: No  . Sexual activity: No  Other Topics Concern  . Not on file  Social History Narrative  . Not on file    Ms. Jasmine Carlson's family history includes Breast cancer in her sister; Diabetes in her mother; HIV in her son; Heart attack in her mother; Hyperlipidemia in her mother; Hypertension in her mother; Throat cancer in her brother, brother, and father.      Objective:    Vitals:   06/12/17 1324  BP: 132/70  Pulse: 80    Physical Exam; well-developed elderly African-American female in no acute distress, pleasant. Younger than stated age blood pressure 132/70 pulse 80, height 5 foot 2, weight 170, BMI of 30.8. HEENT; nontraumatic normocephalic EOMI PERRLA sclera anicteric, Cardiovascular ;regular rate and rhythm with S1-S2 no murmur or gallop, Pulmonary ;clear bilaterally, Abdomen ;soft, non-tender nondistended bowel sounds are active there is no palpable mass or hepatosplenomegaly, Rectal;exam not done, Extremities ;no clubbing cyanosis or edema skin warm and dry, Neuropsych; mood and affect appropriate       Assessment & Plan:   #71 77 year old African-American female with recent acute lower GI bleed which occurred while she was visiting friends near [place] on 05/27/2017. Acute bleeding occurred in setting of chronic Plavix use. Admission hemoglobin was 8.4. She did not require transfusion and bleeding subsided shortly after admission. No GI evaluation done while she was hospitalized. Her history is very consistent with an acute diverticular hemorrhage. Will need to rule out other occult colon lesion or sources for acute GI blood loss  #2; Pt reports prior colonoscopy perhaps 5 years ago with finding of polyps procedure done in [place] #3 history of TIA about 5 years ago-had been maintained on Plavix, currently on hold #4 history of  stage I breast cancer 2016 #5 hypertension #6 adult-onset diabetes mellitus  Plan; repeat CBC today Patient will restart Plavix. Will schedule for colonoscopy with Dr. Scarlette Carlson. Procedure was discussed in detail with the patient including indications risks and benefits and she is agreeable to proceed. She was instructed to stop the Plavix 5 days prior to the procedure. She is advised to call should she have any problems with recurrent bleeding.  Pragya Lofaso Genia Harold PA-C 06/12/2017   Cc: [name], MD

## 2017-06-12 NOTE — Patient Instructions (Signed)
Please go to the basement level to have your labs drawn. You can restart your Plavix.  Stop it on 06-20-2017 and stay off until after the Colonoscopy. Dr. Henrene Pastor will tell you with to start it again.   You have been scheduled for a colonoscopy. Please follow written instructions given to you at your visit today.  Please pick up your prep supplies at the pharmacy within the next 1-3 days. If you use inhalers (even only as needed), please bring them with you on the day of your procedure.

## 2017-06-13 NOTE — Telephone Encounter (Signed)
Told patient I would leave a sample of Plenvu up front to be picked up.  Patient agreed.

## 2017-06-18 ENCOUNTER — Inpatient Hospital Stay: Payer: Medicare HMO | Attending: Hematology

## 2017-06-18 DIAGNOSIS — C50211 Malignant neoplasm of upper-inner quadrant of right female breast: Secondary | ICD-10-CM

## 2017-06-18 DIAGNOSIS — Z79811 Long term (current) use of aromatase inhibitors: Secondary | ICD-10-CM | POA: Diagnosis not present

## 2017-06-18 DIAGNOSIS — Z923 Personal history of irradiation: Secondary | ICD-10-CM | POA: Insufficient documentation

## 2017-06-18 DIAGNOSIS — Z452 Encounter for adjustment and management of vascular access device: Secondary | ICD-10-CM | POA: Insufficient documentation

## 2017-06-18 DIAGNOSIS — Z170 Estrogen receptor positive status [ER+]: Secondary | ICD-10-CM | POA: Insufficient documentation

## 2017-06-18 DIAGNOSIS — Z9221 Personal history of antineoplastic chemotherapy: Secondary | ICD-10-CM | POA: Diagnosis not present

## 2017-06-18 MED ORDER — HEPARIN SOD (PORK) LOCK FLUSH 100 UNIT/ML IV SOLN
500.0000 [IU] | Freq: Once | INTRAVENOUS | Status: AC
  Administered 2017-06-18: 500 [IU]
  Filled 2017-06-18: qty 5

## 2017-06-18 MED ORDER — SODIUM CHLORIDE 0.9% FLUSH
10.0000 mL | Freq: Once | INTRAVENOUS | Status: AC
  Administered 2017-06-18: 10 mL
  Filled 2017-06-18: qty 10

## 2017-06-25 ENCOUNTER — Encounter: Payer: Self-pay | Admitting: Internal Medicine

## 2017-06-25 ENCOUNTER — Other Ambulatory Visit: Payer: Self-pay

## 2017-06-25 ENCOUNTER — Ambulatory Visit (AMBULATORY_SURGERY_CENTER): Payer: Medicare HMO | Admitting: Internal Medicine

## 2017-06-25 VITALS — BP 144/68 | HR 61 | Temp 97.1°F | Resp 13 | Ht 62.0 in | Wt 170.0 lb

## 2017-06-25 DIAGNOSIS — D122 Benign neoplasm of ascending colon: Secondary | ICD-10-CM

## 2017-06-25 DIAGNOSIS — K922 Gastrointestinal hemorrhage, unspecified: Secondary | ICD-10-CM

## 2017-06-25 DIAGNOSIS — K921 Melena: Secondary | ICD-10-CM

## 2017-06-25 DIAGNOSIS — Z1211 Encounter for screening for malignant neoplasm of colon: Secondary | ICD-10-CM | POA: Diagnosis not present

## 2017-06-25 DIAGNOSIS — D120 Benign neoplasm of cecum: Secondary | ICD-10-CM | POA: Diagnosis not present

## 2017-06-25 MED ORDER — SODIUM CHLORIDE 0.9 % IV SOLN: 500.0000 mL | Freq: Once | INTRAVENOUS | Status: AC

## 2017-06-25 NOTE — Progress Notes (Signed)
Called to room to assist during endoscopic procedure.  Patient ID and intended procedure confirmed with present staff. Received instructions for my participation in the procedure from the performing physician.  

## 2017-06-25 NOTE — Progress Notes (Signed)
A/ox3 pleased with MAC, report to [place]

## 2017-06-25 NOTE — Op Note (Signed)
[facility] Patient Name: Jasmine Carlson Procedure Date: 06/25/2017 8:24 AM MRN: [id number] Endoscopist: Docia Chuck. Henrene Pastor , MD Age: 77 Referring MD:  Date of Birth: Jan 31, 1941 Gender: Female Account #: [id number] Procedure:                Colonoscopy, With cold snare polypectomy x 2 Indications:              Hematochezia. Presented with acute lower GI                            bleeding in [place] late January 2019.                            Previous colonoscopies elsewhere (no details) Medicines:                Monitored Anesthesia Care Procedure:                Pre-Anesthesia Assessment:                           - Prior to the procedure, a History and Physical                            was performed, and patient medications and                            allergies were reviewed. The patient's tolerance of                            previous anesthesia was also reviewed. The risks                            and benefits of the procedure and the sedation                            options and risks were discussed with the patient.                            All questions were answered, and informed consent                            was obtained. Prior Anticoagulants: The patient has                            taken Plavix (clopidogrel), last dose was 5 days                            prior to procedure. ASA Grade Assessment: III - A                            patient with severe systemic disease. After                            reviewing the risks and benefits, the patient was  deemed in satisfactory condition to undergo the                            procedure.                           After obtaining informed consent, the colonoscope                            was passed under direct vision. Throughout the                            procedure, the patient's blood pressure, pulse, and                            oxygen saturations were  monitored continuously. The                            Colonoscope was introduced through the anus and                            advanced to the the cecum, identified by                            appendiceal orifice and ileocecal valve. The                            ileocecal valve, appendiceal orifice, and rectum                            were photographed. The quality of the bowel                            preparation was excellent. The colonoscopy was                            performed without difficulty. The patient tolerated                            the procedure well. The bowel preparation used was                            SUPREP. Scope In: 8:58:24 AM Scope Out: 9:12:53 AM Scope Withdrawal Time: 0 hours 12 minutes 15 seconds  Total Procedure Duration: 0 hours 14 minutes 29 seconds  Findings:                 Two sessile polyps were found in the ascending                            colon and cecum. The polyps were 5 to 8 mm in size.                            These polyps were removed with a cold snare.  Resection and retrieval were complete.                           Multiple small and large-mouthed diverticula were                            found in the entire colon.                           Internal hemorrhoids were found during retroflexion.                           The exam was otherwise without abnormality on                            direct and retroflexion views. Complications:            No immediate complications. Estimated blood loss:                            None. Estimated Blood Loss:     Estimated blood loss: none. Impression:               - Two 5 to 8 mm polyps in the ascending colon and                            in the cecum, removed with a cold snare. Resected                            and retrieved.                           - Diverticulosis in the entire examined colon.                            Recent GI bleed  self-limited diverticular bleed.                           - Internal hemorrhoids.                           - The examination was otherwise normal on direct                            and retroflexion views. Recommendation:           - Repeat colonoscopy is not recommended for                            surveillance (age).                           - Resume Plavix (clopidogrel) today at prior dose.                           - Patient has a contact number available for  emergencies. The signs and symptoms of potential                            delayed complications were discussed with the                            patient. Return to normal activities tomorrow.                            Written discharge instructions were provided to the                            patient.                           - Resume previous diet.                           - Continue present medications.                           - Await pathology results. Docia Chuck. Henrene Pastor, MD 06/25/2017 9:22:34 AM This report has been signed electronically.

## 2017-06-25 NOTE — Patient Instructions (Signed)
YOU HAD AN ENDOSCOPIC PROCEDURE TODAY AT [facility]:   Refer to the procedure report that was given to you for any specific questions about what was found during the examination.  If the procedure report does not answer your questions, please call your gastroenterologist to clarify.  If you requested that your care partner not be given the details of your procedure findings, then the procedure report has been included in a sealed envelope for you to review at your convenience later.  YOU SHOULD EXPECT: Some feelings of bloating in the abdomen. Passage of more gas than usual.  Walking can help get rid of the air that was put into your GI tract during the procedure and reduce the bloating. If you had a lower endoscopy (such as a colonoscopy or flexible sigmoidoscopy) you may notice spotting of blood in your stool or on the toilet paper. If you underwent a bowel prep for your procedure, you may not have a normal bowel movement for a few days.  Please Note:  You might notice some irritation and congestion in your nose or some drainage.  This is from the oxygen used during your procedure.  There is no need for concern and it should clear up in a day or so.  SYMPTOMS TO REPORT IMMEDIATELY:   Following lower endoscopy (colonoscopy or flexible sigmoidoscopy):  Excessive amounts of blood in the stool  Significant tenderness or worsening of abdominal pains  Swelling of the abdomen that is new, acute  Fever of 100F or higher  For urgent or emergent issues, a gastroenterologist can be reached at any hour by calling [phone].   DIET:  We do recommend a small meal at first, but then you may proceed to your regular diet.  Drink plenty of fluids but you should avoid alcoholic beverages for 24 hours.  MEDICATIONS: Continue present medications. Resume Plavix (clopidogrel) today at prior dose.  Please see handouts given to you by your recovery nurse.  ACTIVITY:  You should plan to take  it easy for the rest of today and you should NOT DRIVE or use heavy machinery until tomorrow (because of the sedation medicines used during the test).    FOLLOW UP: Our staff will call the number listed on your records the next business day following your procedure to check on you and address any questions or concerns that you may have regarding the information given to you following your procedure. If we do not reach you, we will leave a message.  However, if you are feeling well and you are not experiencing any problems, there is no need to return our call.  We will assume that you have returned to your regular daily activities without incident.  If any biopsies were taken you will be contacted by phone or by letter within the next 1-3 weeks.  Please call us at [phone] if you have not heard about the biopsies in 3 weeks.   Thank you for allowing [place] to provide for your healthcare needs today.   SIGNATURES/CONFIDENTIALITY: You and/or your care partner have signed paperwork which will be entered into your electronic medical record.  These signatures attest to the fact that that the information above on your After Visit Summary has been reviewed and is understood.  Full responsibility of the confidentiality of this discharge information lies with you and/or your care-partner.

## 2017-06-25 NOTE — Progress Notes (Signed)
I have reviewed the patient's medical history in detail and updated the computerized patient record.

## 2017-06-26 ENCOUNTER — Telehealth: Payer: Self-pay | Admitting: *Deleted

## 2017-06-26 NOTE — Telephone Encounter (Signed)
  Follow up Call-  Call back number 06/25/2017  Post procedure Call Back phone  # [phone]  Permission to leave phone message Yes     Patient questions:  Do you have a fever, pain , or abdominal swelling? No. Pain Score  0 *  Have you tolerated food without any problems? Yes.    Have you been able to return to your normal activities? Yes.    Do you have any questions about your discharge instructions: Diet   No. Medications  No. Follow up visit  No.  Do you have questions or concerns about your Care? No.  Actions: * If pain score is 4 or above: No action needed, pain <4.

## 2017-07-07 ENCOUNTER — Ambulatory Visit: Payer: Medicare Other | Admitting: Hematology

## 2017-07-07 ENCOUNTER — Encounter: Payer: Self-pay | Admitting: Internal Medicine

## 2017-07-09 ENCOUNTER — Ambulatory Visit (INDEPENDENT_AMBULATORY_CARE_PROVIDER_SITE_OTHER): Payer: Medicare HMO | Admitting: Family Medicine

## 2017-07-09 ENCOUNTER — Encounter: Payer: Self-pay | Admitting: Family Medicine

## 2017-07-09 VITALS — BP 158/76 | HR 79 | Temp 97.9°F | Ht 62.0 in | Wt 169.0 lb

## 2017-07-09 DIAGNOSIS — Z8673 Personal history of transient ischemic attack (TIA), and cerebral infarction without residual deficits: Secondary | ICD-10-CM | POA: Diagnosis not present

## 2017-07-09 DIAGNOSIS — E119 Type 2 diabetes mellitus without complications: Secondary | ICD-10-CM

## 2017-07-09 DIAGNOSIS — I10 Essential (primary) hypertension: Secondary | ICD-10-CM | POA: Diagnosis not present

## 2017-07-09 LAB — POCT GLYCOSYLATED HEMOGLOBIN (HGB A1C): Hemoglobin A1C: 7.7

## 2017-07-09 MED ORDER — SIMVASTATIN 40 MG PO TABS
40.0000 mg | ORAL_TABLET | Freq: Every evening | ORAL | 3 refills | Status: DC
Start: 2017-07-09 — End: 2018-02-24

## 2017-07-09 MED ORDER — METFORMIN HCL 500 MG PO TABS: 500.0000 mg | ORAL_TABLET | Freq: Every day | ORAL | 3 refills | Status: DC

## 2017-07-09 MED ORDER — CARVEDILOL 6.25 MG PO TABS: 6.2500 mg | ORAL_TABLET | Freq: Two times a day (BID) | ORAL | 3 refills | Status: DC

## 2017-07-09 MED ORDER — LOSARTAN POTASSIUM 100 MG PO TABS: 100.0000 mg | ORAL_TABLET | Freq: Every day | ORAL | 3 refills | Status: DC

## 2017-07-09 MED ORDER — CLOPIDOGREL BISULFATE 75 MG PO TABS: 75.0000 mg | ORAL_TABLET | Freq: Every day | ORAL | 3 refills | Status: DC

## 2017-07-09 NOTE — Progress Notes (Signed)
Subjective:    Patient ID: Jasmine Carlson, female    DOB: Sep 30, 1940, 77 y.o.   MRN: [id number]  Chief Complaint  Patient presents with  . Follow-up    HPI Patient was seen today for follow-up.  Since last OFV pt was seen by GI.  She underwent colonoscopy after having GIB out of town.  Pt states she has been doing well since the procedure.  Pt restarted her Plavix after the procedure and denies seeing any blood in her stools.  Pt denies dizzines or palpitations.  Pt does endorse continued knee pain.  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  She recently had a steroid injection in her knee which helped.  She is unsure voltaren gel is doing much for her.  Pt taking metformin 500 mg daily.  Patient states in the past she was on metformin 500 mg twice daily but this was stopped as her "numbers were good".  Patient states she knows she is been eating things she should not such as fried potatoes.  Patient states sometimes if she does not feel well she will take a second metformin.  Pt requesting a refill on all her meds.  Past Medical History:  Diagnosis Date  . Arthritis   . Breast cancer ([facility])   . Colon polyps   . Diabetes mellitus without complication ([facility])   . GI bleed   . Hyperlipidemia   . Hypertension   . Stroke ([place])     No Known Allergies  ROS General: Denies fever, chills, night sweats, changes in weight, changes in appetite HEENT: Denies headaches, ear pain, changes in vision, rhinorrhea, sore throat CV: Denies CP, palpitations, SOB, orthopnea Pulm: Denies SOB, cough, wheezing GI: Denies abdominal pain, nausea, vomiting, diarrhea, constipation GU: Denies dysuria, hematuria, frequency, vaginal discharge Msk: Denies muscle cramps   + joint pains Neuro: Denies weakness, numbness, tingling Skin: Denies rashes, bruising Psych: Denies depression, anxiety, hallucinations     Objective:    Blood pressure (!) 158/76, pulse 79, temperature 97.9 F (36.6 C), temperature source  Oral, height 5\' 2"  (1.575 m), weight 169 lb (76.7 kg), SpO2 96 %.   Gen. Pleasant, well-nourished, in no distress, normal affect   HEENT: [place]/AT, face symmetric, conjunctiva clear, no scleral icterus, PERRLA, nares patent without drainage Lungs: no accessory muscle use, CTAB, no wheezes or rales Cardiovascular: RRR, no m/r/g, no peripheral edema Abdomen: BS present, soft, NT/ND Neuro:  A&Ox3, CN II-XII intact, normal gait   Wt Readings from Last 3 Encounters:  07/09/17 169 lb (76.7 kg)  06/25/17 170 lb (77.1 kg)  06/12/17 170 lb 2 oz (77.2 kg)    Lab Results  Component Value Date   WBC 11.2 (H) 06/12/2017   HGB 11.5 (L) 06/12/2017   HCT 36.0 06/12/2017   PLT 283.0 06/12/2017   GLUCOSE 118 01/02/2017   ALT 11 01/02/2017   AST 15 01/02/2017   NA 138 01/02/2017   K 4.3 01/02/2017   CL 102 12/06/2016   CREATININE 1.1 01/02/2017   BUN 14.2 01/02/2017   CO2 24 01/02/2017    Assessment/Plan:  Controlled type 2 diabetes mellitus without complication, without long-term current use of insulin (HCC) -Hemoglobin A1c 7.7% -Discussed increasing metformin, twice daily, however patient declined this option. -Patient advised to decrease intake of carbs and sugar and continue to increase physical activity. -Patient continue checking FSBS daily keep a log to bring with her clinic - Plan: metFORMIN (GLUCOPHAGE) 500 MG tablet -will need foot  exam at next [facility].  History of TIA (transient ischemic attack)  - Plan: clopidogrel (PLAVIX) 75 MG tablet, simvastatin (ZOCOR) 40 MG tablet  Essential hypertension  -elevated. -We will recheck bp. -pt advised to check bp at home and keep a log. -continue lifestyle modifications. -if remains elevated will need to add additional medication. - Plan: losartan (COZAAR) 100 MG tablet, carvedilol (COREG) 6.25 MG tablet  F/u in 3-4 mo, sooner if needed.  Will need foot exam at next OFV.  Grier Mitts, MD

## 2017-07-09 NOTE — Patient Instructions (Addendum)

## 2017-07-10 ENCOUNTER — Ambulatory Visit: Payer: Medicare HMO | Admitting: Family Medicine

## 2017-07-23 ENCOUNTER — Telehealth: Payer: Self-pay | Admitting: *Deleted

## 2017-07-23 NOTE — Telephone Encounter (Signed)
Voicemail received requesting "appointment for port flush".  Message forwarded to collaborative and high priority scheduler for further assistance and patient communication.

## 2017-07-30 ENCOUNTER — Inpatient Hospital Stay: Payer: Medicare HMO | Attending: Hematology

## 2017-07-30 DIAGNOSIS — Z79811 Long term (current) use of aromatase inhibitors: Secondary | ICD-10-CM | POA: Diagnosis not present

## 2017-07-30 DIAGNOSIS — Z452 Encounter for adjustment and management of vascular access device: Secondary | ICD-10-CM | POA: Insufficient documentation

## 2017-07-30 DIAGNOSIS — Z9221 Personal history of antineoplastic chemotherapy: Secondary | ICD-10-CM | POA: Insufficient documentation

## 2017-07-30 DIAGNOSIS — C50211 Malignant neoplasm of upper-inner quadrant of right female breast: Secondary | ICD-10-CM

## 2017-07-30 DIAGNOSIS — Z170 Estrogen receptor positive status [ER+]: Secondary | ICD-10-CM | POA: Insufficient documentation

## 2017-07-30 DIAGNOSIS — Z923 Personal history of irradiation: Secondary | ICD-10-CM | POA: Diagnosis not present

## 2017-07-30 MED ORDER — HEPARIN SOD (PORK) LOCK FLUSH 100 UNIT/ML IV SOLN
500.0000 [IU] | Freq: Once | INTRAVENOUS | Status: AC
  Administered 2017-07-30: 500 [IU]
  Filled 2017-07-30: qty 5

## 2017-07-30 MED ORDER — SODIUM CHLORIDE 0.9% FLUSH
10.0000 mL | Freq: Once | INTRAVENOUS | Status: AC
  Administered 2017-07-30: 10 mL
  Filled 2017-07-30: qty 10

## 2017-08-14 DIAGNOSIS — Z6830 Body mass index (BMI) 30.0-30.9, adult: Secondary | ICD-10-CM | POA: Diagnosis not present

## 2017-08-14 DIAGNOSIS — Z79811 Long term (current) use of aromatase inhibitors: Secondary | ICD-10-CM | POA: Diagnosis not present

## 2017-08-14 DIAGNOSIS — Z7984 Long term (current) use of oral hypoglycemic drugs: Secondary | ICD-10-CM | POA: Diagnosis not present

## 2017-08-14 DIAGNOSIS — Z7902 Long term (current) use of antithrombotics/antiplatelets: Secondary | ICD-10-CM | POA: Diagnosis not present

## 2017-08-14 DIAGNOSIS — E785 Hyperlipidemia, unspecified: Secondary | ICD-10-CM | POA: Diagnosis not present

## 2017-08-14 DIAGNOSIS — G8929 Other chronic pain: Secondary | ICD-10-CM | POA: Diagnosis not present

## 2017-08-14 DIAGNOSIS — E1165 Type 2 diabetes mellitus with hyperglycemia: Secondary | ICD-10-CM | POA: Diagnosis not present

## 2017-08-14 DIAGNOSIS — I10 Essential (primary) hypertension: Secondary | ICD-10-CM | POA: Diagnosis not present

## 2017-08-14 DIAGNOSIS — M1990 Unspecified osteoarthritis, unspecified site: Secondary | ICD-10-CM | POA: Diagnosis not present

## 2017-08-14 DIAGNOSIS — E669 Obesity, unspecified: Secondary | ICD-10-CM | POA: Diagnosis not present

## 2017-09-08 ENCOUNTER — Telehealth: Payer: Self-pay | Admitting: Family Medicine

## 2017-09-08 NOTE — Telephone Encounter (Signed)
Copied from [place] [phone]. Topic: General - Other >> Sep 08, 2017  2:32 PM Carolyn Stare wrote:  Pt call to say [facility] is faxing over paperwork for Dr Volanda Napoleon to fill out. They have contacted pt and she said she is the one that is ordering the supplies. She is asking for the paperwork to be filled out and sent back  Pt phone number [phone]

## 2017-09-09 DIAGNOSIS — M25562 Pain in left knee: Secondary | ICD-10-CM | POA: Diagnosis not present

## 2017-09-09 DIAGNOSIS — M170 Bilateral primary osteoarthritis of knee: Secondary | ICD-10-CM | POA: Diagnosis not present

## 2017-09-09 DIAGNOSIS — M25561 Pain in right knee: Secondary | ICD-10-CM | POA: Diagnosis not present

## 2017-09-10 ENCOUNTER — Inpatient Hospital Stay: Payer: Medicare HMO | Attending: Hematology

## 2017-09-10 DIAGNOSIS — C50211 Malignant neoplasm of upper-inner quadrant of right female breast: Secondary | ICD-10-CM

## 2017-09-10 DIAGNOSIS — Z923 Personal history of irradiation: Secondary | ICD-10-CM | POA: Insufficient documentation

## 2017-09-10 DIAGNOSIS — Z79811 Long term (current) use of aromatase inhibitors: Secondary | ICD-10-CM | POA: Insufficient documentation

## 2017-09-10 DIAGNOSIS — Z170 Estrogen receptor positive status [ER+]: Secondary | ICD-10-CM | POA: Diagnosis not present

## 2017-09-10 DIAGNOSIS — Z9221 Personal history of antineoplastic chemotherapy: Secondary | ICD-10-CM | POA: Diagnosis not present

## 2017-09-10 DIAGNOSIS — Z452 Encounter for adjustment and management of vascular access device: Secondary | ICD-10-CM | POA: Diagnosis not present

## 2017-09-10 MED ORDER — HEPARIN SOD (PORK) LOCK FLUSH 100 UNIT/ML IV SOLN
500.0000 [IU] | Freq: Once | INTRAVENOUS | Status: AC
  Administered 2017-09-10: 500 [IU]
  Filled 2017-09-10: qty 5

## 2017-09-10 MED ORDER — SODIUM CHLORIDE 0.9% FLUSH
10.0000 mL | Freq: Once | INTRAVENOUS | Status: AC
  Administered 2017-09-10: 10 mL
  Filled 2017-09-10: qty 10

## 2017-09-10 NOTE — Telephone Encounter (Signed)
Paperwork has not been received. Patient notified and she will contact [facility] to have them resend the information.

## 2017-10-01 DIAGNOSIS — M1711 Unilateral primary osteoarthritis, right knee: Secondary | ICD-10-CM | POA: Diagnosis not present

## 2017-10-01 DIAGNOSIS — M25561 Pain in right knee: Secondary | ICD-10-CM | POA: Diagnosis not present

## 2017-10-07 DIAGNOSIS — M1711 Unilateral primary osteoarthritis, right knee: Secondary | ICD-10-CM | POA: Diagnosis not present

## 2017-10-07 DIAGNOSIS — M25561 Pain in right knee: Secondary | ICD-10-CM | POA: Diagnosis not present

## 2017-10-07 NOTE — Progress Notes (Signed)
[place]  [phone] [phone]  Clinic Follow Up Note   Patient Care Team: [name], MD as PCP - General (Family Medicine)   Date of Service:  10/08/2017  CHIEF COMPLAINTS:  Follow up of Malignant neoplasm of UIQ right breast   Oncology History   Cancer Staging Breast cancer of upper-inner quadrant of right female breast [facility]) Staging form: Breast, AJCC 8th Edition - Pathologic stage from 08/02/2014: Stage IA (pT1c, pN0, cM0, G2, ER: Positive, PR: Positive, HER2: Positive) - Signed by Truitt Merle, MD on 01/04/2017 - Clinical: No stage assigned - Unsigned       Breast cancer of upper-inner quadrant of right female breast ([place])   05/25/2014 Mammogram    Suspicious lesion at 3 o'clock position in the right breast      06/15/2014 Breast US    Suspicious nodule, a spiculated hypoechoic mass, 1.1 cm at the 1 o'clock position      06/30/2014 Initial Biopsy    US Guided Core Biopsy: Invasive Ductal Carcinoma, [place]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 Tamera Punt at [facility] in [place], [place]      08/02/2014 Pathology Results    Right breast lumpectomy: Invasive ductal carcinoma, Duanne Moron Grade 2; size or largest invasive carcinoma 1.4 cm; Sentinel lymph nodes 2/2 free of tumor, margins free of tumor       09/22/2014 Echocardiogram    MUGA scan: normal ejection fraction of 57.7%      10/01/2014 - 10/03/2015 Adjuvant Chemotherapy    Adjuvant weekly Taxol from 05/02/76 to 06/07/54 complicated by fatigue, diarrhea, and leukopenia requiring treatment delays and  dosage adjustments. Records indicate she received 8 of 12 doses. Completed adjuvant Herceptin q3 weeks from 10/01/14 to 10/03/15.       06/12/2015 - 08/04/2015 Radiation Therapy    Received 4,860 cGy to right breast in 27 fractions Received 1,600 cGy boost in 8 fractions       09/12/2015 -  Anti-estrogen oral therapy    She began Arimidex after completing adjuvant radiation      06/25/2016 Imaging    CT Chest, Abdomen, Pelvis with contrast, compared with PET/CT on 04/25/2015 and CT 09/06/2014 - Impression: Chest: 1. Unchanged multiple subcentimeter pulmonary nodules bilaterally.there are no new pulmonary nodules. 2. No thoracic lymphadenopathy. 3. New right lung post radiation changes. 4. New diffuse right breast skin thickening suggestive of post radiation changes. Abdomen/pelvis 1. No new mass or lymphadenopathy in the abdomen and pelvis, no CT evidence of metastatic disease. 2. Unchanged adjacent soft tissue nodules inferior to the right hepatic lobe. 3. Colonic diverticulosis. 4. Status post hysterectomy.      06/29/2016 Mammogram    Digital bilateral mammogram with CAD and TOMO findings: the breast parenchyma demonstrates scattered fibroglandular densities. There are no suspicious calcifications, masses, architectural distortion or skin thickening. Surgical clips are present in the right breast at posterior depth. Vascular calcifications are present. No mammographic evidence of malignancy or referred is is gravida         HISTORY OF PRESENTING ILLNESS:  Jasmine Carlson 77 y.o. female is  here because of right breast cancer that was found on an annual screening mammogram on 05/25/2014 in [place]. A lump was not palpable. She had fatigue at the time of initial diagnosis. Pt was evaluated at [facility] with Medical Oncology and Radiation Oncology.   Pt had a mammogram completed on 05/25/2014 that showed suspicious lesion at the 3 o'clock position in the right breast. She  underwent US on 06/15/2014 that showed suspicious nodule, a spiculated, hypoechoic mass, 1.1 cm at the 1 o'clock position. 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 Pt had a right breast lumpectomy completed on 08/02/2014 with results revealing: Invasive ductal carcinoma, grade 2 with surgical margins free of carcinoma. Of the 2 lymphnodes excised, all were negative with superior, inferior, medial, and lateral margins negative. final stage was T1cN0 dx. ER 100%+, PR 75-80%+ and HER2/neu amplified by FISH. Pt had a DEXA scan completed on 09/06/2014: IMPRESSION: Increased activity noted in the cervical spine which is most likely secondary to degenerative changes. Followup study is recommended. No abnormal long bone activity. Degenerative changes noted in the knees and feet. Pt also underwent a CT chest on 09/06/14 with results revealing:  IMPRESSION: 1. Multiple pulmonary nodules bilaterally up to 5 mm are nonspecific and metastases cannot be excluded. Recommend a close f/u chest CT in 3 months to assess stability. 2. No thoracic lymphadenopathy. 3. Soft tissue mass in the right breast as described is consistent with the known primary malignancy. Pt had a follow up CT CAP completed on 09/13/14 with results: IMPRESSION: Adjacent soft tissue nodules inferior to the right hepatic lobe posteriorly up to 0.9 cm are nonspecific. Continued CT followup is recommended in this patient with breast cancer. 2. There is otherwise no mass or LAD in the abdomen and pelvis. 3. Colonic diverticulosis. 4. status post hysterectomy.   She completed chemotherapy with Taxol and Herceptin for 3 months, then completed single agent Herceptin on 10/03/2015. She remembers tolerating this well without persistent side effects.She was treated with adjuvant external beam radiation from 06/12/2015 to 08/04/2015. She began adjuvant endocrine  therapy on Arimidex after radiation.  Pt had a follow up CT chest with contrast completed on 06/25/2016 with results revealing: IMPRESSION: 1. Unchanged multiple subcentimeter pulmonary nodules bilaterally. there is no new pulmonary nodules. 2. No thoracic LAD. 3. new right lung post radiation changes. 4. New diffuse right breast skin thicking suggestive of postradiation changes.   Jasmine Carlson presents to the office today as scheduled. She is tolerating Arimidex well. She has some joint aches particularly in the knees and mostly while ambulating for distances and walking upstairs. She currently drives but is without a vehicle at this time. She has forms for [place] to fill out to qualify for a SCAT. She moved here in August from [place] to be near family. Her last breast physical and breast exam was 3 months ago and was normal per patient. She completes self breast exams. Her port has not been flushed since July and is asking for a flush today.  Prior to today's appointment, the patient presented to the ED on 12/06/2016 for dizziness. She experienced 2 falls without syncope. She had labs drawn and an IV inserted the left without being evaluated. She has not had any episodes since that time. She denies drinking well at that time since she was moving, and may have been dehydrated.her creatinine in the ED  was 1.3, we'll recheck that today. She has increased her liquid intake since then. 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 I do not have the report of her evaluation and treatment for that episode.She denies bleeding and has no known history of blood clots. She needs to establish with a PCP. She notes she ran out of losartan a couple days ago.  Pt reports that she was in the ED on 12/06/2016 for dizziness and 2 prior falls without syncope. She denies any recent episodes of dizziness and reports that she may have been dehydrated at the time. She notes that she now consumes more liquids. Per pt chart  review, pt LWBS after triage on 12/06/16 and was not evaluated for her dizziness. She denies taking any new medications recently. Pt reports that she takes Plavix and denies hx of blood clots. Pt reports that she is out of her losartan a couple days ago. She notes that she had a mini-stroke 3-4 years ago and will need to obtain a PCP.   On review of systems, pt denies fever, chills, decreased appetite, decreased energy levels. Denies hot flashes, night sweats, mood swings, or depression. Reports weight gain. Denies bleeding or vaginal bleeding. Denies extremity swelling. Denies breast pain, nipple discharge, or breast dimpling. Reports intermittent left foot tingling. Denies numbness. Denies CP, cough, SOB.  Denies pain. Pt denies abdominal pain, nausea, vomiting, constipation, or diarrhea. Denies HA, vision changes, slurred speech, or weakness.   CURRENT THERAPY:  Anastrozole 37m daily starting 09/12/15   INTERVAL HISTORY  RMelady Chowis here for a follow up. She is doing well overall. She continues on anastrozole with good tolerance. She denies any issues with anastrozole at this time. She notes that she hasn't had a mammogram this year and she was due for it in March of this year.   Since her last visit to the office, she underwent a colonoscopy with results showing: Surgical [P], cecum, polyp with multiple fragments of tubular adenoma (s). No high grade dysplasia or malignancy identified. Surgical [P], ascending, polyp with tubular adenoma (1 of 2 fragments). Benign colonic mucosa (1 of 2 fragments). No high grade dysplasia or malignancy identified.   On review of systems, she denies any other symptoms. Pertinent positives are listed and detailed within the above HPI.     MEDICAL HISTORY:  Past Medical History:  Diagnosis Date  . Arthritis   . Breast cancer ([place]   . Colon polyps   . Diabetes mellitus without complication ([place]   . GI bleed   . Hyperlipidemia   . Hypertension   . Stroke  ([facility]    SURGICAL HISTORY: Past Surgical History:  Procedure Laterality Date  . ABDOMINAL HYSTERECTOMY     30 years ago  . BREAST LUMPECTOMY Right 08/02/2014    SOCIAL HISTORY: Social History   Socioeconomic History  . Marital status: Single    Spouse name: Not on file  . Number of children: 5  . Years of education: Not on file  . Highest education level: Not on file  Occupational History  . Occupation: retired  SScientific laboratory technician . Financial resource strain: Not on file  . Food insecurity:    Worry: Not on file    Inability: Not on file  . Transportation needs:    Medical: Not on file    Non-medical: Not on file  Tobacco Use  . Smoking status: Never Smoker  . Smokeless tobacco: Never Used  Substance  and Sexual Activity  . Alcohol use: No    Comment: Pt quit drinking a long time ago.  . Drug use: No  . Sexual activity: Never  Lifestyle  . Physical activity:    Days per week: Not on file    Minutes per session: Not on file  . Stress: Not on file  Relationships  . Social connections:    Talks on phone: Not on file    Gets together: Not on file    Attends religious service: Not on file    Active member of club or organization: Not on file    Attends meetings of clubs or organizations: Not on file    Relationship status: Not on file  . Intimate partner violence:    Fear of current or ex partner: Not on file    Emotionally abused: Not on file    Physically abused: Not on file    Forced sexual activity: Not on file  Other Topics Concern  . Not on file  Social History Narrative  . Not on file   Denies recent ETOH use, denies smoking cigarettes in the past. She notes that her sister has a hx of breast cancer that was diagnosed post-menopausal. Pt reports that her father has a hx of throat cancer.  Pt states that she has 3 living sons (1 son is currently on disability) and 2 daughters that passed away, one from drug overdose and one AIDS.  FAMILY HISTORY: Family  History  Problem Relation Age of Onset  . Heart attack Mother   . Diabetes Mother   . Hypertension Mother   . Hyperlipidemia Mother   . Throat cancer Father   . Breast cancer Sister        postmenopausal; double mastectomy  . HIV Son   . Throat cancer Brother   . Throat cancer Brother     ALLERGIES:  has No Known Allergies.  MEDICATIONS:  Current Outpatient Medications  Medication Sig Dispense Refill  . anastrozole (ARIMIDEX) 1 MG tablet Take 1 tablet (1 mg total) by mouth daily. 90 tablet 4  . carvedilol (COREG) 6.25 MG tablet Take 1 tablet (6.25 mg total) by mouth 2 (two) times daily with a meal. 180 tablet 3  . clopidogrel (PLAVIX) 75 MG tablet Take 1 tablet (75 mg total) by mouth daily. 90 tablet 3  . losartan (COZAAR) 100 MG tablet Take 1 tablet (100 mg total) by mouth daily. 90 tablet 3  . metFORMIN (GLUCOPHAGE) 500 MG tablet Take 1 tablet (500 mg total) by mouth daily with supper. 90 tablet 3  . Enigma Patient is to check blood sugar 1-2 times daily. 100 each 11  . simvastatin (ZOCOR) 40 MG tablet Take 1 tablet (40 mg total) by mouth every evening. 90 tablet 3  . glucose blood (ONETOUCH VERIO) test strip Patient is to check blood sugar 1-2 times daily. Dx. E11.9 (Patient not taking: Reported on 10/08/2017) 100 each 12  . nystatin (MYCOSTATIN) 100000 UNIT/ML suspension Take 5 mLs (500,000 Units total) by mouth 4 (four) times daily. 60 mL 0  . Nystatin POWD 1 application by Does not apply route 2 (two) times daily. 1 Bottle 0   Current Facility-Administered Medications  Medication Dose Route Frequency Provider Last Rate Last Dose  . 0.9 %  sodium chloride infusion  500 mL Intravenous Once Irene Shipper, MD        REVIEW OF SYSTEMS:  Constitutional: Denies fevers, chills, abnormal night sweats,  decreased appetite, or decreased energy levels. Denies hot flashes, mood swings. (+) weight gain Eyes: Denies blurriness of vision, double vision or watery  eyes Ears, nose, mouth, throat, and face: Denies mucositis or sore throat Respiratory: Denies cough, dyspnea or wheezes. No SOB. Cardiovascular: Denies palpitation, chest discomfort or lower extremity swelling Gastrointestinal:  Denies nausea, heartburn or change in bowel habits Skin: Denies abnormal skin rashes Musculoskeletal: (+) arthritis in right arm, tenderness in right arm and breast Lymphatics: Denies easy bruising (+) mild right arm lymphedema  Neurological:Denies numbness, tingling or new weaknesses Behavioral/Psych: Mood is stable, no new changes  All other systems were reviewed with the patient and are negative.   PHYSICAL EXAMINATION:  ECOG PERFORMANCE STATUS: 0 - Asymptomatic  Vitals:   10/08/17 0922  BP: (!) 181/62  Pulse: 76  Resp: 17  Temp: 98.4 F (36.9 C)  SpO2: 99%   Filed Weights   10/08/17 0922  Weight: 171 lb 12.8 oz (77.9 kg)    GENERAL:alert, no distress and comfortable SKIN: skin color, texture, turgor are normal, no rashes or significant lesions EYES: normal, conjunctiva are pink and non-injected, sclera clear OROPHARYNX:no exudate, no erythema and lips, buccal mucosa, and tongue normal  NECK: supple, thyroid normal size, non-tender, without nodularity LYMPH:  no palpable lymphadenopathy in the cervical, axillary or inguinal (+) mild right arm lymphedema  LUNGS: clear to auscultation and percussion with normal breathing effort HEART: regular rate & rhythm and no murmurs and no lower extremity edema ABDOMEN:abdomen soft, non-tender and normal bowel sounds Musculoskeletal:no cyanosis of digits and no clubbing  PSYCH: alert & oriented x 3 with fluent speech NEURO: no focal motor/sensory deficits BREAST: Breast inspection showed them to be symmetrical with no nipple discharge. Palpation of the breasts and axilla revealed no obvious mass that I could appreciate.  S/p right lumpectomy: Surgical scars well healed, no significant scar tissue.     LABORATORY DATA:  I have reviewed the data as listed CBC Latest Ref Rng & Units 10/08/2017 06/12/2017 05/27/2017  WBC 3.9 - 10.3 K/uL 5.1 11.2(H) 6.3  Hemoglobin 11.6 - 15.9 g/dL 11.3(L) 11.5(L) 9.1(L)  Hematocrit 34.8 - 46.6 % 36.6 36.0 27.9(L)  Platelets 145 - 400 K/uL 237 283.0 206.0   CMP Latest Ref Rng & Units 10/08/2017 01/02/2017 12/06/2016  Glucose 70 - 140 mg/dL 164(H) 118 148(H)  BUN 7 - 26 mg/dL 21 14.2 26(H)  Creatinine 0.60 - 1.10 mg/dL 1.25(H) 1.1 1.30(H)  Sodium 136 - 145 mmol/L 139 138 135  Potassium 3.5 - 5.1 mmol/L 4.6 4.3 4.3  Chloride 98 - 109 mmol/L 105 - 102  CO2 22 - 29 mmol/L _0 Calcium 8.4 - 10.4 mg/dL 9.8 9.8 8.9  Total Protein 6.4 - 8.3 g/dL 7.1 7.4 -  Total Bilirubin 0.2 - 1.2 mg/dL 0.3 0.34 -  Alkaline Phos 40 - 150 U/L 93 82 -  AST 5 - 34 U/L 12 15 -  ALT 0 - 55 U/L 6 11 -   PATHOLOGY:   Diagnosis, 06/25/2017 1. Surgical [P], cecum, polyp - MULTIPLE FRAGMENTS OF TUBULAR ADENOMA(S) - NO HIGH GRADE DYSPLASIA OR MALIGNANCY IDENTIFIED 2. Surgical [P], ascending, polyp - TUBULAR ADENOMA (1 OF 2 FRAGMENTS) - BENIGN COLONIC MUCOSA (1 OF 2 FRAGMENTS) - NO HIGH GRADE DYSPLASIA OR MALIGNANCY IDENTIFIED  US guided needle core biopsy 06/30/2014 Invasive ductal carcinoma, grade 2, ER+ 100%, PR+ 75-80%, and HER2/neu FISH+.   Right breast lumpectomy 08/02/2014 Invasive ductal carcinoma, grade 2 with surgical margins  free of carcinoma. Of the 2 lymphnodes excised, all were negative with superior, inferior, medial, and lateral margins negative. final stage was T1cN0 dx. ER 100%+, PR 75-80%+ and HER2/neu amplified by FISH.  RADIOGRAPHIC STUDIES: I have personally reviewed the radiological images as listed and agreed with the findings in the report. No results found.   Mammogram 05/25/2014 Suspicious lesion at the 3 o'clock position in the right breast.    06/15/14  Suspicious nodule, a spiculated, hypoechoic mass, 1.1 cm at the 1 o'clock position.   DEXA  scan 09/06/2014 IMPRESSION: Increased activity noted in the cervical spine which is most likely secondary to degenerative changes. Followup study is recommended. No abnormal long bone activity. Degenerative changes noted in the knees and feet.   CT chest 09/06/14  IMPRESSION: 1. Multiple pulmonary nodules bilaterally up to 5 mm are nonspecific and metastases cannot be excluded. Recommend a close f/u chest CT in 3 months to assess stability. 2. No thoracic lymphadenopathy. 3. Soft tissue mass in the right breast as described is consistent with the known primary malignancy.   CT CAP 09/13/14  IMPRESSION: Adjacent soft tissue nodules inferior to the right hepatic lobe posteriorly up to 0.9 cm are nonspecific. Continued CT followup is recommended in this patient with breast cancer. 2. There is otherwise no mass or LAD in the abdomen and pelvis. 3. Colonic diverticulosis. 4. status post hysterectomy.   CT chest with contrast 06/25/2016  IMPRESSION: 1. unchanged multiple subcentimeter pulmonary nodules bilaterally. there is no new pulmonary nodules. 2. No thoracic LAD. 3. new right lung post radiation changes. 4. new diffuse right breast skin thicking suggestive of postradiation changes.   ASSESSMENT & PLAN: 77 y.o. african-american female, with a PMHx of breast cancer, DM, and HTN presented with abnormal finding on screening mammogram in 05/25/2014. Pt was treated with radiation therapy 06/12/2015-08/04/2015 (external beam radiation therapy) and chemotherapy of taxol (3 months), herceptin (1 year), and is currently on anastrozole.  1. 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 Unfortunately the clinic notes from her oncologist were not available during her visit with [place], I will request again  -We reviewed her pathology and radiology results in detail. -she had lumpectomy and  sentinel lymph node biopsy with negative margins in 07/2014, adjuvant chemotherapy 09/2014-09/2015, radiation in 2017, and now on adjuvant anastrozole since 09/12/15 -She is tolerating letrozole very well, we'll continue, for total 5-7 years. -We discussed the risk of cancer recurrence. Given her early stage HER-2 positive disease, she has moderate risk for recurrence. -Continue breast cancer surveillance. She is compliant with annual mammogram, self exam, and I encouraged her to follow-up with [place] every 6 months. -I previously encouraged her to have healthy diet and exercise regularly -Labs today, 10/08/2017 show CBC with Hgb at 11.3, MCHC at 30.9, and RDW at 15.2. CMP with Cr at 1.25 and elevated glucose at 164. .  -Labs reviewed with patient today.  -she is overdue for annual mammogram, will schedule for her today  -She is clinically doing well. Her physical exam was unremarkable.  She is concerned about a possible lump in the right breast, my exam was negative today. There is no clinical concern for recurrence.  -Continue Anastrozole  -Continue Breast Surveillance -Schedule her mammogram and D[facility]A at [facility] ASAP, she is overdue -Lab, and f/u in 6 moths  -IR port removal     2. Genetics -due to  family history I have previously recommended that she be seen by our genetic counselor for counseling and for possible genetic testing to ruled out inheritable cancer syndrome.  3. Bone Health -Her last bone density scan was normal per patient -We previously discussed the potential side effects of osteopenia and osteoporosis from letrozole -I previously encouraged her to continue calcium and vitamin D supplement -She was due for her bone density scan on 06/2017, I will order this today.   4. HTN, dizziness, DM -She had a few episodes of dizziness a few months ago, possible related to her dehydration -She notes that her blood sugar levels have been good and she intermittently checks her blood sugar levels.  She continues on Metformin.  -I previously encouraged her to find a primary care physician and f/u   Plan: -Continue anastrozole -Schedule her mammogram and DEXA at [facility] ASAP, she is overdue (per [facility], they need her prior mammogram from [place] before they can schedule, request made again)  -Lab, and f/u in 6 moths  -IR port removal      No orders of the defined types were placed in this encounter.   All questions were answered. The patient knows to call the clinic with any problems, questions or concerns. I spent 20 minutes counseling the patient face to face. The total time spent in the appointment was 25 minutes and more than 50% was on counseling.     Truitt Merle, MD 10/08/2017   I, Soijett Blue am acting as scribe for Dr. Truitt Merle.  I have reviewed the above documentation for accuracy and completeness, and I agree with the above.

## 2017-10-08 ENCOUNTER — Telehealth: Payer: Self-pay | Admitting: Hematology

## 2017-10-08 ENCOUNTER — Inpatient Hospital Stay: Payer: Medicare HMO

## 2017-10-08 ENCOUNTER — Encounter: Payer: Self-pay | Admitting: Hematology

## 2017-10-08 ENCOUNTER — Inpatient Hospital Stay: Payer: Medicare HMO | Attending: Hematology | Admitting: Hematology

## 2017-10-08 VITALS — BP 181/62 | HR 76 | Temp 98.4°F | Resp 17 | Ht 62.0 in | Wt 171.8 lb

## 2017-10-08 DIAGNOSIS — E785 Hyperlipidemia, unspecified: Secondary | ICD-10-CM | POA: Insufficient documentation

## 2017-10-08 DIAGNOSIS — Z452 Encounter for adjustment and management of vascular access device: Secondary | ICD-10-CM | POA: Insufficient documentation

## 2017-10-08 DIAGNOSIS — I10 Essential (primary) hypertension: Secondary | ICD-10-CM | POA: Diagnosis not present

## 2017-10-08 DIAGNOSIS — R918 Other nonspecific abnormal finding of lung field: Secondary | ICD-10-CM | POA: Diagnosis not present

## 2017-10-08 DIAGNOSIS — C50211 Malignant neoplasm of upper-inner quadrant of right female breast: Secondary | ICD-10-CM | POA: Diagnosis not present

## 2017-10-08 DIAGNOSIS — Z90710 Acquired absence of both cervix and uterus: Secondary | ICD-10-CM | POA: Insufficient documentation

## 2017-10-08 DIAGNOSIS — Z923 Personal history of irradiation: Secondary | ICD-10-CM

## 2017-10-08 DIAGNOSIS — Z8673 Personal history of transient ischemic attack (TIA), and cerebral infarction without residual deficits: Secondary | ICD-10-CM | POA: Insufficient documentation

## 2017-10-08 DIAGNOSIS — Z79811 Long term (current) use of aromatase inhibitors: Secondary | ICD-10-CM | POA: Diagnosis not present

## 2017-10-08 DIAGNOSIS — E119 Type 2 diabetes mellitus without complications: Secondary | ICD-10-CM | POA: Diagnosis not present

## 2017-10-08 DIAGNOSIS — Z9221 Personal history of antineoplastic chemotherapy: Secondary | ICD-10-CM | POA: Insufficient documentation

## 2017-10-08 DIAGNOSIS — Z7984 Long term (current) use of oral hypoglycemic drugs: Secondary | ICD-10-CM | POA: Insufficient documentation

## 2017-10-08 DIAGNOSIS — Z170 Estrogen receptor positive status [ER+]: Secondary | ICD-10-CM | POA: Diagnosis not present

## 2017-10-08 DIAGNOSIS — Z803 Family history of malignant neoplasm of breast: Secondary | ICD-10-CM | POA: Diagnosis not present

## 2017-10-08 DIAGNOSIS — Z7902 Long term (current) use of antithrombotics/antiplatelets: Secondary | ICD-10-CM

## 2017-10-08 DIAGNOSIS — Z79899 Other long term (current) drug therapy: Secondary | ICD-10-CM | POA: Diagnosis not present

## 2017-10-08 DIAGNOSIS — C50911 Malignant neoplasm of unspecified site of right female breast: Secondary | ICD-10-CM

## 2017-10-08 LAB — CBC WITH DIFFERENTIAL/PLATELET
Basophils Absolute: 0 10*3/uL (ref 0.0–0.1)
Basophils Relative: 1 %
EOS ABS: 0.2 10*3/uL (ref 0.0–0.5)
Eosinophils Relative: 3 %
HEMATOCRIT: 36.6 % (ref 34.8–46.6)
HEMOGLOBIN: 11.3 g/dL — AB (ref 11.6–15.9)
LYMPHS ABS: 1.5 10*3/uL (ref 0.9–3.3)
LYMPHS PCT: 29 %
MCH: 25.7 pg (ref 25.1–34.0)
MCHC: 30.9 g/dL — ABNORMAL LOW (ref 31.5–36.0)
MCV: 83.2 fL (ref 79.5–101.0)
Monocytes Absolute: 0.4 10*3/uL (ref 0.1–0.9)
Monocytes Relative: 8 %
NEUTROS ABS: 3.1 10*3/uL (ref 1.5–6.5)
NEUTROS PCT: 59 %
Platelets: 237 10*3/uL (ref 145–400)
RBC: 4.4 MIL/uL (ref 3.70–5.45)
RDW: 15.2 % — ABNORMAL HIGH (ref 11.2–14.5)
WBC: 5.1 10*3/uL (ref 3.9–10.3)

## 2017-10-08 LAB — COMPREHENSIVE METABOLIC PANEL
ALT: 6 U/L (ref 0–55)
AST: 12 U/L (ref 5–34)
Albumin: 3.6 g/dL (ref 3.5–5.0)
Alkaline Phosphatase: 93 U/L (ref 40–150)
Anion gap: 9 (ref 3–11)
BUN: 21 mg/dL (ref 7–26)
CO2: 25 mmol/L (ref 22–29)
Calcium: 9.8 mg/dL (ref 8.4–10.4)
Chloride: 105 mmol/L (ref 98–109)
Creatinine, Ser: 1.25 mg/dL — ABNORMAL HIGH (ref 0.60–1.10)
GFR calc Af Amer: 46 mL/min — ABNORMAL LOW (ref >60)
GFR calc non Af Amer: 40 mL/min — ABNORMAL LOW (ref >60)
Glucose, Bld: 164 mg/dL — ABNORMAL HIGH (ref 70–140)
Potassium: 4.6 mmol/L (ref 3.5–5.1)
Sodium: 139 mmol/L (ref 136–145)
Total Bilirubin: 0.3 mg/dL (ref 0.2–1.2)
Total Protein: 7.1 g/dL (ref 6.4–8.3)

## 2017-10-08 MED ORDER — HEPARIN SOD (PORK) LOCK FLUSH 100 UNIT/ML IV SOLN
500.0000 [IU] | Freq: Once | INTRAVENOUS | Status: AC
  Administered 2017-10-08: 500 [IU]
  Filled 2017-10-08: qty 5

## 2017-10-08 MED ORDER — NYSTATIN 100000 UNIT/ML MT SUSP: 5.0000 mL | Freq: Four times a day (QID) | OROMUCOSAL | 0 refills | Status: DC

## 2017-10-08 MED ORDER — SODIUM CHLORIDE 0.9% FLUSH
10.0000 mL | Freq: Once | INTRAVENOUS | Status: AC
  Administered 2017-10-08: 10 mL
  Filled 2017-10-08: qty 10

## 2017-10-08 MED ORDER — NYSTATIN POWD: 1.0000 "application " | Freq: Two times a day (BID) | 0 refills | Status: DC

## 2017-10-08 NOTE — Telephone Encounter (Signed)
Appointments scheduled/ Printed new Record release for patient to have images sent to [facility] for Mammogram/ Density testing to be scheduled..  IB message to Dr Burr Medico as well per 6/12 los

## 2017-10-10 ENCOUNTER — Telehealth: Payer: Self-pay | Admitting: Family Medicine

## 2017-10-10 NOTE — Telephone Encounter (Signed)
Copied from [place] [id number]. Topic: Inquiry >> Oct 10, 2017 10:04 AM Mylinda Latina, NT wrote: Reason for CRM: Patient states she needs an updated med list sent to her pharmacy [facility] [place], [place] to [facility] [phone] (Phone) [phone] (Fax) . She switched back her old pharmacy and they need this information before they can accept her.  Please advise.

## 2017-10-13 NOTE — Telephone Encounter (Signed)
Current med list has been faxed to [facility] per pt request.

## 2017-10-15 DIAGNOSIS — M1711 Unilateral primary osteoarthritis, right knee: Secondary | ICD-10-CM | POA: Diagnosis not present

## 2017-10-15 DIAGNOSIS — M25561 Pain in right knee: Secondary | ICD-10-CM | POA: Diagnosis not present

## 2017-10-27 DIAGNOSIS — M25561 Pain in right knee: Secondary | ICD-10-CM | POA: Diagnosis not present

## 2017-10-27 DIAGNOSIS — M1711 Unilateral primary osteoarthritis, right knee: Secondary | ICD-10-CM | POA: Diagnosis not present

## 2017-11-03 ENCOUNTER — Other Ambulatory Visit: Payer: Self-pay | Admitting: Radiology

## 2017-11-04 ENCOUNTER — Ambulatory Visit (HOSPITAL_COMMUNITY)
Admission: RE | Admit: 2017-11-04 | Discharge: 2017-11-04 | Disposition: A | Discharge status: Home / Self Care | Payer: Medicare HMO | Source: Ambulatory Visit | Attending: Hematology | Admitting: Hematology

## 2017-11-04 ENCOUNTER — Encounter (HOSPITAL_COMMUNITY): Payer: Self-pay

## 2017-11-04 DIAGNOSIS — M1990 Unspecified osteoarthritis, unspecified site: Secondary | ICD-10-CM | POA: Diagnosis not present

## 2017-11-04 DIAGNOSIS — Z8249 Family history of ischemic heart disease and other diseases of the circulatory system: Secondary | ICD-10-CM | POA: Insufficient documentation

## 2017-11-04 DIAGNOSIS — Z853 Personal history of malignant neoplasm of breast: Secondary | ICD-10-CM | POA: Diagnosis not present

## 2017-11-04 DIAGNOSIS — Z7984 Long term (current) use of oral hypoglycemic drugs: Secondary | ICD-10-CM | POA: Diagnosis not present

## 2017-11-04 DIAGNOSIS — I10 Essential (primary) hypertension: Secondary | ICD-10-CM | POA: Insufficient documentation

## 2017-11-04 DIAGNOSIS — Z9221 Personal history of antineoplastic chemotherapy: Secondary | ICD-10-CM | POA: Diagnosis not present

## 2017-11-04 DIAGNOSIS — E119 Type 2 diabetes mellitus without complications: Secondary | ICD-10-CM | POA: Diagnosis not present

## 2017-11-04 DIAGNOSIS — Z5111 Encounter for antineoplastic chemotherapy: Secondary | ICD-10-CM | POA: Diagnosis not present

## 2017-11-04 DIAGNOSIS — Z170 Estrogen receptor positive status [ER+]: Secondary | ICD-10-CM

## 2017-11-04 DIAGNOSIS — E785 Hyperlipidemia, unspecified: Secondary | ICD-10-CM | POA: Diagnosis not present

## 2017-11-04 DIAGNOSIS — Z452 Encounter for adjustment and management of vascular access device: Secondary | ICD-10-CM | POA: Diagnosis not present

## 2017-11-04 DIAGNOSIS — C50211 Malignant neoplasm of upper-inner quadrant of right female breast: Secondary | ICD-10-CM

## 2017-11-04 DIAGNOSIS — Z8673 Personal history of transient ischemic attack (TIA), and cerebral infarction without residual deficits: Secondary | ICD-10-CM | POA: Insufficient documentation

## 2017-11-04 HISTORY — PX: IR REMOVAL TUN ACCESS W/ PORT W/O FL MOD SED: IMG2290

## 2017-11-04 LAB — CBC
HEMATOCRIT: 38.9 % (ref 36.0–46.0)
HEMOGLOBIN: 11.9 g/dL — AB (ref 12.0–15.0)
MCH: 25.6 pg — ABNORMAL LOW (ref 26.0–34.0)
MCHC: 30.6 g/dL (ref 30.0–36.0)
MCV: 83.7 fL (ref 78.0–100.0)
Platelets: 241 10*3/uL (ref 150–400)
RBC: 4.65 MIL/uL (ref 3.87–5.11)
RDW: 16.3 % — AB (ref 11.5–15.5)
WBC: 5 10*3/uL (ref 4.0–10.5)

## 2017-11-04 LAB — GLUCOSE, CAPILLARY: GLUCOSE-CAPILLARY: 154 mg/dL — AB (ref 70–99)

## 2017-11-04 LAB — APTT: APTT: 24 s (ref 24–36)

## 2017-11-04 LAB — PROTIME-INR
INR: 1.15
Prothrombin Time: 14.6 seconds (ref 11.4–15.2)

## 2017-11-04 MED ORDER — FENTANYL CITRATE (PF) 100 MCG/2ML IJ SOLN
INTRAMUSCULAR | Status: AC
  Filled 2017-11-04: qty 2

## 2017-11-04 MED ORDER — LIDOCAINE-EPINEPHRINE (PF) 2 %-1:200000 IJ SOLN
INTRAMUSCULAR | Status: AC | PRN
  Administered 2017-11-04: 20 mL

## 2017-11-04 MED ORDER — MIDAZOLAM HCL 2 MG/2ML IJ SOLN
INTRAMUSCULAR | Status: AC | PRN
  Administered 2017-11-04: 2 mg via INTRAVENOUS

## 2017-11-04 MED ORDER — CEFAZOLIN SODIUM-DEXTROSE 2-4 GM/100ML-% IV SOLN
2.0000 g | Freq: Once | INTRAVENOUS | Status: AC
  Administered 2017-11-04: 2 g via INTRAVENOUS

## 2017-11-04 MED ORDER — CEFAZOLIN SODIUM-DEXTROSE 2-4 GM/100ML-% IV SOLN
INTRAVENOUS | Status: AC
  Filled 2017-11-04: qty 100

## 2017-11-04 MED ORDER — LIDOCAINE-EPINEPHRINE (PF) 2 %-1:200000 IJ SOLN
INTRAMUSCULAR | Status: AC
  Filled 2017-11-04: qty 20

## 2017-11-04 MED ORDER — SODIUM CHLORIDE 0.9 % IV SOLN
INTRAVENOUS | Status: DC
  Administered 2017-11-04: 12:00:00 via INTRAVENOUS

## 2017-11-04 MED ORDER — MIDAZOLAM HCL 2 MG/2ML IJ SOLN
INTRAMUSCULAR | Status: AC
  Filled 2017-11-04: qty 4

## 2017-11-04 MED ORDER — FENTANYL CITRATE (PF) 100 MCG/2ML IJ SOLN
INTRAMUSCULAR | Status: AC | PRN
  Administered 2017-11-04 (×2): 50 ug via INTRAVENOUS

## 2017-11-04 NOTE — Procedures (Signed)
Pre Procedural Dx: Poor venous access Post Procedural Dx: Same  Successful removal of anterior chest wall port-a-cath.  EBL: Minimal  No immediate post procedural complications.   Jay Dannie Woolen, MD Pager #: [phone]   

## 2017-11-04 NOTE — Discharge Instructions (Signed)
Moderate Conscious Sedation, Adult, Care After °These instructions provide you with information about caring for yourself after your procedure. Your health care provider may also give you more specific instructions. Your treatment has been planned according to current medical practices, but problems sometimes occur. Call your health care provider if you have any problems or questions after your procedure. °What can I expect after the procedure? °After your procedure, it is common: °· To feel sleepy for several hours. °· To feel clumsy and have poor balance for several hours. °· To have poor judgment for several hours. °· To vomit if you eat too soon. ° °Follow these instructions at home: °For at least 24 hours after the procedure: ° °· Do not: °? Participate in activities where you could fall or become injured. °? Drive. °? Use heavy machinery. °? Drink alcohol. °? Take sleeping pills or medicines that cause drowsiness. °? Make important decisions or sign legal documents. °? Take care of children on your own. °· Rest. °Eating and drinking °· Follow the diet recommended by your health care provider. °· If you vomit: °? Drink water, juice, or soup when you can drink without vomiting. °? Make sure you have little or no nausea before eating solid foods. °General instructions °· Have a responsible adult stay with you until you are awake and alert. °· Take over-the-counter and prescription medicines only as told by your health care provider. °· If you smoke, do not smoke without supervision. °· Keep all follow-up visits as told by your health care provider. This is important. °Contact a health care provider if: °· You keep feeling nauseous or you keep vomiting. °· You feel light-headed. °· You develop a rash. °· You have a fever. °Get help right away if: °· You have trouble breathing. °This information is not intended to replace advice given to you by your health care provider. Make sure you discuss any questions you have  with your health care provider. °Document Released: 02/03/2013 Document Revised: 09/18/2015 Document Reviewed: 08/05/2015 °Elsevier Interactive Patient Education © 2018 [employer] ° ° °Implanted Port Removal, Care After °Refer to this sheet in the next few weeks. These instructions provide you with information about caring for yourself after your procedure. Your health care provider may also give you more specific instructions. Your treatment has been planned according to current medical practices, but problems sometimes occur. Call your health care provider if you have any problems or questions after your procedure. °What can I expect after the procedure? °After the procedure, it is common to have: °· Soreness or pain near your incision. °· Some swelling or bruising near your incision. ° °Follow these instructions at home: °Medicines °· Take over-the-counter and prescription medicines only as told by your health care provider. °· If you were prescribed an antibiotic medicine, take it as told by your health care provider. Do not stop taking the antibiotic even if you start to feel better. °Bathing °· Do not take baths, swim, or use a hot tub until your health care provider approves. Ask your health care provider if you can take showers. You may only be allowed to take sponge baths for bathing.  You may shower tomorrow. °Incision care °· Follow instructions from your health care provider about how to take care of your incision. Make sure you: °? Wash your hands with soap and water before you change your bandage (dressing). If soap and water are not available, use hand sanitizer. °? Change your dressing as told by your   health care provider.  You may remove your dressing tomorrow. °? Keep your dressing dry. °? Leave stitches (sutures), skin glue, or adhesive strips in place. These skin closures may need to stay in place for 2 weeks or longer. If adhesive strip edges start to loosen and curl up, you may trim the  loose edges. Do not remove adhesive strips completely unless your health care provider tells you to do that. °· Check your incision area every day for signs of infection. Check for: °? More redness, swelling, or pain. °? More fluid or blood. °? Warmth. °? Pus or a bad smell. °Driving °· If you received a sedative, do not drive for 24 hours after the procedure. °· If you did not receive a sedative, ask your health care provider when it is safe to drive. °Activity °· Return to your normal activities as told by your health care provider. Ask your health care provider what activities are safe for you. °· Until your health care provider says it is safe: °? Do not lift anything that is heavier than 10 lb (4.5 kg). °? Do not do activities that involve lifting your arms over your head. °General instructions °· Do not use any tobacco products, such as cigarettes, chewing tobacco, and e-cigarettes. Tobacco can delay healing. If you need help quitting, ask your health care provider. °· Keep all follow-up visits as told by your health care provider. This is important. °Contact a health care provider if: °· You have more redness, swelling, or pain around your incision. °· You have more fluid or blood coming from your incision. °· Your incision feels warm to the touch. °· You have pus or a bad smell coming from your incision. °· You have a fever. °· You have pain that is not relieved by your pain medicine. °Get help right away if: °· You have chest pain. °· You have difficulty breathing. °This information is not intended to replace advice given to you by your health care provider. Make sure you discuss any questions you have with your health care provider. °Document Released: 03/27/2015 Document Revised: 09/21/2015 Document Reviewed: 01/18/2015 °Elsevier Interactive Patient Education © 2018 [employer] ° °

## 2017-11-04 NOTE — Consult Note (Signed)
Chief Complaint: Patient was seen in consultation today for Port-A-Cath removal  Referring Physician(s): Feng,Yan  Supervising Physician: Sandi Mariscal  Patient Status: [facility] - Out-pt  History of Present Illness: Jasmine Carlson is a 77 y.o. female with history of right breast cancer, status post surgery and completion of chemoradiation, who presents today for Port-A-Cath removal (placed in [place]).  Past Medical History:  Diagnosis Date  . Arthritis   . Breast cancer ([place])   . Colon polyps   . Diabetes mellitus without complication ([place])   . GI bleed   . Hyperlipidemia   . Hypertension   . Stroke [facility])     Past Surgical History:  Procedure Laterality Date  . ABDOMINAL HYSTERECTOMY     30 years ago  . BREAST LUMPECTOMY Right 08/02/2014    Allergies: Patient has no known allergies.  Medications: Prior to Admission medications   Medication Sig Start Date End Date Taking? Authorizing Provider  anastrozole (ARIMIDEX) 1 MG tablet Take 1 tablet (1 mg total) by mouth daily. 05/06/17  Yes Truitt Merle, MD  carvedilol (COREG) 6.25 MG tablet Take 1 tablet (6.25 mg total) by mouth 2 (two) times daily with a meal. 07/09/17  Yes [name], MD  losartan (COZAAR) 100 MG tablet Take 1 tablet (100 mg total) by mouth daily. 07/09/17  Yes [name], MD  metFORMIN (GLUCOPHAGE) 500 MG tablet Take 1 tablet (500 mg total) by mouth daily with supper. 07/09/17  Yes [name], MD  nystatin (MYCOSTATIN) 100000 UNIT/ML suspension Take 5 mLs (500,000 Units total) by mouth 4 (four) times daily. 10/08/17  Yes Truitt Merle, MD  Nystatin POWD 1 application by Does not apply route 2 (two) times daily. 10/08/17  Yes Truitt Merle, MD  [place] Patient is to check blood sugar 1-2 times daily. 06/10/17  Yes [name], MD  simvastatin (ZOCOR) 40 MG tablet Take 1 tablet (40 mg total) by mouth every evening. 07/09/17  Yes [name], MD  clopidogrel (PLAVIX) 75 MG tablet  Take 1 tablet (75 mg total) by mouth daily. 07/09/17   [name], MD  glucose blood (ONETOUCH VERIO) test strip Patient is to check blood sugar 1-2 times daily. Dx. E11.9 Patient not taking: Reported on 10/08/2017 06/10/17   [name], MD     Family History  Problem Relation Age of Onset  . Heart attack Mother   . Diabetes Mother   . Hypertension Mother   . Hyperlipidemia Mother   . Throat cancer Father   . Breast cancer Sister        postmenopausal; double mastectomy  . HIV Son   . Throat cancer Brother   . Throat cancer Brother     Social History   Socioeconomic History  . Marital status: Single    Spouse name: Not on file  . Number of children: 5  . Years of education: Not on file  . Highest education level: Not on file  Occupational History  . Occupation: retired  Scientific laboratory technician  . Financial resource strain: Not on file  . Food insecurity:    Worry: Not on file    Inability: Not on file  . Transportation needs:    Medical: Not on file    Non-medical: Not on file  Tobacco Use  . Smoking status: Never Smoker  . Smokeless tobacco: Never Used  Substance and Sexual Activity  . Alcohol use: No    Comment: Pt quit  drinking a long time ago.  . Drug use: No  . Sexual activity: Never  Lifestyle  . Physical activity:    Days per week: Not on file    Minutes per session: Not on file  . Stress: Not on file  Relationships  . Social connections:    Talks on phone: Not on file    Gets together: Not on file    Attends religious service: Not on file    Active member of club or organization: Not on file    Attends meetings of clubs or organizations: Not on file    Relationship status: Not on file  Other Topics Concern  . Not on file  Social History Narrative  . Not on file      Review of Systems denies fever, headache, chest pain, dyspnea, cough, abdominal/back pain, nausea, vomiting or bleeding.  Vital Signs: Blood pressure 159/73, heart rate 73, temp  98.3, respirations 16, O2 sat 100% room air   Physical Exam awake, alert.  Chest clear to auscultation bilaterally.  Clean, intact left chest wall Port-A-Cath.  Heart with regular rate and rhythm.  Abdomen soft, positive bowel sounds, nontender.  No lower extremity edema.  Imaging: No results found.  Labs:  CBC: Recent Labs    05/27/17 1358 06/12/17 1435 10/08/17 0845 11/04/17 1152  WBC 6.3 11.2* 5.1 5.0  HGB 9.1* 11.5* 11.3* 11.9*  HCT 27.9* 36.0 36.6 38.9  PLT 206.0 283.0 237 241    COAGS: Recent Labs    11/04/17 1152  INR 1.15  APTT 24    BMP: Recent Labs    12/06/16 2222 01/02/17 1318 10/08/17 0845  NA 135 138 139  K 4.3 4.3 4.6  CL 102  --  105  CO2 23 24 25   GLUCOSE 148* 118 164*  BUN 26* 14.2 21  CALCIUM 8.9 9.8 9.8  CREATININE 1.30* 1.1 1.25*  GFRNONAA 39*  --  40*  GFRAA 45*  --  46*    LIVER FUNCTION TESTS: Recent Labs    01/02/17 1318 10/08/17 0845  BILITOT 0.34 0.3  AST 15 12  ALT 11 6  ALKPHOS 82 93  PROT 7.4 7.1  ALBUMIN 3.5 3.6    TUMOR MARKERS: No results for input(s): AFPTM, CEA, CA199, CHROMGRNA in the last 8760 hours.  Assessment and Plan: 77 y.o. female with history of right breast cancer, status post surgery and completion of chemoradiation, who presents today for Port-A-Cath removal.  Details/risks of procedure, including but not limited to, internal bleeding, infection, injury to adjacent structures discussed with patient with her understanding and consent   Thank you for this interesting consult.  I greatly enjoyed meeting Jasmine Carlson and look forward to participating in their care.  A copy of this report was sent to the requesting provider on this date.  Electronically Signed: D. Rowe Robert, PA-C 11/04/2017, 1:00 PM   I spent a total of 20 minutes    in face to face in clinical consultation, greater than 50% of which was counseling/coordinating care for Port-A-Cath removal

## 2017-11-05 ENCOUNTER — Telehealth: Payer: Self-pay | Admitting: *Deleted

## 2017-11-05 NOTE — Telephone Encounter (Addendum)
Called Dr Steve Rattler office in [place] @ [phone] & requested last mammogram report to send to [facility] so mammogram can be scheduled.   Per Dr Burr Medico, will need CD or films to be sent to [facility].  Called pt @ [phone] & left message for pt to return call to make sure she wants to do her mammogram at [facility] then we can request films/CD to sent to correct place & she can go ahead & schedule her mammogram where she would like.

## 2017-11-06 ENCOUNTER — Telehealth: Payer: Self-pay

## 2017-11-06 NOTE — Telephone Encounter (Signed)
Patient calls back and states that she would like to have the mammogram at the [facility]. Instructed her to call (number was provided) and make appointment. She states she will.  Last mammogram report has already been requested from MD in [place].

## 2017-11-19 ENCOUNTER — Telehealth: Payer: Self-pay

## 2017-11-19 NOTE — Telephone Encounter (Signed)
Faxed a request to Dr. Geralyn Corwin office in [place] for copy of mammogram report and films or CD be sent to the [facility], so this patient can be set up for f/u studies. [facility].   Fax [phone]

## 2017-12-02 ENCOUNTER — Other Ambulatory Visit: Payer: Self-pay | Admitting: Nurse Practitioner

## 2017-12-02 DIAGNOSIS — C50911 Malignant neoplasm of unspecified site of right female breast: Secondary | ICD-10-CM

## 2017-12-02 DIAGNOSIS — Z170 Estrogen receptor positive status [ER+]: Principal | ICD-10-CM

## 2017-12-23 ENCOUNTER — Ambulatory Visit
Admission: RE | Admit: 2017-12-23 | Discharge: 2017-12-23 | Disposition: A | Discharge status: Home / Self Care | Payer: Medicare HMO | Source: Ambulatory Visit | Attending: Nurse Practitioner | Admitting: Nurse Practitioner

## 2017-12-23 DIAGNOSIS — R928 Other abnormal and inconclusive findings on diagnostic imaging of breast: Secondary | ICD-10-CM | POA: Diagnosis not present

## 2017-12-23 DIAGNOSIS — Z853 Personal history of malignant neoplasm of breast: Secondary | ICD-10-CM | POA: Diagnosis not present

## 2017-12-23 DIAGNOSIS — C50911 Malignant neoplasm of unspecified site of right female breast: Secondary | ICD-10-CM

## 2017-12-23 DIAGNOSIS — Z170 Estrogen receptor positive status [ER+]: Principal | ICD-10-CM

## 2017-12-23 HISTORY — DX: Personal history of irradiation: Z92.3

## 2017-12-23 HISTORY — DX: Personal history of antineoplastic chemotherapy: Z92.21

## 2018-01-07 ENCOUNTER — Encounter: Payer: Self-pay | Admitting: Family Medicine

## 2018-01-07 ENCOUNTER — Ambulatory Visit (INDEPENDENT_AMBULATORY_CARE_PROVIDER_SITE_OTHER): Payer: Medicare HMO | Admitting: Family Medicine

## 2018-01-07 VITALS — BP 146/82 | HR 76 | Temp 98.0°F | Wt 172.0 lb

## 2018-01-07 DIAGNOSIS — E1140 Type 2 diabetes mellitus with diabetic neuropathy, unspecified: Secondary | ICD-10-CM

## 2018-01-07 DIAGNOSIS — M25561 Pain in right knee: Secondary | ICD-10-CM

## 2018-01-07 DIAGNOSIS — M25562 Pain in left knee: Secondary | ICD-10-CM

## 2018-01-07 DIAGNOSIS — G8929 Other chronic pain: Secondary | ICD-10-CM | POA: Diagnosis not present

## 2018-01-07 MED ORDER — GABAPENTIN 100 MG PO CAPS: 100.0000 mg | ORAL_CAPSULE | Freq: Three times a day (TID) | ORAL | 3 refills | Status: DC

## 2018-01-07 NOTE — Patient Instructions (Addendum)
Diabetic Neuropathy Diabetic neuropathy is a nerve disease or nerve damage that is caused by diabetes mellitus. About half of all people with diabetes mellitus have some form of nerve damage. Nerve damage is more common in those who have had diabetes mellitus for many years and who generally have not had good control of their blood sugar (glucose) level. Diabetic neuropathy is a common complication of diabetes mellitus. There are three common types of diabetic neuropathy and a fourth type that is less common and less understood:  Peripheral neuropathy-This is the most common type of diabetic neuropathy. It causes damage to the nerves of the feet and legs first and then eventually the hands and arms. The damage affects the ability to sense touch.  Autonomic neuropathy-This type causes damage to the autonomic nervous system, which controls the following functions: ? Heartbeat. ? Body temperature. ? Blood pressure. ? Urination. ? Digestion. ? Sweating. ? Sexual function.  Focal neuropathy-Focal neuropathy can be painful and unpredictable and occurs most often in older adults with diabetes mellitus. It involves a specific nerve or one area and often comes on suddenly. It usually does not cause long-term problems.  Radiculoplexus neuropathy- Sometimes called lumbosacral radiculoplexus neuropathy, radiculoplexus neuropathy affects the nerves of the thighs, hips, buttocks, or legs. It is more common in people with type 2 diabetes mellitus and in older men. It is characterized by debilitating pain, weakness, and atrophy, usually in the thigh muscles.  What are the causes? The cause of peripheral, autonomic, and focal neuropathies is diabetes mellitus that is uncontrolled and high glucose levels. The cause of radiculoplexus neuropathy is unknown. However, it is thought to be caused by inflammation related to uncontrolled glucose levels. What are the signs or symptoms? Peripheral  Neuropathy Peripheral neuropathy develops slowly over time. When the nerves of the feet and legs no longer work there may be:  Burning, stabbing, or aching pain in the legs or feet.  Inability to feel pressure or pain in your feet. This can lead to: ? Thick calluses over pressure areas. ? Pressure sores. ? Ulcers.  Foot deformities.  Reduced ability to feel temperature changes.  Muscle weakness.  Autonomic Neuropathy The symptoms of autonomic neuropathy vary depending on which nerves are affected. Symptoms may include:  Problems with digestion, such as: ? Feeling sick to your stomach (nausea). ? Vomiting. ? Bloating. ? Constipation. ? Diarrhea. ? Abdominal pain.  Difficulty with urination. This occurs if you lose your ability to sense when your bladder is full. Problems include: ? Urine leakage (incontinence). ? Inability to empty your bladder completely (retention).  Rapid or irregular heartbeat (palpitations).  Blood pressure drops when you stand up (orthostatic hypotension). When you stand up you may feel: ? Dizzy. ? Weak. ? Faint.  In men, inability to attain and maintain an erection.  In women, vaginal dryness and problems with decreased sexual desire and arousal.  Problems with body temperature regulation.  Increased or decreased sweating.  Focal Neuropathy  Abnormal eye movements or abnormal alignment of both eyes.  Weakness in the wrist.  Foot drop. This results in an inability to lift the foot properly and abnormal walking or foot movement.  Paralysis on one side of your face (Bell palsy).  Chest or abdominal pain. Radiculoplexus Neuropathy  Sudden, severe pain in your hip, thigh, or buttocks.  Weakness and wasting of thigh muscles.  Difficulty rising from a seated position.  Abdominal swelling.  Unexplained weight loss (usually more than 10 lb [4.5 kg]). How  is this diagnosed? Peripheral Neuropathy Your senses may be tested. Sensory  function testing can be done with:  A light touch using a monofilament.  A vibration with tuning fork.  A sharp sensation with a pin prick.  Other tests that can help diagnose neuropathy are:  Nerve conduction velocity. This test checks the transmission of an electrical current through a nerve.  Electromyography. This shows how muscles respond to electrical signals transmitted by nearby nerves.  Quantitative sensory testing. This is used to assess how your nerves respond to vibrations and changes in temperature.  Autonomic Neuropathy Diagnosis is often based on reported symptoms. Tell your health care provider if you experience:  Dizziness.  Constipation.  Diarrhea.  Inappropriate urination or inability to urinate.  Inability to get or maintain an erection.  Tests that may be done include:  Electrocardiography or Holter monitor. These are tests that can help show problems with the heart rate or heart rhythm.  An X-ray exam may be done.  Focal Neuropathy Diagnosis is made based on your symptoms and what your health care provider finds during your exam. Other tests may be done. They may include:  Nerve conduction velocities. This checks the transmission of electrical current through a nerve.  Electromyography. This shows how muscles respond to electrical signals transmitted by nearby nerves.  Quantitative sensory testing. This test is used to assess how your nerves respond to vibration and changes in temperature.  Radiculoplexus Neuropathy  Often the first thing is to eliminate any other issue or problems that might be the cause, as there is no standard test for diagnosis.  X-ray exam of your spine and lumbar region.  Spinal tap to rule out cancer.  MRI to rule out other lesions. How is this treated? Once nerve damage occurs, it cannot be reversed. The goal of treatment is to keep the disease or nerve damage from getting worse and affecting more nerve fibers.  Controlling your blood glucose level is the key. Most people with radiculoplexus neuropathy see at least a partial improvement over time. You will need to keep your blood glucose and HbA1c levels in the target range determined by your health care provider. Things that help control blood glucose levels include:  Blood glucose monitoring.  Meal planning.  Physical activity.  Diabetes medicine.  Over time, maintaining lower blood glucose levels helps lessen symptoms. Sometimes, prescription pain medicine is needed. Follow these instructions at home:  Do not smoke.  Keep your blood glucose level in the range that you and your health care provider have determined acceptable for you.  Keep your blood pressure level in the range that you and your health care provider have determined acceptable for you.  Eat a well-balanced diet.  Be physically active every day. Include strength training and balance exercises.  Protect your feet. ? Check your feet every day for sores, cuts, blisters, or signs of infection. ? Wear padded socks and supportive shoes. Use orthotic inserts, if necessary. ? Regularly check the insides of your shoes for worn spots. Make sure there are no rocks or other items inside your shoes before you put them on. Contact a health care provider if:  You have burning, stabbing, or aching pain in the legs or feet.  You are unable to feel pressure or pain in your feet.  You develop problems with digestion such as: ? Nausea. ? Vomiting. ? Bloating. ? Constipation. ? Diarrhea. ? Abdominal pain.  You have difficulty with urination, such as: ? Incontinence. ?  Retention.  You have palpitations.  You develop orthostatic hypotension. When you stand up you may feel: ? Dizzy. ? Weak. ? Faint.  You cannot attain and maintain an erection (in men).  You have vaginal dryness and problems with decreased sexual desire and arousal (in women).  You have severe pain in your  thighs, legs, or buttocks.  You have unexplained weight loss. This information is not intended to replace advice given to you by your health care provider. Make sure you discuss any questions you have with your health care provider. Document Released: 06/24/2001 Document Revised: 09/21/2015 Document Reviewed: 09/24/2012 Elsevier Interactive Patient Education  2017 [employer]  Knee Pain, Adult Knee pain in adults is common. It can be caused by many things, including:  Arthritis.  A fluid-filled sac (cyst) or growth in your knee.  An infection in your knee.  An injury that will not heal.  Damage, swelling, or irritation of the tissues that support your knee.  Knee pain is usually not a sign of a serious problem. The pain may go away on its own with time and rest. If it does not, a health care provider may order tests to find the cause of the pain. These may include:  Imaging tests, such as an X-ray, MRI, or ultrasound.  Joint aspiration. In this test, fluid is removed from the knee.  Arthroscopy. In this test, a lighted tube is inserted into knee and an image is projected onto a TV screen.  A biopsy. In this test, a sample of tissue is removed from the body and studied under a microscope.  Follow these instructions at home: Pay attention to any changes in your symptoms. Take these actions to relieve your pain. Activity  Rest your knee.  Do not do things that cause pain or make pain worse.  Avoid high-impact activities or exercises, such as running, jumping rope, or doing jumping jacks. General instructions  Take over-the-counter and prescription medicines only as told by your health care provider.  Raise (elevate) your knee above the level of your heart when you are sitting or lying down.  Sleep with a pillow under your knee.  If directed, apply ice to the knee: ? Put ice in a plastic bag. ? Place a towel between your skin and the bag. ? Leave the ice on for 20  minutes, 2-3 times a day.  Ask your health care provider if you should wear an elastic knee support.  Lose weight if you are overweight. Extra weight can put pressure on your knee.  Do not use any products that contain nicotine or tobacco, such as cigarettes and e-cigarettes. Smoking may slow the healing of any bone and joint problems that you may have. If you need help quitting, ask your health care provider. Contact a health care provider if:  Your knee pain continues, changes, or gets worse.  You have a fever along with knee pain.  Your knee buckles or locks up.  Your knee swells, and the swelling becomes worse. Get help right away if:  Your knee feels warm to the touch.  You cannot move your knee.  You have severe pain in your knee.  You have chest pain.  You have trouble breathing. Summary  Knee pain in adults is common. It can be caused by many things, including, arthritis, infection, cysts, or injury.  Knee pain is usually not a sign of a serious problem, but if it does not go away, a health care provider may  perform tests to know the cause of the pain.  Pay attention to any changes in your symptoms. Relieve your pain with rest, medicines, light activity, and use of ice.  Get help if your pain continues or becomes very severe, or if your knee buckles or locks up, or if you have chest pain or trouble breathing. This information is not intended to replace advice given to you by your health care provider. Make sure you discuss any questions you have with your health care provider. Document Released: 02/10/2007 Document Revised: 04/05/2016 Document Reviewed: 04/05/2016 Elsevier Interactive Patient Education  2018 [employer].  Diabetes and Foot Care Diabetes may cause you to have problems because of poor blood supply (circulation) to your feet and legs. This may cause the skin on your feet to become thinner, break easier, and heal more slowly. Your skin may become dry,  and the skin may peel and crack. You may also have nerve damage in your legs and feet causing decreased feeling in them. You may not notice minor injuries to your feet that could lead to infections or more serious problems. Taking care of your feet is one of the most important things you can do for yourself. Follow these instructions at home:  Wear shoes at all times, even in the house. Do not go barefoot. Bare feet are easily injured.  Check your feet daily for blisters, cuts, and redness. If you cannot see the bottom of your feet, use a mirror or ask someone for help.  Wash your feet with warm water (do not use hot water) and mild soap. Then pat your feet and the areas between your toes until they are completely dry. Do not soak your feet as this can dry your skin.  Apply a moisturizing lotion or petroleum jelly (that does not contain alcohol and is unscented) to the skin on your feet and to dry, brittle toenails. Do not apply lotion between your toes.  Trim your toenails straight across. Do not dig under them or around the cuticle. File the edges of your nails with an emery board or nail file.  Do not cut corns or calluses or try to remove them with medicine.  Wear clean socks or stockings every day. Make sure they are not too tight. Do not wear knee-high stockings since they may decrease blood flow to your legs.  Wear shoes that fit properly and have enough cushioning. To break in new shoes, wear them for just a few hours a day. This prevents you from injuring your feet. Always look in your shoes before you put them on to be sure there are no objects inside.  Do not cross your legs. This may decrease the blood flow to your feet.  If you find a minor scrape, cut, or break in the skin on your feet, keep it and the skin around it clean and dry. These areas may be cleansed with mild soap and water. Do not cleanse the area with peroxide, alcohol, or iodine.  When you remove an adhesive bandage,  be sure not to damage the skin around it.  If you have a wound, look at it several times a day to make sure it is healing.  Do not use heating pads or hot water bottles. They may burn your skin. If you have lost feeling in your feet or legs, you may not know it is happening until it is too late.  Make sure your health care provider performs a complete foot exam  at least annually or more often if you have foot problems. Report any cuts, sores, or bruises to your health care provider immediately. Contact a health care provider if:  You have an injury that is not healing.  You have cuts or breaks in the skin.  You have an ingrown nail.  You notice redness on your legs or feet.  You feel burning or tingling in your legs or feet.  You have pain or cramps in your legs and feet.  Your legs or feet are numb.  Your feet always feel cold. Get help right away if:  There is increasing redness, swelling, or pain in or around a wound.  There is a red line that goes up your leg.  Pus is coming from a wound.  You develop a fever or as directed by your health care provider.  You notice a bad smell coming from an ulcer or wound. This information is not intended to replace advice given to you by your health care provider. Make sure you discuss any questions you have with your health care provider. Document Released: 04/12/2000 Document Revised: 09/21/2015 Document Reviewed: 09/22/2012 Elsevier Interactive Patient Education  2017 [employer].

## 2018-01-07 NOTE — Progress Notes (Signed)
Subjective:    Patient ID: Jasmine Carlson, female    DOB: 1941-03-10, 77 y.o.   MRN: [id number]  No chief complaint on file.   HPI Patient was seen today for ongoing concerns.  Patient endorses continued bilateral knee pain thought 2/2 arthritis.  Pt tried Voltaren gel and had knee injections at [facility].  Knee xrays were ordered by this provider, but pt had them done at [facility].  Pt states the steroid injection lasted 3 wks.  She got another set of injections weekly, but they did not work.  Pt takes Tylenol prn.   Pt staying at home more 2/2 knee pain when walking and difficulty with steps.  Pt has a h/o GIB.  Pt also endorses b/l foot pain.  Pt notes an itching inside her foot, an ache at the top and bottom, as well as a tingle in her b/l big toes. The feeling does not last long.   Patient taking metformin 500 mg daily and checking blood sugars occasionally.  Past Medical History:  Diagnosis Date  . Arthritis   . Breast cancer ([place])   . Colon polyps   . Diabetes mellitus without complication ([place])   . GI bleed   . Hyperlipidemia   . Hypertension   . Personal history of chemotherapy   . Personal history of radiation therapy   . Stroke ([place])     No Known Allergies  ROS General: Denies fever, chills, night sweats, changes in weight, changes in appetite HEENT: Denies headaches, ear pain, changes in vision, rhinorrhea, sore throat CV: Denies CP, palpitations, SOB, orthopnea Pulm: Denies SOB, cough, wheezing GI: Denies abdominal pain, nausea, vomiting, diarrhea, constipation GU: Denies dysuria, hematuria, frequency, vaginal discharge Msk: Denies muscle cramps, joint pains  + knee pain bilateral foot ache/tingling Neuro: Denies weakness, numbness  + tingling in feet Skin: Denies rashes, bruising Psych: Denies depression, anxiety, hallucinations     Objective:    Blood pressure (!) 146/82, pulse 76, temperature 98 F (36.7 C), temperature source Oral, weight 172 lb (78 kg), SpO2 96  %.   Gen. Pleasant, well-nourished, in no distress, normal affect   HEENT: [place]/AT, face symmetric, no scleral icterus, PERRLA, nares patent without drainage Lungs: no accessory muscle use, CTAB, no wheezes or rales Cardiovascular: RRR, no m/r/g, no peripheral edema Musculoskeletal: No deformities, no cyanosis or clubbing, normal tone.  TTP of lateral joint line of right and left knee.  No edema noted mild crepitus of left knee. Neuro:  A&Ox3, CN II-XII intact, normal gait Skin:  Warm, no lesions/ rash  Diabetic Foot Exam - Simple   Simple Foot Form Diabetic Foot exam was performed with the following findings:  Yes 01/07/2018  8:38 AM  Visual Inspection No deformities, no ulcerations, no other skin breakdown bilaterally:  Yes Sensation Testing See comments:  Yes Pulse Check Posterior Tibialis and Dorsalis pulse intact bilaterally:  Yes Comments Decreased pinprick and vibratory sense of left foot compared to right.  No calluses noted     Wt Readings from Last 3 Encounters:  01/07/18 172 lb (78 kg)  11/04/17 172 lb (78 kg)  10/08/17 171 lb 12.8 oz (77.9 kg)    Lab Results  Component Value Date   WBC 5.0 11/04/2017   HGB 11.9 (L) 11/04/2017   HCT 38.9 11/04/2017   PLT 241 11/04/2017   GLUCOSE 164 (H) 10/08/2017   ALT 6 10/08/2017   AST 12 10/08/2017   NA 139 10/08/2017   K 4.6 10/08/2017   CL  105 10/08/2017   CREATININE 1.25 (H) 10/08/2017   BUN 21 10/08/2017   CO2 25 10/08/2017   INR 1.15 11/04/2017   HGBA1C 7.7 07/09/2017    Assessment/Plan:  Type 2 diabetes mellitus with diabetic neuropathy, without long-term current use of insulin (HCC) -Hemoglobin A1c 7.7% on 07/09/2017 -Continue metformin 500 mg daily -Continue lifestyle modification - Plan: gabapentin (NEURONTIN) 100 MG capsule -Foot exam done this visit -We will recheck hemoglobin A1c at next visit  Chronic pain of both knees  -Given continued knee pain despite injections will refer patient to orthopedic  surgery. -Will be beneficial to obtain x-rays of bilateral knees from [facility] - Plan: Ambulatory referral to Orthopedic Surgery  Follow-up in the next few months, sooner if needed  Grier Mitts, MD

## 2018-01-09 ENCOUNTER — Encounter: Payer: Self-pay | Admitting: Family Medicine

## 2018-01-13 ENCOUNTER — Ambulatory Visit (INDEPENDENT_AMBULATORY_CARE_PROVIDER_SITE_OTHER): Payer: Self-pay | Admitting: Orthopaedic Surgery

## 2018-02-03 ENCOUNTER — Ambulatory Visit (INDEPENDENT_AMBULATORY_CARE_PROVIDER_SITE_OTHER): Payer: Self-pay

## 2018-02-03 ENCOUNTER — Telehealth (INDEPENDENT_AMBULATORY_CARE_PROVIDER_SITE_OTHER): Payer: Self-pay

## 2018-02-03 ENCOUNTER — Ambulatory Visit (INDEPENDENT_AMBULATORY_CARE_PROVIDER_SITE_OTHER): Payer: Medicare HMO | Admitting: Orthopaedic Surgery

## 2018-02-03 ENCOUNTER — Encounter (INDEPENDENT_AMBULATORY_CARE_PROVIDER_SITE_OTHER): Payer: Self-pay | Admitting: Orthopaedic Surgery

## 2018-02-03 ENCOUNTER — Ambulatory Visit (INDEPENDENT_AMBULATORY_CARE_PROVIDER_SITE_OTHER): Payer: Medicare HMO

## 2018-02-03 VITALS — Ht 63.0 in | Wt 172.0 lb

## 2018-02-03 DIAGNOSIS — M170 Bilateral primary osteoarthritis of knee: Secondary | ICD-10-CM | POA: Diagnosis not present

## 2018-02-03 DIAGNOSIS — M25562 Pain in left knee: Secondary | ICD-10-CM | POA: Diagnosis not present

## 2018-02-03 DIAGNOSIS — M25561 Pain in right knee: Secondary | ICD-10-CM

## 2018-02-03 DIAGNOSIS — E1142 Type 2 diabetes mellitus with diabetic polyneuropathy: Secondary | ICD-10-CM | POA: Diagnosis not present

## 2018-02-03 MED ORDER — BUPIVACAINE HCL 0.25 % IJ SOLN
2.0000 mL | INTRAMUSCULAR | Status: AC | PRN
  Administered 2018-02-03: 2 mL via INTRA_ARTICULAR

## 2018-02-03 MED ORDER — METHYLPREDNISOLONE ACETATE 40 MG/ML IJ SUSP
40.0000 mg | INTRAMUSCULAR | Status: AC | PRN
  Administered 2018-02-03: 40 mg via INTRA_ARTICULAR

## 2018-02-03 MED ORDER — LIDOCAINE HCL 1 % IJ SOLN
2.0000 mL | INTRAMUSCULAR | Status: AC | PRN
  Administered 2018-02-03: 2 mL

## 2018-02-03 NOTE — Telephone Encounter (Signed)
Please submit for gel injection for Left Knee- Dr Erlinda Hong.

## 2018-02-03 NOTE — Progress Notes (Signed)
Office Visit Note   Patient: Jasmine Carlson           Date of Birth: March 07, 1941           MRN: [id number] Visit Date: 02/03/2018              Requested by: [name], MD [place], [place] [address] PCP: [name], MD   Assessment & Plan: Visit Diagnoses:  1. Bilateral primary osteoarthritis of knee   2. Acute pain of right knee     Plan: Impression is in stage of joint disease bilateral knees right greater than left.  In regards to the left knee, we will proceed with cortisone injection today.  We will also send for approval for Visco supplementation injection.  In regards to the right knee, we will schedule her for a right total knee replacement as she is failed all conservative treatment options.  She is on Plavix for her TIA so we will get clearance from her primary care provider to include when to stop her Plavix.  We will also repeat her A1c today.  She does mention she has 2 sons at home and would like to go home with home health PT following her total knee replacement.  She will follow-up with [place] for left knee Visco supplementation injection once approved.  Call with concerns or questions in the meantime.  Follow-Up Instructions: Return if symptoms worsen or fail to improve.   Orders:  Orders Placed This Encounter  Procedures  . Large Joint Inj: L knee  . XR Knee 1-2 Views Right  . XR Knee 1-2 Views Left  . Hemoglobin A1C   No orders of the defined types were placed in this encounter.     Procedures: Large Joint Inj: L knee on 02/03/2018 10:12 AM Indications: pain Details: 22 G needle, anterolateral approach Medications: 2 mL lidocaine 1 %; 2 mL bupivacaine 0.25 %; 40 mg methylPREDNISolone acetate 40 MG/ML      Clinical Data: No additional findings.   Subjective: Chief Complaint  Patient presents with  . Right Knee - Pain  . Left Knee - Pain    HPI patient is a pleasant 77 year old female who presents to our clinic today with  bilateral knee pain right greater than left.  History of osteoarthritis for the past several years.  This is gradually worsened.  The pain she has is to the entire knee both sides.  She has associated stiffness which is worse going from a seated to standing position.  She does have occasional pain at night.  She has taken over-the-counter medications as well as a new prescription of gabapentin for her diabetic neuropathy with mild relief of symptoms.  She has had an intra-articular cortisone injection as well as Visco supplementation injections without relief of symptoms.  No previous cortisone or viscosupplementation injections to the left knee.  Of note, she does have a history of a TIA as well as diabetes with her most recent A1c of 7.7 in March 2019.  Review of Systems as detailed in HPI.  All others reviewed and are negative.   Objective: Vital Signs: Ht 5\' 3"  (1.6 m)   Wt 172 lb (78 kg)   BMI 30.47 kg/m   Physical Exam well-developed well-nourished female no acute distress.  Alert and oriented x3.  Ortho Exam examination of both knees reveals varus deformity right greater than left.  Marked patellofemoral crepitus.  Minimal joint line tenderness.  Ligaments are stable.  She is neurovascular intact distally.  Specialty Comments:  No specialty comments available.  Imaging: Xr Knee 1-2 Views Left  Result Date: 02/03/2018 Severe tricompartmental degenerative changes  Xr Knee 1-2 Views Right  Result Date: 02/03/2018 Severe tricompartmental degenerative changes    PMFS History: Patient Active Problem List   Diagnosis Date Noted  . Acute pain of right knee 02/03/2018  . History of TIA (transient ischemic attack) 05/27/2017  . Arthritis 05/27/2017  . Breast cancer of upper-inner quadrant of right female breast ([place]) 01/02/2017   Past Medical History:  Diagnosis Date  . Arthritis   . Breast cancer ([place])   . Colon polyps   . Diabetes mellitus without complication ([place])   . GI  bleed   . Hyperlipidemia   . Hypertension   . Personal history of chemotherapy   . Personal history of radiation therapy   . Stroke [facility])     Family History  Problem Relation Age of Onset  . Heart attack Mother   . Diabetes Mother   . Hypertension Mother   . Hyperlipidemia Mother   . Throat cancer Father   . Breast cancer Sister        postmenopausal; double mastectomy  . HIV Son   . Throat cancer Brother   . Throat cancer Brother     Past Surgical History:  Procedure Laterality Date  . ABDOMINAL HYSTERECTOMY     30 years ago  . BREAST BIOPSY    . BREAST LUMPECTOMY Right 08/02/2014  . IR REMOVAL TUN ACCESS W/ PORT W/O FL MOD SED  11/04/2017   Social History   Occupational History  . Occupation: retired  Tobacco Use  . Smoking status: Never Smoker  . Smokeless tobacco: Never Used  Substance and Sexual Activity  . Alcohol use: No    Comment: Pt quit drinking a long time ago.  . Drug use: No  . Sexual activity: Never

## 2018-02-04 ENCOUNTER — Telehealth (INDEPENDENT_AMBULATORY_CARE_PROVIDER_SITE_OTHER): Payer: Self-pay

## 2018-02-04 LAB — HEMOGLOBIN A1C
Hgb A1c MFr Bld: 9.7 % of total Hgb — ABNORMAL HIGH (ref <5.7)
Mean Plasma Glucose: 232 (calc)
eAG (mmol/L): 12.8 (calc)

## 2018-02-04 LAB — TIQ-NTM

## 2018-02-04 NOTE — Telephone Encounter (Signed)
Submitted VOB for Monovisc, left knee. 

## 2018-02-04 NOTE — Progress Notes (Signed)
Not good

## 2018-02-04 NOTE — Telephone Encounter (Signed)
Noted  

## 2018-02-04 NOTE — Progress Notes (Signed)
Need to postpone surgery

## 2018-02-20 ENCOUNTER — Telehealth (INDEPENDENT_AMBULATORY_CARE_PROVIDER_SITE_OTHER): Payer: Self-pay

## 2018-02-20 NOTE — Telephone Encounter (Signed)
PA required for Monovisc, right knee. ?Faxed completed PA form to [employer] at [phone]. ?

## 2018-02-23 ENCOUNTER — Telehealth: Payer: Self-pay

## 2018-02-23 NOTE — Telephone Encounter (Signed)
Copied from [place] [phone]. Topic: General - Other >> Feb 23, 2018 12:18 PM Carolyn Stare wrote:  Pt said all her meds  need to be refill and she has been speaking with [facility] and they dont have any refill RX'S from the doctor

## 2018-02-24 ENCOUNTER — Other Ambulatory Visit: Payer: Self-pay

## 2018-02-24 DIAGNOSIS — E1140 Type 2 diabetes mellitus with diabetic neuropathy, unspecified: Secondary | ICD-10-CM

## 2018-02-24 DIAGNOSIS — R69 Illness, unspecified: Secondary | ICD-10-CM | POA: Diagnosis not present

## 2018-02-24 DIAGNOSIS — E119 Type 2 diabetes mellitus without complications: Secondary | ICD-10-CM

## 2018-02-24 DIAGNOSIS — I10 Essential (primary) hypertension: Secondary | ICD-10-CM

## 2018-02-24 DIAGNOSIS — Z8673 Personal history of transient ischemic attack (TIA), and cerebral infarction without residual deficits: Secondary | ICD-10-CM

## 2018-02-24 MED ORDER — CLOPIDOGREL BISULFATE 75 MG PO TABS: 75.0000 mg | ORAL_TABLET | Freq: Every day | ORAL | 3 refills | Status: DC

## 2018-02-24 MED ORDER — GABAPENTIN 100 MG PO CAPS: 100.0000 mg | ORAL_CAPSULE | Freq: Three times a day (TID) | ORAL | 3 refills | Status: DC

## 2018-02-24 MED ORDER — METFORMIN HCL 500 MG PO TABS: 500.0000 mg | ORAL_TABLET | Freq: Every day | ORAL | 3 refills | Status: DC

## 2018-02-24 MED ORDER — ONETOUCH DELICA LANCETS FINE MISC: 11 refills | Status: DC

## 2018-02-24 MED ORDER — GLUCOSE BLOOD VI STRP: ORAL_STRIP | 12 refills | Status: DC

## 2018-02-24 MED ORDER — LOSARTAN POTASSIUM 100 MG PO TABS: 100.0000 mg | ORAL_TABLET | Freq: Every day | ORAL | 3 refills | Status: DC

## 2018-02-24 MED ORDER — SIMVASTATIN 40 MG PO TABS: 40.0000 mg | ORAL_TABLET | Freq: Every evening | ORAL | 3 refills | Status: DC

## 2018-02-24 MED ORDER — CARVEDILOL 6.25 MG PO TABS: 6.2500 mg | ORAL_TABLET | Freq: Two times a day (BID) | ORAL | 3 refills | Status: DC

## 2018-02-24 NOTE — Telephone Encounter (Signed)
All pt Rx have been sent to [facility] for refills

## 2018-02-25 ENCOUNTER — Telehealth (INDEPENDENT_AMBULATORY_CARE_PROVIDER_SITE_OTHER): Payer: Self-pay

## 2018-02-25 NOTE — Telephone Encounter (Signed)
Called and left a VM for patient to call back and schedule an appointment to have gel injection with Dr. Erlinda Hong.  Patient is approved for Monovisc, left knee. [place] Patient will be responsible for 20% OOP.  Co-pay $25.00 PA required  PA Approval# [id number] Valid 02/24/2018- 05/27/2018

## 2018-02-27 NOTE — Telephone Encounter (Signed)
Pt calling to check to see if a new glucose meter was sent to the pharmacy as hers is broke? Please verify.

## 2018-03-03 NOTE — Telephone Encounter (Signed)
Spoke with pt states that her pharmacy sent her a new meter.

## 2018-03-05 ENCOUNTER — Telehealth: Payer: Self-pay

## 2018-03-05 ENCOUNTER — Other Ambulatory Visit: Payer: Self-pay | Admitting: Hematology

## 2018-03-05 MED ORDER — ANASTROZOLE 1 MG PO TABS: 1.0000 mg | ORAL_TABLET | Freq: Every day | ORAL | 0 refills | Status: DC

## 2018-03-05 NOTE — Telephone Encounter (Signed)
Patient calls wanting to know if she needs to continue taking the Anastrozole?

## 2018-03-06 ENCOUNTER — Telehealth: Payer: Self-pay

## 2018-03-06 NOTE — Telephone Encounter (Signed)
Spoke with patient per Dr. Burr Medico instructed to continue to take the anastrozole, a refill has been sent in.  Patient verbalized an understanding.

## 2018-03-20 ENCOUNTER — Telehealth: Payer: Self-pay | Admitting: Family Medicine

## 2018-03-20 NOTE — Telephone Encounter (Signed)
Patient declined to schedule AWV at this time. SF °

## 2018-04-08 NOTE — Progress Notes (Signed)
[place]   [phone] [phone]   Clinic Follow up Note   Patient Care Team: Jasmine Ruddy, MD as PCP - General (Family Medicine) 04/09/2018  CHIEF COMPLAINT: F/u on UIQ right breast cancer   SUMMARY OF ONCOLOGIC HISTORY: Oncology History   Cancer Staging Breast cancer of upper-inner quadrant of right female breast [facility]) Staging form: Breast, AJCC 8th Edition - Pathologic stage from 08/02/2014: Stage IA (pT1c, pN0, cM0, G2, ER: Positive, PR: Positive, HER2: Positive) - Signed by Jasmine Merle, MD on 01/04/2017 - Clinical: No stage assigned - Unsigned       Breast cancer of upper-inner quadrant of right female breast ([place])   05/25/2014 Mammogram    Suspicious lesion at 3 o'clock position in the right breast    06/15/2014 Breast US    Suspicious nodule, a spiculated hypoechoic mass, 1.1 cm at the 1 o'clock position    06/30/2014 Initial Biopsy    US Guided Core Biopsy: Invasive Ductal Carcinoma, [place] Grade 2. Addendum: ER positive (100%), PR positive (75-80%), HER2 positive     06/30/2014 Initial Diagnosis    Breast cancer of upper-inner quadrant of right female breast ([place]) from Right Breast Lumpectomy: Invasive Ductal Carcinoma, [place] Grade 2, surgical margins free of carcinoma; Sentinel nodes 2/2 free of tumor    08/02/2014 Surgery    Right Breast Lumpectomy with SLN biopsy; Attending physician Dr. Tamera Carlson at [facility] in [place], [place]    08/02/2014 Pathology Results    Right breast lumpectomy: Invasive ductal carcinoma, Jasmine Carlson Grade 2; size or largest invasive carcinoma 1.4 cm; Sentinel lymph nodes 2/2 free of tumor, margins free of tumor     09/22/2014 Echocardiogram    MUGA scan: normal ejection fraction of 57.7%    10/01/2014 - 10/03/2015 Adjuvant Chemotherapy    Adjuvant weekly Taxol from 08/02/38 to 01/04/10 complicated by fatigue, diarrhea, and leukopenia requiring treatment delays and dosage adjustments. Records  indicate she received 8 of 12 doses. Completed adjuvant Herceptin q3 weeks from 10/01/14 to 10/03/15.     06/12/2015 - 08/04/2015 Radiation Therapy    Received 4,860 cGy to right breast in 27 fractions Received 1,600 cGy boost in 8 fractions     09/12/2015 -  Anti-estrogen oral therapy    She began Arimidex after completing adjuvant radiation    06/25/2016 Imaging    CT Chest, Abdomen, Pelvis with contrast, compared with PET/CT on 04/25/2015 and CT 09/06/2014 - Impression: Chest: 1. Unchanged multiple subcentimeter pulmonary nodules bilaterally.there are no new pulmonary nodules. 2. No thoracic lymphadenopathy. 3. New right lung post radiation changes. 4. New diffuse right breast skin thickening suggestive of post radiation changes. Abdomen/pelvis 1. No new mass or lymphadenopathy in the abdomen and pelvis, no CT evidence of metastatic disease. 2. Unchanged adjacent soft tissue nodules inferior to the right hepatic lobe. 3. Colonic diverticulosis. 4. Status post hysterectomy.    06/29/2016 Mammogram    Digital bilateral mammogram with CAD and TOMO findings: the breast parenchyma demonstrates scattered fibroglandular densities. There are no suspicious calcifications, masses, architectural distortion or skin thickening. Surgical clips are present in the right breast at posterior depth. Vascular calcifications are present. No mammographic evidence of malignancy or referred is is gravida    12/23/2017 Mammogram    12/23/2017 Mammogram IMPRESSION: No mammographic evidence of malignancy in either breast, status post right lumpectomy.     CURRENT THERAPY Anastrozole 75m daily starting 09/12/15   INTERVAL HISTORY: Jasmine Carlson a 77y.o. female  who is here for follow-up. Since our last visit, she had a mammogram which was benign. Today, she is here alone. She removed her port. She says that her BG is improving. She plans to have knee replacement soon. She says that she stumbles and her knees hurt when  she climbs stairs, but is able to stay active indoor. She avoids leaving the house a lot due to fear of falling. She is tolerating Anastrozole well and denies side effects.    Pertinent positives and negatives of review of systems are listed and detailed within the above HPI.  REVIEW OF SYSTEMS:   Constitutional: Denies fevers, chills or abnormal weight loss Eyes: Denies blurriness of vision Ears, nose, mouth, throat, and face: Denies mucositis or sore throat Respiratory: Denies cough, dyspnea or wheezes Cardiovascular: Denies palpitation, chest discomfort or lower extremity swelling Gastrointestinal:  Denies nausea, heartburn or change in bowel habits Skin: Denies abnormal skin rashes Lymphatics: Denies new lymphadenopathy or easy bruising Neurological:Denies numbness, tingling or new weaknesses Behavioral/Psych: Mood is stable, no new changes  MSK: (+) knee pain  All other systems were reviewed with the patient and are negative.  MEDICAL HISTORY:  Past Medical History:  Diagnosis Date  . Arthritis   . Breast cancer ([place])   . Colon polyps   . Diabetes mellitus without complication ([place])   . GI bleed   . Hyperlipidemia   . Hypertension   . Personal history of chemotherapy   . Personal history of radiation therapy   . Stroke [facility])     SURGICAL HISTORY: Past Surgical History:  Procedure Laterality Date  . ABDOMINAL HYSTERECTOMY     30 years ago  . BREAST BIOPSY    . BREAST LUMPECTOMY Right 08/02/2014  . IR REMOVAL TUN ACCESS W/ PORT W/O FL MOD SED  11/04/2017    I have reviewed the social history and family history with the patient and they are unchanged from previous note.  ALLERGIES:  has No Known Allergies.  MEDICATIONS:  Current Outpatient Medications  Medication Sig Dispense Refill  . anastrozole (ARIMIDEX) 1 MG tablet Take 1 tablet (1 mg total) by mouth daily. 90 tablet 1  . carvedilol (COREG) 6.25 MG tablet Take 1 tablet (6.25 mg total) by mouth 2 (two) times  daily with a meal. 180 tablet 3  . clopidogrel (PLAVIX) 75 MG tablet Take 1 tablet (75 mg total) by mouth daily. 90 tablet 3  . gabapentin (NEURONTIN) 100 MG capsule Take 1 capsule (100 mg total) by mouth 3 (three) times daily. 90 capsule 3  . glucose blood (ONETOUCH VERIO) test strip Patient is to check blood sugar 1-2 times daily. Dx. E11.9 100 each 12  . losartan (COZAAR) 100 MG tablet Take 1 tablet (100 mg total) by mouth daily. 90 tablet 3  . metFORMIN (GLUCOPHAGE) 500 MG tablet Take 1 tablet (500 mg total) by mouth daily with supper. 90 tablet 3  . nystatin (MYCOSTATIN) 100000 UNIT/ML suspension Take 5 mLs (500,000 Units total) by mouth 4 (four) times daily. 60 mL 0  . Nystatin POWD 1 application by Does not apply route 2 (two) times daily. 1 Bottle 0  . [place] Patient is to check blood sugar 1-2 times daily. 100 each 11  . simvastatin (ZOCOR) 40 MG tablet Take 1 tablet (40 mg total) by mouth every evening. 90 tablet 3   Current Facility-Administered Medications  Medication Dose Route Frequency Provider Last Rate Last Dose  . 0.9 %  sodium  chloride infusion  500 mL Intravenous Once Irene Shipper, MD        PHYSICAL EXAMINATION: ECOG PERFORMANCE STATUS: 0 - Asymptomatic  Vitals:   04/09/18 0954 04/09/18 0956  BP: (!) 180/101 (!) 178/83  Pulse: 73   Resp: 18   Temp: 98.3 F (36.8 C)   SpO2: 100%    Filed Weights   04/09/18 0954  Weight: 172 lb 8 oz (78.2 kg)    GENERAL:alert, no distress and comfortable SKIN: skin color, texture, turgor are normal, no rashes or significant lesions EYES: normal, Conjunctiva are pink and non-injected, sclera clear OROPHARYNX:no exudate, no erythema and lips, buccal mucosa, and tongue normal  NECK: supple, thyroid normal size, non-tender, without nodularity LYMPH:  no palpable lymphadenopathy in the cervical, axillary or inguinal LUNGS: clear to auscultation and percussion with normal breathing effort HEART: regular  rate & rhythm and no murmurs and no lower extremity edema ABDOMEN:abdomen soft, non-tender and normal bowel sounds Musculoskeletal:no cyanosis of digits and no clubbing  NEURO: alert & oriented x 3 with fluent speech, no focal motor/sensory deficits Breast: No palpable masses or changes in skin or nipples, no palpable adenopathy.  LABORATORY DATA:  I have reviewed the data as listed CBC Latest Ref Rng & Units 04/09/2018 11/04/2017 10/08/2017  WBC 4.0 - 10.5 K/uL 6.0 5.0 5.1  Hemoglobin 12.0 - 15.0 g/dL 12.3 11.9(L) 11.3(L)  Hematocrit 36.0 - 46.0 % 39.9 38.9 36.6  Platelets 150 - 400 K/uL 202 241 237     CMP Latest Ref Rng & Units 04/09/2018 10/08/2017 01/02/2017  Glucose 70 - 99 mg/dL 180(H) 164(H) 118  BUN 8 - 23 mg/dL 19 21 14.2  Creatinine 0.44 - 1.00 mg/dL 1.35(H) 1.25(H) 1.1  Sodium 135 - 145 mmol/L 142 139 138  Potassium 3.5 - 5.1 mmol/L 4.8 4.6 4.3  Chloride 98 - 111 mmol/L 106 105 -  CO2 22 - 32 mmol/L _0 Calcium 8.9 - 10.3 mg/dL 9.9 9.8 9.8  Total Protein 6.5 - 8.1 g/dL 7.4 7.1 7.4  Total Bilirubin 0.3 - 1.2 mg/dL 0.4 0.3 0.34  Alkaline Phos 38 - 126 U/L 86 93 82  AST 15 - 41 U/L 12(L) 12 15  ALT 0 - 44 U/L _1 RADIOGRAPHIC STUDIES: I have personally reviewed the radiological images as listed and agreed with the findings in the report. No results found.   12/23/2017 Mammogram IMPRESSION: No mammographic evidence of malignancy in either breast, status post right lumpectomy.  ASSESSMENT & PLAN:  Jasmine Carlson is a 77 y.o. female with history of  1. 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 Treated with surgery, chemo and radiation. Currently on anastrozole, started in 08/2015. Tolerating well. -I discussed and reviewed her 11/2017 mammogram, which was benign.  -Labs reviewed, CBC is WNLs. CMP pending.  -He is clinically doing well, breast exam was  unremarkable, no clinical concern for recurrence -Continue Anastrozole, for total 5 years -f/u in 6 months. -Will order mammogram next visit   2. Bone Health -I previously encouraged her to take Calcium and Vitamin D. -She is overdue her 2019 DEXA scan. -I educated her about how anastrozole can reduce bone density. I encouraged her to take Vitamin D  -I will reorder DEXA to be done at [facility].   3. HTN, dizziness, DM -f/u with PCP -Overall stable  4. CKD -She has developed mild  renal insufficiency, probably related to diabetes and hypertension -I encouraged her to drink fluids adequately, and follow-up with her PCP   Plan  -continue Anastrozole -f/u in 6 months -DEXA scan before next visit at [facility] order mammogram next visit   No problem-specific Assessment & Plan notes found for this encounter.   Orders Placed This Encounter  Procedures  . DG Bone Density    Standing Status:   Future    Standing Expiration Date:   04/09/2019    Order Specific Question:   Reason for Exam (SYMPTOM  OR DIAGNOSIS REQUIRED)    Answer:   screening    Order Specific Question:   Preferred imaging location?    Answer:   [facility]   All questions were answered. The patient knows to call the clinic with any problems, questions or concerns. No barriers to learning was detected. I spent 15 minutes counseling the patient face to face. The total time spent in the appointment was 20 minutes and more than 50% was on counseling and review of test results  I, Noor Dweik am acting as scribe for Dr. Truitt Carlson.  I have reviewed the above documentation for accuracy and completeness, and I agree with the above.     Jasmine Merle, MD 04/09/2018

## 2018-04-09 ENCOUNTER — Encounter: Payer: Self-pay | Admitting: Hematology

## 2018-04-09 ENCOUNTER — Inpatient Hospital Stay: Payer: Medicare HMO | Attending: Hematology

## 2018-04-09 ENCOUNTER — Telehealth: Payer: Self-pay

## 2018-04-09 ENCOUNTER — Inpatient Hospital Stay (HOSPITAL_BASED_OUTPATIENT_CLINIC_OR_DEPARTMENT_OTHER): Payer: Medicare HMO | Admitting: Hematology

## 2018-04-09 VITALS — BP 178/83 | HR 73 | Temp 98.3°F | Resp 18 | Ht 63.0 in | Wt 172.5 lb

## 2018-04-09 DIAGNOSIS — Z923 Personal history of irradiation: Secondary | ICD-10-CM | POA: Diagnosis not present

## 2018-04-09 DIAGNOSIS — E785 Hyperlipidemia, unspecified: Secondary | ICD-10-CM | POA: Diagnosis not present

## 2018-04-09 DIAGNOSIS — N189 Chronic kidney disease, unspecified: Secondary | ICD-10-CM | POA: Diagnosis not present

## 2018-04-09 DIAGNOSIS — E1122 Type 2 diabetes mellitus with diabetic chronic kidney disease: Secondary | ICD-10-CM

## 2018-04-09 DIAGNOSIS — Z170 Estrogen receptor positive status [ER+]: Secondary | ICD-10-CM

## 2018-04-09 DIAGNOSIS — Z9221 Personal history of antineoplastic chemotherapy: Secondary | ICD-10-CM

## 2018-04-09 DIAGNOSIS — Z7984 Long term (current) use of oral hypoglycemic drugs: Secondary | ICD-10-CM | POA: Insufficient documentation

## 2018-04-09 DIAGNOSIS — I129 Hypertensive chronic kidney disease with stage 1 through stage 4 chronic kidney disease, or unspecified chronic kidney disease: Secondary | ICD-10-CM

## 2018-04-09 DIAGNOSIS — M1990 Unspecified osteoarthritis, unspecified site: Secondary | ICD-10-CM | POA: Diagnosis not present

## 2018-04-09 DIAGNOSIS — Z8673 Personal history of transient ischemic attack (TIA), and cerebral infarction without residual deficits: Secondary | ICD-10-CM | POA: Insufficient documentation

## 2018-04-09 DIAGNOSIS — Z79811 Long term (current) use of aromatase inhibitors: Secondary | ICD-10-CM | POA: Diagnosis not present

## 2018-04-09 DIAGNOSIS — C50211 Malignant neoplasm of upper-inner quadrant of right female breast: Secondary | ICD-10-CM

## 2018-04-09 DIAGNOSIS — Z79899 Other long term (current) drug therapy: Secondary | ICD-10-CM | POA: Diagnosis not present

## 2018-04-09 DIAGNOSIS — Z7902 Long term (current) use of antithrombotics/antiplatelets: Secondary | ICD-10-CM | POA: Insufficient documentation

## 2018-04-09 DIAGNOSIS — R42 Dizziness and giddiness: Secondary | ICD-10-CM

## 2018-04-09 DIAGNOSIS — C50911 Malignant neoplasm of unspecified site of right female breast: Secondary | ICD-10-CM

## 2018-04-09 DIAGNOSIS — E2839 Other primary ovarian failure: Secondary | ICD-10-CM

## 2018-04-09 LAB — COMPREHENSIVE METABOLIC PANEL
ALBUMIN: 3.6 g/dL (ref 3.5–5.0)
ALT: 7 U/L (ref 0–44)
AST: 12 U/L — AB (ref 15–41)
Alkaline Phosphatase: 86 U/L (ref 38–126)
Anion gap: 10 (ref 5–15)
BUN: 19 mg/dL (ref 8–23)
CO2: 26 mmol/L (ref 22–32)
Calcium: 9.9 mg/dL (ref 8.9–10.3)
Chloride: 106 mmol/L (ref 98–111)
Creatinine, Ser: 1.35 mg/dL — ABNORMAL HIGH (ref 0.44–1.00)
GFR, EST AFRICAN AMERICAN: 43 mL/min — AB (ref >60)
GFR, EST NON AFRICAN AMERICAN: 38 mL/min — AB (ref >60)
Glucose, Bld: 180 mg/dL — ABNORMAL HIGH (ref 70–99)
Potassium: 4.8 mmol/L (ref 3.5–5.1)
Sodium: 142 mmol/L (ref 135–145)
TOTAL PROTEIN: 7.4 g/dL (ref 6.5–8.1)
Total Bilirubin: 0.4 mg/dL (ref 0.3–1.2)

## 2018-04-09 LAB — CBC WITH DIFFERENTIAL/PLATELET
Abs Immature Granulocytes: 0.04 10*3/uL (ref 0.00–0.07)
Basophils Absolute: 0 10*3/uL (ref 0.0–0.1)
Basophils Relative: 1 %
EOS PCT: 3 %
Eosinophils Absolute: 0.2 10*3/uL (ref 0.0–0.5)
HCT: 39.9 % (ref 36.0–46.0)
HEMOGLOBIN: 12.3 g/dL (ref 12.0–15.0)
Immature Granulocytes: 1 %
LYMPHS PCT: 21 %
Lymphs Abs: 1.3 10*3/uL (ref 0.7–4.0)
MCH: 26.9 pg (ref 26.0–34.0)
MCHC: 30.8 g/dL (ref 30.0–36.0)
MCV: 87.3 fL (ref 80.0–100.0)
Monocytes Absolute: 0.6 10*3/uL (ref 0.1–1.0)
Monocytes Relative: 10 %
Neutro Abs: 3.9 10*3/uL (ref 1.7–7.7)
Neutrophils Relative %: 64 %
Platelets: 202 10*3/uL (ref 150–400)
RBC: 4.57 MIL/uL (ref 3.87–5.11)
RDW: 14.5 % (ref 11.5–15.5)
WBC: 6 10*3/uL (ref 4.0–10.5)
nRBC: 0 % (ref 0.0–0.2)

## 2018-04-09 MED ORDER — ANASTROZOLE 1 MG PO TABS: 1.0000 mg | ORAL_TABLET | Freq: Every day | ORAL | 1 refills | Status: DC

## 2018-04-09 NOTE — Telephone Encounter (Signed)
Printed avs and calender of upcoming appointment. Per 12/12 los 

## 2018-05-08 ENCOUNTER — Ambulatory Visit (INDEPENDENT_AMBULATORY_CARE_PROVIDER_SITE_OTHER): Payer: PPO | Admitting: Orthopaedic Surgery

## 2018-05-08 ENCOUNTER — Encounter (INDEPENDENT_AMBULATORY_CARE_PROVIDER_SITE_OTHER): Payer: Self-pay | Admitting: Orthopaedic Surgery

## 2018-05-08 DIAGNOSIS — M170 Bilateral primary osteoarthritis of knee: Secondary | ICD-10-CM

## 2018-05-08 NOTE — Progress Notes (Signed)
Office Visit Note   Patient: Jasmine Carlson           Date of Birth: 07-17-1940           MRN: [id number] Visit Date: 05/08/2018              Requested by: [name], MD [place], [place] [address] PCP: [name], MD   Assessment & Plan: Visit Diagnoses:  1. Bilateral primary osteoarthritis of knee     Plan: Today we will recheck an A1c level.  I suspect that this will still be above 8.0.  I have recommended that she see her primary care doctor to get her sugars under better control so that we can safely perform a total knee replacement.  Again we reiterated that her A1c level would need to be less than 8.0.  She will also need a preoperative clearance from her PCP Dr. Grier Mitts.  She will need to stop her Plavix preoperatively when the time comes to schedule her knee replacement surgery.  Questions encouraged and answered.  Follow-up in 3 months for recheck of A1c level.  Follow-Up Instructions: Return in about 3 months (around 08/07/2018).   Orders:  Orders Placed This Encounter  Procedures  . Hemoglobin A1C   No orders of the defined types were placed in this encounter.     Procedures: No procedures performed   Clinical Data: No additional findings.   Subjective: Chief Complaint  Patient presents with  . Left Knee - Pain  . Right Knee - Pain    Ming follows up today for repeat A1c check to see if she can schedule total knee replacement surgery.  She states that she has not noticed a huge improvement in her sugar levels.  Her knees continue to bother her constantly and at night.  Cortisone injections have given her minimal relief.  She does have a history of TIA and she is on Plavix.   Review of Systems   Objective: Vital Signs: There were no vitals taken for this visit.  Physical Exam  Ortho Exam Bilateral knee exams are stable. Specialty Comments:  No specialty comments available.  Imaging: No results found.   PMFS  History: Patient Active Problem List   Diagnosis Date Noted  . Acute pain of right knee 02/03/2018  . History of TIA (transient ischemic attack) 05/27/2017  . Arthritis 05/27/2017  . Breast cancer of upper-inner quadrant of right female breast ([place]) 01/02/2017   Past Medical History:  Diagnosis Date  . Arthritis   . Breast cancer ([place])   . Colon polyps   . Diabetes mellitus without complication ([place])   . GI bleed   . Hyperlipidemia   . Hypertension   . Personal history of chemotherapy   . Personal history of radiation therapy   . Stroke [facility])     Family History  Problem Relation Age of Onset  . Heart attack Mother   . Diabetes Mother   . Hypertension Mother   . Hyperlipidemia Mother   . Throat cancer Father   . Breast cancer Sister        postmenopausal; double mastectomy  . HIV Son   . Throat cancer Brother   . Throat cancer Brother     Past Surgical History:  Procedure Laterality Date  . ABDOMINAL HYSTERECTOMY     30 years ago  . BREAST BIOPSY    . BREAST LUMPECTOMY Right 08/02/2014  . IR REMOVAL TUN ACCESS W/  PORT W/O FL MOD SED  11/04/2017   Social History   Occupational History  . Occupation: retired  Tobacco Use  . Smoking status: Never Smoker  . Smokeless tobacco: Never Used  Substance and Sexual Activity  . Alcohol use: No    Comment: Pt quit drinking a long time ago.  . Drug use: No  . Sexual activity: Never

## 2018-05-09 LAB — TIQ-NTM

## 2018-05-09 LAB — HEMOGLOBIN A1C
HEMOGLOBIN A1C: 8.6 %{Hb} — AB (ref <5.7)
Mean Plasma Glucose: 200 (calc)
eAG (mmol/L): 11.1 (calc)

## 2018-05-09 NOTE — Progress Notes (Signed)
Still too high.  It's better than 3 months ago but still not good enough to proceed with surgery.  Follow up in another 3 months.

## 2018-07-02 ENCOUNTER — Other Ambulatory Visit: Payer: Self-pay

## 2018-07-02 DIAGNOSIS — Z170 Estrogen receptor positive status [ER+]: Principal | ICD-10-CM

## 2018-07-02 DIAGNOSIS — C50211 Malignant neoplasm of upper-inner quadrant of right female breast: Secondary | ICD-10-CM

## 2018-07-02 MED ORDER — ANASTROZOLE 1 MG PO TABS: 1.0000 mg | ORAL_TABLET | Freq: Every day | ORAL | 1 refills | Status: DC

## 2018-07-03 ENCOUNTER — Other Ambulatory Visit: Payer: Self-pay | Admitting: Family Medicine

## 2018-07-03 DIAGNOSIS — I10 Essential (primary) hypertension: Secondary | ICD-10-CM

## 2018-07-03 DIAGNOSIS — E119 Type 2 diabetes mellitus without complications: Secondary | ICD-10-CM

## 2018-07-03 DIAGNOSIS — Z8673 Personal history of transient ischemic attack (TIA), and cerebral infarction without residual deficits: Secondary | ICD-10-CM

## 2018-07-03 MED ORDER — METFORMIN HCL 500 MG PO TABS: 500.0000 mg | ORAL_TABLET | Freq: Every day | ORAL | 1 refills | Status: DC

## 2018-07-03 MED ORDER — LOSARTAN POTASSIUM 100 MG PO TABS: 100.0000 mg | ORAL_TABLET | Freq: Every day | ORAL | 1 refills | Status: DC

## 2018-07-03 MED ORDER — CARVEDILOL 6.25 MG PO TABS: 6.2500 mg | ORAL_TABLET | Freq: Two times a day (BID) | ORAL | 1 refills | Status: DC

## 2018-07-03 MED ORDER — SIMVASTATIN 40 MG PO TABS: 40.0000 mg | ORAL_TABLET | Freq: Every evening | ORAL | 1 refills | Status: DC

## 2018-07-03 MED ORDER — CLOPIDOGREL BISULFATE 75 MG PO TABS: 75.0000 mg | ORAL_TABLET | Freq: Every day | ORAL | 1 refills | Status: DC

## 2018-07-03 NOTE — Telephone Encounter (Signed)
Copied from [place] [phone]. Topic: Quick Communication - Rx Refill/Question >> Jul 03, 2018  3:42 PM Bea Graff, NT wrote: Medication: clopidogrel (PLAVIX) 75 MG tablet, metFORMIN (GLUCOPHAGE) 500 MG tablet, losartan (COZAAR) 100 MG tablet, carvedilol (COREG) 6.25 MG tablet and simvastatin (ZOCOR) 40 MG tablet   Has the patient contacted their pharmacy? Yes.   (Agent: If no, request that the patient contact the pharmacy for the refill.) (Agent: If yes, when and what did the pharmacy advise?)  Preferred Pharmacy (with phone number or street name): [place], [place] [phone] (Phone) [phone] (Fax)    Agent: Please be advised that RX refills may take up to 3 business days. We ask that you follow-up with your pharmacy.

## 2018-07-06 ENCOUNTER — Other Ambulatory Visit: Payer: Self-pay | Admitting: Family Medicine

## 2018-07-06 DIAGNOSIS — E1140 Type 2 diabetes mellitus with diabetic neuropathy, unspecified: Secondary | ICD-10-CM

## 2018-07-06 MED ORDER — GABAPENTIN 100 MG PO CAPS: 100.0000 mg | ORAL_CAPSULE | Freq: Three times a day (TID) | ORAL | 0 refills | Status: DC

## 2018-07-06 NOTE — Telephone Encounter (Signed)
Rx done. 

## 2018-07-06 NOTE — Telephone Encounter (Signed)
ok 

## 2018-07-06 NOTE — Telephone Encounter (Signed)
Copied from [place] [phone]. Topic: Quick Communication - Rx Refill/Question >> Jul 06, 2018 10:01 AM Sheran Luz wrote: Medication: gabapentin (NEURONTIN) 100 MG capsule  Patient is requesting a refill of this medication.   Preferred Pharmacy (with phone number or street name): [facility] [place], [address] AT [address]

## 2018-07-06 NOTE — Telephone Encounter (Signed)
Pt LOV visit was 01/07/2018 and last refill was done on 02/24/2018, Please advise if ok to refill

## 2018-07-08 ENCOUNTER — Other Ambulatory Visit: Payer: Self-pay

## 2018-07-08 DIAGNOSIS — C50211 Malignant neoplasm of upper-inner quadrant of right female breast: Secondary | ICD-10-CM

## 2018-07-08 DIAGNOSIS — Z170 Estrogen receptor positive status [ER+]: Principal | ICD-10-CM

## 2018-07-08 MED ORDER — ANASTROZOLE 1 MG PO TABS: 1.0000 mg | ORAL_TABLET | Freq: Every day | ORAL | 0 refills | Status: DC

## 2018-07-08 MED ORDER — ANASTROZOLE 1 MG PO TABS: 1.0000 mg | ORAL_TABLET | Freq: Every day | ORAL | 1 refills | Status: DC

## 2018-07-08 NOTE — Telephone Encounter (Signed)
Patient called requesting Anastrozole be sent into [employer] quantity of 10 as she is completely out while she waits on the 90 day supply from mail order.  This has been done and patient was notified.

## 2018-07-20 ENCOUNTER — Telehealth: Payer: Self-pay

## 2018-07-20 NOTE — Telephone Encounter (Signed)
Faxed [employer] urgent request regarding lost medication

## 2018-07-20 NOTE — Telephone Encounter (Signed)
Patient calls stating she lost her entire bottle (90 days supply) of Anastrozole.  She is asking for our help.  I explained I will send [employer] a fax but unsure what they can do.  She verbalized an understanding.

## 2018-09-11 ENCOUNTER — Telehealth: Payer: Self-pay | Admitting: Family Medicine

## 2018-09-11 NOTE — Telephone Encounter (Signed)
Ok to increase gabapentin to 300 mg qhs.  Advise pt med may make her sleepy.

## 2018-09-11 NOTE — Telephone Encounter (Signed)
Please Advise

## 2018-09-11 NOTE — Telephone Encounter (Signed)
Copied from [place] [phone]. Topic: Quick Communication - Rx Refill/Question >> Sep 11, 2018 10:22 AM Rayann Heman wrote: Medication: gabapentin (NEURONTIN) 100 MG capsule [[id number]]  pt called and stated that this medication is not working and you would like to know if the MG can be increased to 300. Please advise

## 2018-09-14 ENCOUNTER — Other Ambulatory Visit: Payer: Self-pay

## 2018-09-14 DIAGNOSIS — E1140 Type 2 diabetes mellitus with diabetic neuropathy, unspecified: Secondary | ICD-10-CM

## 2018-09-14 MED ORDER — GABAPENTIN 300 MG PO CAPS: 300.0000 mg | ORAL_CAPSULE | Freq: Every day | ORAL | 0 refills | Status: DC

## 2018-09-14 NOTE — Telephone Encounter (Signed)
Rx sent to pt pharmacy, tried to call pt and her  phone number listed on her charts says its not in service

## 2018-09-30 ENCOUNTER — Telehealth: Payer: Self-pay | Admitting: *Deleted

## 2018-09-30 ENCOUNTER — Telehealth: Payer: Self-pay | Admitting: Hematology

## 2018-09-30 NOTE — Telephone Encounter (Signed)
Cancelled lab and changed appt to phone visit per sch msg. Called and left msg.

## 2018-09-30 NOTE — Telephone Encounter (Signed)
Pt called earlier today & asked if her appt is still on for the 11th.  She states she will need to plan transportation.  Discussed with DR Burr Medico & offered pt phone appt.  She agrees to this.  Message sent to schedulers to cancel lab appt & r/s office visit to phone visit per DR [facility] request.

## 2018-10-05 NOTE — Progress Notes (Signed)
[facility]   Telephone:([phone] Fax:(336) [phone]   Clinic Follow up Note   Patient Care Team: [name], MD as PCP - General (Family Medicine)   I connected with [name] on 10/08/2018 at  1:00 PM EDT by telephone visit and verified that I am speaking with the correct person using two identifiers.  I discussed the limitations, risks, security and privacy concerns of performing an evaluation and management service by telephone and the availability of in person appointments. I also discussed with the patient that there may be a patient responsible charge related to this service. The patient expressed understanding and agreed to proceed.  Patient's location:  Her home  Provider's location:  My office   CHIEF COMPLAINT: F/u of right breast cancer  SUMMARY OF ONCOLOGIC HISTORY: Oncology History Overview Note  Cancer Staging Breast cancer of upper-inner quadrant of right female breast [facility]) Staging form: Breast, AJCC 8th Edition - Pathologic stage from 08/02/2014: Stage IA (pT1c, pN0, cM0, G2, ER: Positive, PR: Positive, HER2: Positive) - Signed by Truitt Merle, MD on 01/04/2017 - Clinical: No stage assigned - Unsigned     Breast cancer of upper-inner quadrant of right female breast ([place])  05/25/2014 Mammogram   Suspicious lesion at 3 o'clock position in the right breast   06/15/2014 Breast US   Suspicious nodule, a spiculated hypoechoic mass, 1.1 cm at the 1 o'clock position   06/30/2014 Initial Biopsy   US Guided Core Biopsy: Invasive Ductal Carcinoma, Nottingham Grade 2. Addendum: ER positive (100%), PR positive (75-80%), HER2 positive    06/30/2014 Initial Diagnosis   Breast cancer of upper-inner quadrant of right female breast ([place]) from Right Breast Lumpectomy: Invasive Ductal Carcinoma, Elston Grade 2, surgical margins free of carcinoma; Sentinel nodes 2/2 free of tumor   08/02/2014 Surgery   Right Breast Lumpectomy with SLN biopsy; Attending physician Dr.  Tamera Punt at [facility] in [place], [place]   08/02/2014 Pathology Results   Right breast lumpectomy: Invasive ductal carcinoma, Duanne Moron Grade 2; size or largest invasive carcinoma 1.4 cm; Sentinel lymph nodes 2/2 free of tumor, margins free of tumor    09/22/2014 Echocardiogram   MUGA scan: normal ejection fraction of 57.7%   10/01/2014 - 10/03/2015 Adjuvant Chemotherapy   Adjuvant weekly Taxol from 12/05/39 to 10/02/04 complicated by fatigue, diarrhea, and leukopenia requiring treatment delays and dosage adjustments. Records indicate she received 8 of 12 doses. Completed adjuvant Herceptin q3 weeks from 10/01/14 to 10/03/15.    06/12/2015 - 08/04/2015 Radiation Therapy   Received 4,860 cGy to right breast in 27 fractions Received 1,600 cGy boost in 8 fractions    09/12/2015 -  Anti-estrogen oral therapy   She began Arimidex after completing adjuvant radiation   06/25/2016 Imaging   CT Chest, Abdomen, Pelvis with contrast, compared with PET/CT on 04/25/2015 and CT 09/06/2014 - Impression: Chest: 1. Unchanged multiple subcentimeter pulmonary nodules bilaterally.there are no new pulmonary nodules. 2. No thoracic lymphadenopathy. 3. New right lung post radiation changes. 4. New diffuse right breast skin thickening suggestive of post radiation changes. Abdomen/pelvis 1. No new mass or lymphadenopathy in the abdomen and pelvis, no CT evidence of metastatic disease. 2. Unchanged adjacent soft tissue nodules inferior to the right hepatic lobe. 3. Colonic diverticulosis. 4. Status post hysterectomy.   06/29/2016 Mammogram   Digital bilateral mammogram with CAD and TOMO findings: the breast parenchyma demonstrates scattered fibroglandular densities. There are no suspicious calcifications, masses, architectural distortion or skin thickening. Surgical clips are  present in the right breast at posterior depth. Vascular calcifications are present. No mammographic evidence of malignancy or referred is is  gravida   12/23/2017 Mammogram   12/23/2017 Mammogram IMPRESSION: No mammographic evidence of malignancy in either breast, status post right lumpectomy.      CURRENT THERAPY:  Anastrozole 37m daily starting 09/12/15  INTERVAL HISTORY:  RLucyann Romanois here for a follow up of right breast cancer. She was able to identify herself by birth date. She notes she is doing well. She is taking anastrozole with no issues. She denies any new issues. 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 She notes her DM and HTN is managed well. She notes her weight is stable. She sees her PCP as needed.   REVIEW OF SYSTEMS:   Constitutional: Denies fevers, chills or abnormal weight loss Eyes: Denies blurriness of vision Ears, nose, mouth, throat, and face: Denies mucositis or sore throat Respiratory: Denies cough, dyspnea or wheezes Cardiovascular: Denies palpitation, chest discomfort or lower extremity swelling Gastrointestinal:  Denies nausea, heartburn or change in bowel habits Skin: Denies abnormal skin rashes MSK: (+) arthritis in knees  Lymphatics: Denies new lymphadenopathy or easy bruising Neurological:Denies numbness, tingling or new weaknesses Behavioral/Psych: Mood is stable, no new changes  All other systems were reviewed with the patient and are negative.  MEDICAL HISTORY:  Past Medical History:  Diagnosis Date   Arthritis    Breast cancer ([place]    Colon polyps    Diabetes mellitus without complication ([place]    GI bleed    Hyperlipidemia    Hypertension    Personal history of chemotherapy    Personal history of radiation therapy    Stroke ([employer]     SURGICAL HISTORY: Past Surgical History:  Procedure Laterality Date   ABDOMINAL HYSTERECTOMY     30 years ago   BREAST BIOPSY     BREAST LUMPECTOMY Right 08/02/2014   IR REMOVAL TUN ACCESS W/ PORT W/O FL MOD SED  11/04/2017    I have reviewed the social history and family history with the patient and they are unchanged  from previous note.  ALLERGIES:  has No Known Allergies.  MEDICATIONS:  Current Outpatient Medications  Medication Sig Dispense Refill   anastrozole (ARIMIDEX) 1 MG tablet Take 1 tablet (1 mg total) by mouth daily. 90 tablet 3   carvedilol (COREG) 6.25 MG tablet Take 1 tablet (6.25 mg total) by mouth 2 (two) times daily with a meal. 180 tablet 1   clopidogrel (PLAVIX) 75 MG tablet Take 1 tablet (75 mg total) by mouth daily. 90 tablet 1   gabapentin (NEURONTIN) 300 MG capsule Take 1 capsule (300 mg total) by mouth at bedtime. 60 capsule 0   glucose blood (ONETOUCH VERIO) test strip Patient is to check blood sugar 1-2 times daily. Dx. E11.9 100 each 12   losartan (COZAAR) 100 MG tablet Take 1 tablet (100 mg total) by mouth daily. 90 tablet 1   metFORMIN (GLUCOPHAGE) 500 MG tablet Take 1 tablet (500 mg total) by mouth daily with supper. 90 tablet 1   nystatin (MYCOSTATIN) 100000 UNIT/ML suspension Take 5 mLs (500,000 Units total) by mouth 4 (four) times daily. 60 mL 0   Nystatin POWD 1 application by Does not apply route 2 (two) times daily. 1[place] is to check blood sugar 1-2 times daily. 100 each 11   simvastatin (ZOCOR) 40 MG tablet Take 1 tablet (40 mg total)  by mouth every evening. 90 tablet 1   Current Facility-Administered Medications  Medication Dose Route Frequency Provider Last Rate Last Dose   0.9 %  sodium chloride infusion  500 mL Intravenous Once Irene Shipper, MD        PHYSICAL EXAMINATION: ECOG PERFORMANCE STATUS: 1 - Symptomatic but completely ambulatory  No vitals taken today, Exam not performed today   LABORATORY DATA:  I have reviewed the data as listed CBC Latest Ref Rng & Units 04/09/2018 11/04/2017 10/08/2017  WBC 4.0 - 10.5 K/uL 6.0 5.0 5.1  Hemoglobin 12.0 - 15.0 g/dL 12.3 11.9(L) 11.3(L)  Hematocrit 36.0 - 46.0 % 39.9 38.9 36.6  Platelets 150 - 400 K/uL 202 241 237     CMP Latest Ref Rng & Units  04/09/2018 10/08/2017 01/02/2017  Glucose 70 - 99 mg/dL 180(H) 164(H) 118  BUN 8 - 23 mg/dL 19 21 14.2  Creatinine 0.44 - 1.00 mg/dL 1.35(H) 1.25(H) 1.1  Sodium 135 - 145 mmol/L 142 139 138  Potassium 3.5 - 5.1 mmol/L 4.8 4.6 4.3  Chloride 98 - 111 mmol/L 106 105 -  CO2 22 - 32 mmol/L _0 Calcium 8.9 - 10.3 mg/dL 9.9 9.8 9.8  Total Protein 6.5 - 8.1 g/dL 7.4 7.1 7.4  Total Bilirubin 0.3 - 1.2 mg/dL 0.4 0.3 0.34  Alkaline Phos 38 - 126 U/L 86 93 82  AST 15 - 41 U/L 12(L) 12 15  ALT 0 - 44 U/L _1 RADIOGRAPHIC STUDIES: I have personally reviewed the radiological images as listed and agreed with the findings in the report. No results found.   ASSESSMENT & PLAN:  Karesha Trzcinski is a 78 y.o. female with   1. 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 Treated with right lumpectomy, chemo and radiation. Currently on anastrozole, started in 08/2015. Tolerating well. -She is clinically doing well. 11/2017 mammogram unremarkable. There is no clinical concern for recurrence. -Continue surveillance. Next mammogram in August or September  -Continue anastrozole  -F/u in 4 months    2. Bone Health -I previously encouraged her to take Calcium and Vitamin D. -She is overdue her 2019 DEXA scan. Will discuss at next visit.  -I educated her about how anastrozole can reduce bone density. I encouraged her to take Vitamin D    3. HTN,DM -f/u with PCP -Overall stable  -Hb A1c at 8.6 on 05/08/18. This is still high better improved from last year.   4. CKD  -She has developed mild renal insufficiency, probably related to diabetes and hypertension -I encouraged her to drink fluids adequately, and follow-up with her PCP -Stable.   Plan  -She is clinically doing well  -continue Anastrozole -Mammogram in 11/2018 -Lab and f/u in 4 months     No problem-specific Assessment & Plan notes  found for this encounter.   Orders Placed This Encounter  Procedures   MM DIAG BREAST TOMO BILATERAL    Standing Status:   Future    Standing Expiration Date:   10/08/2019    Order Specific Question:   Reason for Exam (SYMPTOM  OR DIAGNOSIS REQUIRED)    Answer:   screening    Order Specific Question:   Preferred imaging location?    Answer:   [facility]   I discussed the assessment and treatment plan with the patient. The patient was provided an opportunity to ask questions  and all were answered. The patient agreed with the plan and demonstrated an understanding of the instructions.  The patient was advised to call back or seek an in-person evaluation if the symptoms worsen or if the condition fails to improve as anticipated.  I provided 15 minutes of non face-to-face telephone visit time during this encounter, and > 50% was spent counseling as documented under my assessment & plan.    Truitt Merle, MD 10/08/2018   I, Joslyn Devon, am acting as scribe for Truitt Merle, MD.   I have reviewed the above documentation for accuracy and completeness, and I agree with the above.

## 2018-10-08 ENCOUNTER — Encounter: Payer: Self-pay | Admitting: Hematology

## 2018-10-08 ENCOUNTER — Other Ambulatory Visit: Payer: Medicare HMO

## 2018-10-08 ENCOUNTER — Inpatient Hospital Stay: Payer: Medicare HMO | Attending: Hematology | Admitting: Hematology

## 2018-10-08 DIAGNOSIS — C50211 Malignant neoplasm of upper-inner quadrant of right female breast: Secondary | ICD-10-CM

## 2018-10-08 DIAGNOSIS — N189 Chronic kidney disease, unspecified: Secondary | ICD-10-CM

## 2018-10-08 DIAGNOSIS — Z923 Personal history of irradiation: Secondary | ICD-10-CM | POA: Diagnosis not present

## 2018-10-08 DIAGNOSIS — E119 Type 2 diabetes mellitus without complications: Secondary | ICD-10-CM | POA: Diagnosis not present

## 2018-10-08 DIAGNOSIS — Z9221 Personal history of antineoplastic chemotherapy: Secondary | ICD-10-CM | POA: Diagnosis not present

## 2018-10-08 DIAGNOSIS — Z79811 Long term (current) use of aromatase inhibitors: Secondary | ICD-10-CM | POA: Diagnosis not present

## 2018-10-08 DIAGNOSIS — Z170 Estrogen receptor positive status [ER+]: Secondary | ICD-10-CM

## 2018-10-08 DIAGNOSIS — Z90710 Acquired absence of both cervix and uterus: Secondary | ICD-10-CM

## 2018-10-08 DIAGNOSIS — Z79899 Other long term (current) drug therapy: Secondary | ICD-10-CM

## 2018-10-08 DIAGNOSIS — I10 Essential (primary) hypertension: Secondary | ICD-10-CM

## 2018-10-08 MED ORDER — ANASTROZOLE 1 MG PO TABS: 1.0000 mg | ORAL_TABLET | Freq: Every day | ORAL | 3 refills | Status: DC

## 2018-10-09 ENCOUNTER — Telehealth: Payer: Self-pay | Admitting: Hematology

## 2018-10-09 NOTE — Telephone Encounter (Signed)
Scheduled appt per 6/11 los. A calendar will be mailed out. °

## 2018-11-23 ENCOUNTER — Other Ambulatory Visit: Payer: Self-pay | Admitting: Family Medicine

## 2018-11-23 DIAGNOSIS — E1140 Type 2 diabetes mellitus with diabetic neuropathy, unspecified: Secondary | ICD-10-CM

## 2018-11-23 NOTE — Telephone Encounter (Signed)
Dr. Banks please advise  

## 2018-11-25 ENCOUNTER — Telehealth: Payer: Self-pay | Admitting: Family Medicine

## 2018-11-25 NOTE — Telephone Encounter (Signed)
Medication Refill - Medication: gabapentin (NEURONTIN) 300 MG capsule    Preferred Pharmacy (with phone number or street name):  [facility] [phone] - [place], [address] AT [address] [phone] (Phone) [phone] (Fax)

## 2018-11-25 NOTE — Telephone Encounter (Signed)
Dr. Banks please advise  

## 2018-12-02 ENCOUNTER — Other Ambulatory Visit: Payer: Self-pay | Admitting: Family Medicine

## 2018-12-02 DIAGNOSIS — E1140 Type 2 diabetes mellitus with diabetic neuropathy, unspecified: Secondary | ICD-10-CM

## 2018-12-02 MED ORDER — GABAPENTIN 300 MG PO CAPS: 300.0000 mg | ORAL_CAPSULE | Freq: Every day | ORAL | 3 refills | Status: DC

## 2018-12-02 NOTE — Telephone Encounter (Signed)
Refill sent it.

## 2018-12-21 ENCOUNTER — Other Ambulatory Visit: Payer: Self-pay | Admitting: Family Medicine

## 2018-12-21 DIAGNOSIS — E119 Type 2 diabetes mellitus without complications: Secondary | ICD-10-CM

## 2018-12-21 DIAGNOSIS — I10 Essential (primary) hypertension: Secondary | ICD-10-CM

## 2018-12-21 DIAGNOSIS — Z8673 Personal history of transient ischemic attack (TIA), and cerebral infarction without residual deficits: Secondary | ICD-10-CM

## 2018-12-25 ENCOUNTER — Other Ambulatory Visit: Payer: Self-pay

## 2018-12-25 ENCOUNTER — Ambulatory Visit
Admission: RE | Admit: 2018-12-25 | Discharge: 2018-12-25 | Disposition: A | Discharge status: Home / Self Care | Payer: Medicare HMO | Source: Ambulatory Visit | Attending: Hematology | Admitting: Hematology

## 2018-12-25 DIAGNOSIS — Z170 Estrogen receptor positive status [ER+]: Secondary | ICD-10-CM

## 2018-12-25 DIAGNOSIS — C50211 Malignant neoplasm of upper-inner quadrant of right female breast: Secondary | ICD-10-CM

## 2018-12-25 DIAGNOSIS — R928 Other abnormal and inconclusive findings on diagnostic imaging of breast: Secondary | ICD-10-CM | POA: Diagnosis not present

## 2018-12-25 LAB — HM MAMMOGRAPHY

## 2019-01-01 ENCOUNTER — Encounter: Payer: Self-pay | Admitting: Family Medicine

## 2019-01-07 ENCOUNTER — Other Ambulatory Visit: Payer: Self-pay

## 2019-01-07 ENCOUNTER — Telehealth (INDEPENDENT_AMBULATORY_CARE_PROVIDER_SITE_OTHER): Payer: 59 | Admitting: Family Medicine

## 2019-01-07 DIAGNOSIS — E1140 Type 2 diabetes mellitus with diabetic neuropathy, unspecified: Secondary | ICD-10-CM | POA: Diagnosis not present

## 2019-01-07 NOTE — Progress Notes (Signed)
Virtual Visit via Telephone Note  I connected with Jasmine Carlson on 01/07/19 at  2:00 PM EDT by telephone and verified that I am speaking with the correct person using two identifiers.   I discussed the limitations, risks, security and privacy concerns of performing an evaluation and management service by telephone and the availability of in person appointments. I also discussed with the patient that there may be a patient responsible charge related to this service. The patient expressed understanding and agreed to proceed.  Location patient: home Location provider: work or home office Participants present for the call: patient, provider Patient did not have a visit in the prior 7 days to address this/these issue(s).   History of Present Illness: Pt contacted for f/u.  States she is doing well.  Pt states she got a new insurance ([employer]), no longer using [employer].  States has not checked bs this wk as she was in [place] visiting family, but bs has been "fine".    Pt inquires if this provider is in clinic closer to town. States Jasmine Carlson is out of the way.   Observations/Objective: Patient sounds cheerful and well on the phone. I do not appreciate any SOB. Speech and thought processing are grossly intact. Patient reported vitals:  Assessment and Plan: Type 2 diabetes mellitus with diabetic neuropathy, without long-term current use of insulin (Jasmine Carlson) -continue lifestyle modifications -continue Metformin 500 mg with dinner -pt encouraged to check fsbs -f/u in the next few months -consider influenza vaccine  Pt advised this provider is only at [place] clinic.  Will give pt info on clinics closer to her.   Follow Up Instructions:   I did not refer this patient for an OV in the next 24 hours for this/these issue(s).  I discussed the assessment and treatment plan with the patient. The patient was provided an opportunity to ask questions and all were answered.  The patient agreed with the plan and demonstrated an understanding of the instructions.   The patient was advised to call back or seek an in-person evaluation if the symptoms worsen or if the condition fails to improve as anticipated.  I provided 6 minutes of non-face-to-face time during this encounter.   [name], MD

## 2019-01-29 ENCOUNTER — Telehealth: Payer: Self-pay | Admitting: Hematology

## 2019-01-29 NOTE — Telephone Encounter (Signed)
R/s appt per 10/1 sch message- pt aware of appt date and time

## 2019-02-08 ENCOUNTER — Ambulatory Visit: Payer: Medicare HMO | Admitting: Hematology

## 2019-02-08 ENCOUNTER — Other Ambulatory Visit: Payer: Medicare HMO

## 2019-02-08 ENCOUNTER — Inpatient Hospital Stay (HOSPITAL_BASED_OUTPATIENT_CLINIC_OR_DEPARTMENT_OTHER): Payer: Medicare Other | Admitting: Nurse Practitioner

## 2019-02-08 ENCOUNTER — Encounter: Payer: Self-pay | Admitting: Nurse Practitioner

## 2019-02-08 ENCOUNTER — Inpatient Hospital Stay: Payer: Medicare Other | Attending: Nurse Practitioner

## 2019-02-08 ENCOUNTER — Other Ambulatory Visit: Payer: Self-pay

## 2019-02-08 VITALS — BP 179/79 | HR 71 | Temp 98.5°F | Resp 20 | Ht 63.0 in | Wt 171.0 lb

## 2019-02-08 DIAGNOSIS — M1990 Unspecified osteoarthritis, unspecified site: Secondary | ICD-10-CM | POA: Insufficient documentation

## 2019-02-08 DIAGNOSIS — E1122 Type 2 diabetes mellitus with diabetic chronic kidney disease: Secondary | ICD-10-CM | POA: Insufficient documentation

## 2019-02-08 DIAGNOSIS — Z79899 Other long term (current) drug therapy: Secondary | ICD-10-CM | POA: Insufficient documentation

## 2019-02-08 DIAGNOSIS — I129 Hypertensive chronic kidney disease with stage 1 through stage 4 chronic kidney disease, or unspecified chronic kidney disease: Secondary | ICD-10-CM | POA: Insufficient documentation

## 2019-02-08 DIAGNOSIS — Z7984 Long term (current) use of oral hypoglycemic drugs: Secondary | ICD-10-CM | POA: Insufficient documentation

## 2019-02-08 DIAGNOSIS — Z170 Estrogen receptor positive status [ER+]: Secondary | ICD-10-CM | POA: Insufficient documentation

## 2019-02-08 DIAGNOSIS — N189 Chronic kidney disease, unspecified: Secondary | ICD-10-CM | POA: Diagnosis not present

## 2019-02-08 DIAGNOSIS — C50911 Malignant neoplasm of unspecified site of right female breast: Secondary | ICD-10-CM

## 2019-02-08 DIAGNOSIS — Z8673 Personal history of transient ischemic attack (TIA), and cerebral infarction without residual deficits: Secondary | ICD-10-CM | POA: Diagnosis not present

## 2019-02-08 DIAGNOSIS — E2839 Other primary ovarian failure: Secondary | ICD-10-CM | POA: Diagnosis not present

## 2019-02-08 DIAGNOSIS — Z9221 Personal history of antineoplastic chemotherapy: Secondary | ICD-10-CM | POA: Insufficient documentation

## 2019-02-08 DIAGNOSIS — Z923 Personal history of irradiation: Secondary | ICD-10-CM | POA: Insufficient documentation

## 2019-02-08 DIAGNOSIS — Z79811 Long term (current) use of aromatase inhibitors: Secondary | ICD-10-CM | POA: Insufficient documentation

## 2019-02-08 DIAGNOSIS — C50211 Malignant neoplasm of upper-inner quadrant of right female breast: Secondary | ICD-10-CM

## 2019-02-08 DIAGNOSIS — R6889 Other general symptoms and signs: Secondary | ICD-10-CM | POA: Diagnosis not present

## 2019-02-08 DIAGNOSIS — E785 Hyperlipidemia, unspecified: Secondary | ICD-10-CM | POA: Insufficient documentation

## 2019-02-08 LAB — COMPREHENSIVE METABOLIC PANEL
ALT: 12 U/L (ref 0–44)
AST: 14 U/L — ABNORMAL LOW (ref 15–41)
Albumin: 3.7 g/dL (ref 3.5–5.0)
Alkaline Phosphatase: 81 U/L (ref 38–126)
Anion gap: 9 (ref 5–15)
BUN: 21 mg/dL (ref 8–23)
CO2: 27 mmol/L (ref 22–32)
Calcium: 9.7 mg/dL (ref 8.9–10.3)
Chloride: 102 mmol/L (ref 98–111)
Creatinine, Ser: 1.34 mg/dL — ABNORMAL HIGH (ref 0.44–1.00)
GFR calc Af Amer: 44 mL/min — ABNORMAL LOW (ref >60)
GFR calc non Af Amer: 38 mL/min — ABNORMAL LOW (ref >60)
Glucose, Bld: 212 mg/dL — ABNORMAL HIGH (ref 70–99)
Potassium: 4.3 mmol/L (ref 3.5–5.1)
Sodium: 138 mmol/L (ref 135–145)
Total Bilirubin: 0.4 mg/dL (ref 0.3–1.2)
Total Protein: 7.4 g/dL (ref 6.5–8.1)

## 2019-02-08 LAB — CBC WITH DIFFERENTIAL/PLATELET
Abs Immature Granulocytes: 0.03 10*3/uL (ref 0.00–0.07)
Basophils Absolute: 0 10*3/uL (ref 0.0–0.1)
Basophils Relative: 0 %
Eosinophils Absolute: 0.1 10*3/uL (ref 0.0–0.5)
Eosinophils Relative: 2 %
HCT: 40.4 % (ref 36.0–46.0)
Hemoglobin: 12.6 g/dL (ref 12.0–15.0)
Immature Granulocytes: 0 %
Lymphocytes Relative: 23 %
Lymphs Abs: 1.6 10*3/uL (ref 0.7–4.0)
MCH: 26.8 pg (ref 26.0–34.0)
MCHC: 31.2 g/dL (ref 30.0–36.0)
MCV: 86 fL (ref 80.0–100.0)
Monocytes Absolute: 0.7 10*3/uL (ref 0.1–1.0)
Monocytes Relative: 10 %
Neutro Abs: 4.4 10*3/uL (ref 1.7–7.7)
Neutrophils Relative %: 65 %
Platelets: 191 10*3/uL (ref 150–400)
RBC: 4.7 MIL/uL (ref 3.87–5.11)
RDW: 13.4 % (ref 11.5–15.5)
WBC: 6.8 10*3/uL (ref 4.0–10.5)
nRBC: 0 % (ref 0.0–0.2)

## 2019-02-08 MED ORDER — ANASTROZOLE 1 MG PO TABS: 1.0000 mg | ORAL_TABLET | Freq: Every day | ORAL | 3 refills | Status: DC

## 2019-02-08 NOTE — Progress Notes (Signed)
[facility]   Telephone:(336) [phone] Fax:(336) [phone]   Clinic Follow up Note   Patient Care Team: Jasmine Ruddy, MD as PCP - General (Family Medicine) 02/08/2019  CHIEF COMPLAINT: F/u right breast cancer    SUMMARY OF ONCOLOGIC HISTORY: Oncology History Overview Note  Cancer Staging Breast cancer of upper-inner quadrant of right female breast [facility]) Staging form: Breast, AJCC 8th Edition - Pathologic stage from 08/02/2014: Stage IA (pT1c, pN0, cM0, G2, ER: Positive, PR: Positive, HER2: Positive) - Signed by Jasmine Merle, MD on 01/04/2017 - Clinical: No stage assigned - Unsigned     Breast cancer of upper-inner quadrant of right female breast (Jasmine Carlson)  05/25/2014 Mammogram   Suspicious lesion at 3 o'clock position in the right breast   06/15/2014 Breast US   Suspicious nodule, a spiculated hypoechoic mass, 1.1 cm at the 1 o'clock position   06/30/2014 Initial Biopsy   US Guided Core Biopsy: Invasive Ductal Carcinoma, Nottingham Grade 2. Addendum: ER positive (100%), PR positive (75-80%), HER2 positive    06/30/2014 Initial Diagnosis   Breast cancer of upper-inner quadrant of right female breast (Jasmine Carlson) from Right Breast Lumpectomy: Invasive Ductal Carcinoma, Elston Grade 2, surgical margins free of carcinoma; Sentinel nodes 2/2 free of tumor   08/02/2014 Surgery   Right Breast Lumpectomy with SLN biopsy; Attending physician Dr. Tamera [facility] at [facility] in [place], [place]   08/02/2014 Pathology Results   Right breast lumpectomy: Invasive ductal carcinoma, Jasmine Carlson Grade 2; size or largest invasive carcinoma 1.4 cm; Sentinel lymph nodes 2/2 free of tumor, margins free of tumor    09/22/2014 Echocardiogram   MUGA scan: normal ejection fraction of 57.7%   10/01/2014 - 10/03/2015 Adjuvant Chemotherapy   Adjuvant weekly Taxol from 07/31/01 to 08/03/40 complicated by fatigue, diarrhea, and leukopenia requiring treatment delays and dosage adjustments. Records indicate she  received 8 of 12 doses. Completed adjuvant Herceptin q3 weeks from 10/01/14 to 10/03/15.    06/12/2015 - 08/04/2015 Radiation Therapy   Received 4,860 cGy to right breast in 27 fractions Received 1,600 cGy boost in 8 fractions    09/12/2015 -  Anti-estrogen oral therapy   She began Arimidex after completing adjuvant radiation   06/25/2016 Imaging   CT Chest, Abdomen, Pelvis with contrast, compared with PET/CT on 04/25/2015 and CT 09/06/2014 - Impression: Chest: 1. Unchanged multiple subcentimeter pulmonary nodules bilaterally.there are no new pulmonary nodules. 2. No thoracic lymphadenopathy. 3. New right lung post radiation changes. 4. New diffuse right breast skin thickening suggestive of post radiation changes. Abdomen/pelvis 1. No new mass or lymphadenopathy in the abdomen and pelvis, no CT evidence of metastatic disease. 2. Unchanged adjacent soft tissue nodules inferior to the right hepatic lobe. 3. Colonic diverticulosis. 4. Status post hysterectomy.   06/29/2016 Mammogram   Digital bilateral mammogram with CAD and TOMO findings: the breast parenchyma demonstrates scattered fibroglandular densities. There are no suspicious calcifications, masses, architectural distortion or skin thickening. Surgical clips are present in the right breast at posterior depth. Vascular calcifications are present. No mammographic evidence of malignancy or referred is is gravida   12/23/2017 Mammogram   12/23/2017 Mammogram IMPRESSION: No mammographic evidence of malignancy in either breast, status post right lumpectomy.   12/25/2018 Mammogram   1. No evidence of new or recurrent breast carcinoma. 2. Benign postsurgical changes on the right.     CURRENT THERAPY: Anastrozole 34m daily starting 09/12/15  INTERVAL HISTORY: Ms. LZillreturns for f/u as scheduled. She had a telehealth visit with Dr.  Feng in 09/2018. She had a negative mammogram on 12/25/18. She is overdue for DEXA scan. She feels well today. BP  varies, she does not check it at home. She continues anastrozole. Has mild knee aches, she can not exercise much but is otherwise functional. Denies hot flashes. Mood is stable. Denies bleeding. No change in bowel habits. No concerns with breasts such as new mass or nipple change. No recent fever, chills, cough, chest pain, dyspnea, or leg edema.    MEDICAL HISTORY:  Past Medical History:  Diagnosis Date  . Arthritis   . Breast cancer ([place])   . Colon polyps   . Diabetes mellitus without complication ([place])   . GI bleed   . Hyperlipidemia   . Hypertension   . Personal history of chemotherapy   . Personal history of radiation therapy   . Stroke [facility])     SURGICAL HISTORY: Past Surgical History:  Procedure Laterality Date  . ABDOMINAL HYSTERECTOMY     30 years ago  . BREAST BIOPSY    . BREAST LUMPECTOMY Right 08/02/2014  . IR REMOVAL TUN ACCESS W/ PORT W/O FL MOD SED  11/04/2017    I have reviewed the social history and family history with the patient and they are unchanged from previous note.  ALLERGIES:  has No Known Allergies.  MEDICATIONS:  Current Outpatient Medications  Medication Sig Dispense Refill  . anastrozole (ARIMIDEX) 1 MG tablet Take 1 tablet (1 mg total) by mouth daily. 90 tablet 3  . carvedilol (COREG) 6.25 MG tablet TAKE 1 TABLET TWICE DAILY WITH MEALS 180 tablet 1  . clopidogrel (PLAVIX) 75 MG tablet TAKE 1 TABLET EVERY DAY 90 tablet 1  . glucose blood (ONETOUCH VERIO) test strip Patient is to check blood sugar 1-2 times daily. Dx. E11.9 100 each 12  . losartan (COZAAR) 100 MG tablet TAKE 1 TABLET EVERY DAY 90 tablet 1  . metFORMIN (GLUCOPHAGE) 500 MG tablet TAKE 1 TABLET  DAILY WITH SUPPER. 90 tablet 1  . nystatin (MYCOSTATIN) 100000 UNIT/ML suspension Take 5 mLs (500,000 Units total) by mouth 4 (four) times daily. (Patient not taking: Reported on 02/08/2019) 60 mL 0  . Nystatin POWD 1 application by Does not apply route 2 (two) times daily. (Patient not  taking: Reported on 02/08/2019) 1 Bottle 0  . [employer] Patient is to check blood sugar 1-2 times daily. 100 each 11  . simvastatin (ZOCOR) 40 MG tablet TAKE 1 TABLET EVERY EVENING 90 tablet 1   Current Facility-Administered Medications  Medication Dose Route Frequency Provider Last Rate Last Dose  . 0.9 %  sodium chloride infusion  500 mL Intravenous Once Irene Shipper, MD        PHYSICAL EXAMINATION: ECOG PERFORMANCE STATUS: 0 - Asymptomatic  Vitals:   02/08/19 1528  BP: (!) 179/79  Pulse: 71  Resp: 20  Temp: 98.5 F (36.9 C)  SpO2: 100%   Filed Weights   02/08/19 1528  Weight: 171 lb (77.6 kg)    GENERAL:alert, no distress and comfortable SKIN: no rash  EYES: sclera clear LYMPH:  no palpable cervical or supraclavicular lymphadenopathy  LUNGS: clear, normal breathing effort HEART: regular rate & rhythm, no lower extremity edema Musculoskeletal:no cyanosis of digits  NEURO: alert & oriented x 3 with fluent speech, normal gait Breast exam: s/p right lumpectomy and radiation. No palpable mass in either breast or axilla that I could appreciate   LABORATORY DATA:  I have reviewed the data as  listed CBC Latest Ref Rng & Units 02/08/2019 04/09/2018 11/04/2017  WBC 4.0 - 10.5 K/uL 6.8 6.0 5.0  Hemoglobin 12.0 - 15.0 g/dL 12.6 12.3 11.9(L)  Hematocrit 36.0 - 46.0 % 40.4 39.9 38.9  Platelets 150 - 400 K/uL 191 202 241     CMP Latest Ref Rng & Units 02/08/2019 04/09/2018 10/08/2017  Glucose 70 - 99 mg/dL 212(H) 180(H) 164(H)  BUN 8 - 23 mg/dL '21 19 21  ' Creatinine 0.44 - 1.00 mg/dL 1.34(H) 1.35(H) 1.25(H)  Sodium 135 - 145 mmol/L 138 142 139  Potassium 3.5 - 5.1 mmol/L 4.3 4.8 4.6  Chloride 98 - 111 mmol/L 102 106 105  CO2 22 - 32 mmol/L '27 26 25  ' Calcium 8.9 - 10.3 mg/dL 9.7 9.9 9.8  Total Protein 6.5 - 8.1 g/dL 7.4 7.4 7.1  Total Bilirubin 0.3 - 1.2 mg/dL 0.4 0.4 0.3  Alkaline Phos 38 - 126 U/L 81 86 93  AST 15 - 41 U/L 14(L) 12(L) 12  ALT 0 -  44 U/L '12 7 6      ' RADIOGRAPHIC STUDIES: I have personally reviewed the radiological images as listed and agreed with the findings in the report. No results found.   ASSESSMENT & PLAN: Jasmine Carlson is a 78 y.o. female with   1. 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 Treated with right lumpectomy, chemo and radiation. Currently on anastrozole, started in 08/2015. -11/2018 mammogram unremarkable.  -on surveillance and AI  2. Bone Health -overdue for DEXA, ordered today   3. HTN,DM -f/u with PCP -BG 212 today; BP 179/79 -continue f/u with PCP  -Hb A1c at 8.6 on 05/08/18. This is still high better improved from last year.   4. CKD  -Cr 1.34, related to DM and HTN; I encouraged her to try to tightly control her chronic health issues and hydrate well.  -f/u with PCP   Disposition:  Ms. Herard is clinically doing well. CBC is normal, CMP with elevated BG and Cr, otherwise stable. Breast exam is unremarkable. Mammogram in 11/2018 is negative for malignancy; no clinical concern for recurrence. Continue surveillance. She is tolerating anastrozole well with mild knee pain, no other significant side effects; she will continue. I refilled for her.   She is overdue for DEXA. I recommend she take calcium and vitamin D and do weight bearing exercise as tolerated. I reviewed AI can decrease bone density. She agrees to proceed with DEXA.   She continues f/u with PCP; she declined a flu vaccine today. She is over 4 years from initial diagnosis; she will return for lab and f/u in 6 months.    Orders Placed This Encounter  Procedures  . DG Bone Density    Standing Status:   Future    Standing Expiration Date:   02/08/2020    Order Specific Question:   Reason for Exam (SYMPTOM  OR DIAGNOSIS REQUIRED)    Answer:   postmenopausal, on AI for h/o breast cancer    Order Specific Question:   Preferred  imaging location?    Answer:   [facility]   All questions were answered. The patient knows to call the clinic with any problems, questions or concerns. No barriers to learning was detected.     Alla Feeling, NP 02/08/19

## 2019-02-09 ENCOUNTER — Telehealth: Payer: Self-pay | Admitting: Hematology

## 2019-02-09 NOTE — Telephone Encounter (Signed)
Scheduled appt per 10/12

## 2019-02-09 NOTE — Telephone Encounter (Signed)
Scheduled appt per 10/12 los. ° °Sent a staff message to get a calendar mailed out. °

## 2019-03-11 ENCOUNTER — Telehealth: Payer: Self-pay | Admitting: Family Medicine

## 2019-03-11 NOTE — Telephone Encounter (Signed)
Pt stated she has new insurance and is now using [facility]. All her medications need to start going there. Please advise they will need prescriptions sent over.  [place], [place] [employer] [phone] (Phone) [phone] (Fax)

## 2019-03-12 ENCOUNTER — Other Ambulatory Visit: Payer: Self-pay | Admitting: *Deleted

## 2019-03-12 DIAGNOSIS — I10 Essential (primary) hypertension: Secondary | ICD-10-CM

## 2019-03-12 DIAGNOSIS — E119 Type 2 diabetes mellitus without complications: Secondary | ICD-10-CM

## 2019-03-12 DIAGNOSIS — Z8673 Personal history of transient ischemic attack (TIA), and cerebral infarction without residual deficits: Secondary | ICD-10-CM

## 2019-03-12 MED ORDER — METFORMIN HCL 500 MG PO TABS: ORAL_TABLET | ORAL | 1 refills | Status: DC

## 2019-03-12 MED ORDER — SIMVASTATIN 40 MG PO TABS: 40.0000 mg | ORAL_TABLET | Freq: Every evening | ORAL | 1 refills | Status: DC

## 2019-03-12 MED ORDER — CARVEDILOL 6.25 MG PO TABS: 6.2500 mg | ORAL_TABLET | Freq: Two times a day (BID) | ORAL | 1 refills | Status: DC

## 2019-03-12 MED ORDER — CLOPIDOGREL BISULFATE 75 MG PO TABS: 75.0000 mg | ORAL_TABLET | Freq: Every day | ORAL | 1 refills | Status: DC

## 2019-03-12 MED ORDER — LOSARTAN POTASSIUM 100 MG PO TABS: 100.0000 mg | ORAL_TABLET | Freq: Every day | ORAL | 1 refills | Status: DC

## 2019-03-12 NOTE — Telephone Encounter (Signed)
Rx's refilled and sent to new pharmacy [facility]

## 2019-05-12 ENCOUNTER — Ambulatory Visit
Admission: RE | Admit: 2019-05-12 | Discharge: 2019-05-12 | Disposition: A | Discharge status: Home / Self Care | Payer: Medicare Other | Source: Ambulatory Visit | Attending: Nurse Practitioner | Admitting: Nurse Practitioner

## 2019-05-12 ENCOUNTER — Other Ambulatory Visit: Payer: Self-pay

## 2019-05-12 ENCOUNTER — Encounter: Payer: Self-pay | Admitting: Family Medicine

## 2019-05-12 DIAGNOSIS — Z780 Asymptomatic menopausal state: Secondary | ICD-10-CM | POA: Diagnosis not present

## 2019-05-12 DIAGNOSIS — M810 Age-related osteoporosis without current pathological fracture: Secondary | ICD-10-CM | POA: Insufficient documentation

## 2019-05-12 DIAGNOSIS — E2839 Other primary ovarian failure: Secondary | ICD-10-CM

## 2019-05-12 LAB — HM DEXA SCAN

## 2019-05-13 ENCOUNTER — Telehealth: Payer: Self-pay | Admitting: Family Medicine

## 2019-05-13 NOTE — Telephone Encounter (Signed)
Pt wants to know if Dr Volanda Napoleon can call in the vitamin D as a rx to the pharmacy so her insurance will pay.  [place], [place] [employer] Phone:  [phone]  Fax:  [phone]

## 2019-05-13 NOTE — Telephone Encounter (Signed)
Message Routed to PCP CMA 

## 2019-05-14 NOTE — Telephone Encounter (Signed)
Message Routed to PCP CMA 

## 2019-05-14 NOTE — Telephone Encounter (Signed)
Pt stated that [facility] doesn't have [place] vitamin D and that Dr. Volanda Napoleon needs to send a generic / please call Pt to let her know when this is sent to the pharmacy

## 2019-05-14 NOTE — Telephone Encounter (Signed)
Please advise 

## 2019-05-19 ENCOUNTER — Telehealth: Payer: Self-pay | Admitting: Nurse Practitioner

## 2019-05-19 NOTE — Telephone Encounter (Signed)
Patient was called and notified of DEXA report. Due to CKD not a candidate for bisphosphonate. She is starting calcium and Vit D per PCP. I encouraged her to do weight bearing exercises. Continue AI for now. May discuss changing to tamoxifen at next f/u. I reviewed date/time. She understands and appreciates the call.  Cira Rue, NP  05/19/19

## 2019-05-21 ENCOUNTER — Other Ambulatory Visit: Payer: Self-pay | Admitting: Family Medicine

## 2019-05-21 DIAGNOSIS — M810 Age-related osteoporosis without current pathological fracture: Secondary | ICD-10-CM

## 2019-05-21 MED ORDER — CALCIUM CARBONATE-VITAMIN D 500-200 MG-UNIT PO TABS: 1.0000 | ORAL_TABLET | Freq: Every day | ORAL | 3 refills | Status: DC

## 2019-05-21 NOTE — Telephone Encounter (Signed)
Rx sent to local pharmacy.  

## 2019-06-22 ENCOUNTER — Telehealth: Payer: Medicare Other | Admitting: Family Medicine

## 2019-07-15 ENCOUNTER — Other Ambulatory Visit: Payer: Self-pay

## 2019-07-15 ENCOUNTER — Telehealth: Payer: Self-pay | Admitting: Family Medicine

## 2019-07-15 DIAGNOSIS — C50211 Malignant neoplasm of upper-inner quadrant of right female breast: Secondary | ICD-10-CM

## 2019-07-15 DIAGNOSIS — E1140 Type 2 diabetes mellitus with diabetic neuropathy, unspecified: Secondary | ICD-10-CM

## 2019-07-15 MED ORDER — ONETOUCH DELICA LANCING DEV MISC: 0 refills | Status: DC

## 2019-07-15 NOTE — Telephone Encounter (Signed)
One touch [place]  meter sent to pt pharmacy as requested

## 2019-07-15 NOTE — Telephone Encounter (Signed)
Pt is needing a replacement one touch meter hers is no longer working. She needs it as soon as possible.

## 2019-07-23 ENCOUNTER — Telehealth: Payer: Self-pay | Admitting: Family Medicine

## 2019-07-23 NOTE — Telephone Encounter (Signed)
Pt appointment is on next week Thursday which is ok, but if pt has acute symptoms of diabetes she could be seen on Wednesday 07/28/2019

## 2019-07-23 NOTE — Telephone Encounter (Signed)
This patient is scheduled to see Dr. Volanda Napoleon on 07/29/2019 for sugar in urine.   Does this patient need to be seen earlier?  Please advise

## 2019-07-28 ENCOUNTER — Other Ambulatory Visit: Payer: Self-pay

## 2019-07-29 ENCOUNTER — Encounter: Payer: Self-pay | Admitting: Family Medicine

## 2019-07-29 ENCOUNTER — Other Ambulatory Visit: Payer: Self-pay | Admitting: Family Medicine

## 2019-07-29 ENCOUNTER — Ambulatory Visit (INDEPENDENT_AMBULATORY_CARE_PROVIDER_SITE_OTHER): Payer: Medicare Other | Admitting: Family Medicine

## 2019-07-29 VITALS — BP 160/86 | HR 95 | Temp 97.8°F | Wt 161.8 lb

## 2019-07-29 DIAGNOSIS — R6889 Other general symptoms and signs: Secondary | ICD-10-CM | POA: Diagnosis not present

## 2019-07-29 DIAGNOSIS — R81 Glycosuria: Secondary | ICD-10-CM | POA: Diagnosis not present

## 2019-07-29 DIAGNOSIS — N760 Acute vaginitis: Secondary | ICD-10-CM | POA: Diagnosis not present

## 2019-07-29 DIAGNOSIS — I10 Essential (primary) hypertension: Secondary | ICD-10-CM

## 2019-07-29 DIAGNOSIS — E1140 Type 2 diabetes mellitus with diabetic neuropathy, unspecified: Secondary | ICD-10-CM | POA: Diagnosis not present

## 2019-07-29 LAB — POCT URINALYSIS DIPSTICK
Blood, UA: NEGATIVE
Glucose, UA: POSITIVE — AB
Ketones, UA: NEGATIVE
Nitrite, UA: NEGATIVE
Protein, UA: NEGATIVE
Spec Grav, UA: >=1.03 — AB (ref 1.010–1.025)
Urobilinogen, UA: 0.2 E.U./dL
pH, UA: 5 (ref 5.0–8.0)

## 2019-07-29 LAB — BASIC METABOLIC PANEL
BUN: 17 mg/dL (ref 6–23)
CO2: 29 mEq/L (ref 19–32)
Calcium: 9.7 mg/dL (ref 8.4–10.5)
Chloride: 102 mEq/L (ref 96–112)
Creatinine, Ser: 1.13 mg/dL (ref 0.40–1.20)
GFR: 56.06 mL/min — ABNORMAL LOW (ref >60.00)
Glucose, Bld: 353 mg/dL — ABNORMAL HIGH (ref 70–99)
Potassium: 5 mEq/L (ref 3.5–5.1)
Sodium: 138 mEq/L (ref 135–145)

## 2019-07-29 LAB — LIPID PANEL
Cholesterol: 171 mg/dL (ref 0–200)
HDL: 40.9 mg/dL (ref >39.00)
LDL Cholesterol: 111 mg/dL — ABNORMAL HIGH (ref 0–99)
NonHDL: 130.35
Total CHOL/HDL Ratio: 4
Triglycerides: 98 mg/dL (ref 0.0–149.0)
VLDL: 19.6 mg/dL (ref 0.0–40.0)

## 2019-07-29 LAB — MICROALBUMIN / CREATININE URINE RATIO
Creatinine,U: 73.5 mg/dL
Microalb Creat Ratio: 3.6 mg/g (ref 0.0–30.0)
Microalb, Ur: 2.6 mg/dL — ABNORMAL HIGH (ref 0.0–1.9)

## 2019-07-29 LAB — HEMOGLOBIN A1C: Hgb A1c MFr Bld: 12.9 % — ABNORMAL HIGH (ref 4.6–6.5)

## 2019-07-29 MED ORDER — BLOOD GLUCOSE MONITOR KIT: PACK | 0 refills | Status: DC

## 2019-07-29 MED ORDER — FLUCONAZOLE 150 MG PO TABS: 150.0000 mg | ORAL_TABLET | Freq: Once | ORAL | 0 refills | Status: AC

## 2019-07-29 NOTE — Patient Instructions (Signed)
Diabetic Neuropathy Diabetic neuropathy refers to nerve damage that is caused by diabetes (diabetes mellitus). Over time, people with diabetes can develop nerve damage throughout the body. There are several types of diabetic neuropathy:  Peripheral neuropathy. This is the most common type of diabetic neuropathy. It causes damage to nerves that carry signals between the spinal cord and other parts of the body (peripheral nerves). This usually affects nerves in the feet and legs first, and may eventually affect the hands and arms. The damage affects the ability to sense touch or temperature.  Autonomic neuropathy. This type causes damage to nerves that control involuntary functions (autonomic nerves). These nerves carry signals that control: ? Heartbeat. ? Body temperature. ? Blood pressure. ? Urination. ? Digestion. ? Sweating. ? Sexual function. ? Response to changing blood sugar (glucose) levels.  Focal neuropathy. This type of nerve damage affects one area of the body, such as an arm, a leg, or the face. The injury may involve one nerve or a small group of nerves. Focal neuropathy can be painful and unpredictable, and occurs most often in older adults with diabetes. This often develops suddenly, but usually improves over time and does not cause long-term problems.  Proximal neuropathy. This type of nerve damage affects the nerves of the thighs, hips, buttocks, or legs. It causes severe pain, weakness, and muscle death (atrophy), usually in the thigh muscles. It is more common among older men and people who have type 2 diabetes. The length of recovery time may vary. What are the causes? Peripheral, autonomic, and focal neuropathies are caused by diabetes that is not well controlled with treatment. The cause of proximal neuropathy is not known, but it may be caused by inflammation related to uncontrolled blood glucose levels. What are the signs or symptoms? Peripheral neuropathy Peripheral  neuropathy develops slowly over time. When the nerves of the feet and legs no longer work, you may experience:  Burning, stabbing, or aching pain in the legs or feet.  Pain or cramping in the legs or feet.  Loss of feeling (numbness) and inability to feel pressure or pain in the feet. This can lead to: ? Thick calluses or sores on areas of constant pressure. ? Ulcers. ? Reduced ability to feel temperature changes.  Foot deformities.  Muscle weakness.  Loss of balance or coordination. Autonomic neuropathy The symptoms of autonomic neuropathy vary depending on which nerves are affected. Symptoms may include:  Problems with digestion, such as: ? Nausea or vomiting. ? Poor appetite. ? Bloating. ? Diarrhea or constipation. ? Trouble swallowing. ? Losing weight without trying to.  Problems with the heart, blood and lungs, such as: ? Dizziness, especially when standing up. ? Fainting. ? Shortness of breath. ? Irregular heartbeat.  Bladder problems, such as: ? Trouble starting or stopping urination. ? Leaking urine. ? Trouble emptying the bladder. ? Urinary tract infections (UTIs).  Problems with other body functions, such as: ? Sweat. You may sweat too much or too little. ? Temperature. You might get hot easily. Or, you might feel cold more than usual. ? Sexual function. Men may not be able to get or maintain an erection. Women may have vaginal dryness and difficulty with arousal. Focal neuropathy Symptoms affect only one area of the body. Common symptoms include:  Numbness.  Tingling.  Burning pain.  Prickling feeling.  Very sensitive skin.  Weakness.  Inability to move (paralysis).  Muscle twitching.  Muscles getting smaller (wasting).  Poor coordination.  Double or blurred vision. Proximal   neuropathy  Sudden, severe pain in the hip, thigh, or buttocks. Pain may spread from the back into the legs (sciatica).  Pain and numbness in the arms and  legs.  Tingling.  Loss of bladder control or bowel control.  Weakness and wasting of thigh muscles.  Difficulty getting up from a seated position.  Abdominal swelling.  Unexplained weight loss. How is this diagnosed? Diagnosis usually involves reviewing your medical history and any symptoms you have. Diagnosis varies depending on the type of neuropathy your health care provider suspects. Peripheral neuropathy Your health care provider will check areas that are affected by your nervous system (neurologic exam), such as your reflexes, how you move, and what you can feel. You may have other tests, such as:  Blood tests.  Removal and examination of fluid that surrounds the spinal cord (lumbar puncture).  CT scan.  MRI.  A test to check the nerves that control muscles (electromyogram, EMG).  Tests of how quickly messages pass through your nerves (nerve conduction velocity tests).  Removal of a small piece of nerve to be examined under a microscope (biopsy). Autonomic neuropathy You may have tests, such as:  Tests to measure your blood pressure and heart rate. This may include monitoring you while you are safely secured to an exam table that moves you from a lying position to an upright position (table tilt test).  Breathing tests to check your lungs.  Tests to check how food moves through the digestive system (gastric emptying tests).  Blood, sweat, or urine tests.  Ultrasound of your bladder.  Spinal fluid tests. Focal neuropathy This condition may be diagnosed with:  A neurologic exam.  CT scan.  MRI.  EMG.  Nerve conduction velocity tests. Proximal neuropathy There is no test to diagnose this type of neuropathy. You may have tests to rule out other possible causes of this type of neuropathy. Tests may include:  X-rays of your spine and lumbar region.  Lumbar puncture.  MRI. How is this treated? The goal of treatment is to keep nerve damage from getting  worse. The most important part of treatment is keeping your blood glucose level and your A1C level within your target range by following your diabetes management plan. Over time, maintaining lower blood glucose levels helps lessen symptoms. In some cases, you may need prescription pain medicine. Follow these instructions at home:  Lifestyle   Do not use any products that contain nicotine or tobacco, such as cigarettes and e-cigarettes. If you need help quitting, ask your health care provider.  Be physically active every day. Include strength training and balance exercises.  Follow a healthy meal plan.  Work with your health care provider to manage your blood pressure. General instructions  Follow your diabetes management plan as directed. ? Check your blood glucose levels as directed by your health care provider. ? Keep your blood glucose in your target range as directed by your health care provider. ? Have your A1C level checked at least two times a year, or as often as told by your health care provider.  Take over the counter and prescription medicines only as told by your health care provider. This includes insulin and diabetes medicine.  Do not drive or use heavy machinery while taking prescription pain medicines.  Check your skin and feet every day for cuts, bruises, redness, blisters, or sores.  Keep all follow up visits as told by your health care provider. This is important. Contact a health care provider if:  You have burning, stabbing, or aching pain in your legs or feet.  You are unable to feel pressure or pain in your feet.  You develop problems with digestion, such as: ? Nausea. ? Vomiting. ? Bloating. ? Constipation. ? Diarrhea. ? Abdominal pain.  You have difficulty with urination, such as inability: ? To control when you urinate (incontinence). ? To completely empty the bladder (retention).  You have palpitations.  You feel dizzy, weak, or faint when you  stand up. Get help right away if:  You cannot urinate.  You have sudden weakness or loss of coordination.  You have trouble speaking.  You have pain or pressure in your chest.  You have an irregular heart beat.  You have sudden inability to move a part of your body. Summary  Diabetic neuropathy refers to nerve damage that is caused by diabetes. It can affect nerves throughout the entire body, causing numbness and pain in the arms, legs, digestive tract, heart, and other body systems.  Keep your blood glucose level and your blood pressure in your target range, as directed by your health care provider. This can help prevent neuropathy from getting worse.  Check your skin and feet every day for cuts, bruises, redness, blisters, or sores.  Do not use any products that contain nicotine or tobacco, such as cigarettes and e-cigarettes. If you need help quitting, ask your health care provider. This information is not intended to replace advice given to you by your health care provider. Make sure you discuss any questions you have with your health care provider. Document Revised: 05/28/2017 Document Reviewed: 05/20/2016 Elsevier Patient Education  2020 [employer] [place] for Diabetes Mellitus, Adult  Carbohydrate counting is a method of keeping track of how many carbohydrates you eat. Eating carbohydrates naturally increases the amount of sugar (glucose) in the blood. Counting how many carbohydrates you eat helps keep your blood glucose within normal limits, which helps you manage your diabetes (diabetes mellitus). It is important to know how many carbohydrates you can safely have in each meal. This is different for every person. A diet and nutrition specialist (registered dietitian) can help you make a meal plan and calculate how many carbohydrates you should have at each meal and snack. Carbohydrates are found in the following foods:  Grains, such as breads and  cereals.  Dried beans and soy products.  Starchy vegetables, such as potatoes, peas, and corn.  Fruit and fruit juices.  Milk and yogurt.  Sweets and snack foods, such as cake, cookies, candy, chips, and soft drinks. How do I count carbohydrates? There are two ways to count carbohydrates in food. You can use either of the methods or a combination of both. Reading "Nutrition Facts" on packaged food The "Nutrition Facts" list is included on the labels of almost all packaged foods and beverages in the [place] It includes:  The serving size.  Information about nutrients in each serving, including the grams (g) of carbohydrate per serving. To use the "Nutrition Facts":  Decide how many servings you will have.  Multiply the number of servings by the number of carbohydrates per serving.  The resulting number is the total amount of carbohydrates that you will be having. Learning standard serving sizes of other foods When you eat carbohydrate foods that are not packaged or do not include "Nutrition Facts" on the label, you need to measure the servings in order to count the amount of carbohydrates:  Measure the foods that you will eat  with a food scale or measuring cup, if needed.  Decide how many standard-size servings you will eat.  Multiply the number of servings by 15. Most carbohydrate-rich foods have about 15 g of carbohydrates per serving. ? For example, if you eat 8 oz (170 g) of strawberries, you will have eaten 2 servings and 30 g of carbohydrates (2 servings x 15 g = 30 g).  For foods that have more than one food mixed, such as soups and casseroles, you must count the carbohydrates in each food that is included. The following list contains standard serving sizes of common carbohydrate-rich foods. Each of these servings has about 15 g of carbohydrates:   hamburger bun or  English muffin.   oz (15 mL) syrup.   oz (14 g) jelly.  1 slice of bread.  1 six-inch  tortilla.  3 oz (85 g) cooked rice or pasta.  4 oz (113 g) cooked dried beans.  4 oz (113 g) starchy vegetable, such as peas, corn, or potatoes.  4 oz (113 g) hot cereal.  4 oz (113 g) mashed potatoes or  of a large baked potato.  4 oz (113 g) canned or frozen fruit.  4 oz (120 mL) fruit juice.  4-6 crackers.  6 chicken nuggets.  6 oz (170 g) unsweetened dry cereal.  6 oz (170 g) plain fat-free yogurt or yogurt sweetened with artificial sweeteners.  8 oz (240 mL) milk.  8 oz (170 g) fresh fruit or one small piece of fruit.  24 oz (680 g) popped popcorn. Example of carbohydrate counting Sample meal  3 oz (85 g) chicken breast.  6 oz (170 g) brown rice.  4 oz (113 g) corn.  8 oz (240 mL) milk.  8 oz (170 g) strawberries with sugar-free whipped topping. Carbohydrate calculation 1. Identify the foods that contain carbohydrates: ? Rice. ? Corn. ? Milk. ? Strawberries. 2. Calculate how many servings you have of each food: ? 2 servings rice. ? 1 serving corn. ? 1 serving milk. ? 1 serving strawberries. 3. Multiply each number of servings by 15 g: ? 2 servings rice x 15 g = 30 g. ? 1 serving corn x 15 g = 15 g. ? 1 serving milk x 15 g = 15 g. ? 1 serving strawberries x 15 g = 15 g. 4. Add together all of the amounts to find the total grams of carbohydrates eaten: ? 30 g + 15 g + 15 g + 15 g = 75 g of carbohydrates total. Summary  Carbohydrate counting is a method of keeping track of how many carbohydrates you eat.  Eating carbohydrates naturally increases the amount of sugar (glucose) in the blood.  Counting how many carbohydrates you eat helps keep your blood glucose within normal limits, which helps you manage your diabetes.  A diet and nutrition specialist (registered dietitian) can help you make a meal plan and calculate how many carbohydrates you should have at each meal and snack. This information is not intended to replace advice given to you by  your health care provider. Make sure you discuss any questions you have with your health care provider. Document Revised: 11/07/2016 Document Reviewed: 09/27/2015 Elsevier Patient Education  [place].  Diabetes Mellitus and Nutrition, Adult When you have diabetes (diabetes mellitus), it is very important to have healthy eating habits because your blood sugar (glucose) levels are greatly affected by what you eat and drink. Eating healthy foods in the appropriate amounts, at about  the same times every day, can help you:  Control your blood glucose.  Lower your risk of heart disease.  Improve your blood pressure.  Reach or maintain a healthy weight. Every person with diabetes is different, and each person has different needs for a meal plan. Your health care provider may recommend that you work with a diet and nutrition specialist (dietitian) to make a meal plan that is best for you. Your meal plan may vary depending on factors such as:  The calories you need.  The medicines you take.  Your weight.  Your blood glucose, blood pressure, and cholesterol levels.  Your activity level.  Other health conditions you have, such as heart or kidney disease. How do carbohydrates affect me? Carbohydrates, also called carbs, affect your blood glucose level more than any other type of food. Eating carbs naturally raises the amount of glucose in your blood. Carb counting is a method for keeping track of how many carbs you eat. Counting carbs is important to keep your blood glucose at a healthy level, especially if you use insulin or take certain oral diabetes medicines. It is important to know how many carbs you can safely have in each meal. This is different for every person. Your dietitian can help you calculate how many carbs you should have at each meal and for each snack. Foods that contain carbs include:  Bread, cereal, rice, pasta, and crackers.  Potatoes and corn.  Peas, beans, and  lentils.  Milk and yogurt.  Fruit and juice.  Desserts, such as cakes, cookies, ice cream, and candy. How does alcohol affect me? Alcohol can cause a sudden decrease in blood glucose (hypoglycemia), especially if you use insulin or take certain oral diabetes medicines. Hypoglycemia can be a life-threatening condition. Symptoms of hypoglycemia (sleepiness, dizziness, and confusion) are similar to symptoms of having too much alcohol. If your health care provider says that alcohol is safe for you, follow these guidelines:  Limit alcohol intake to no more than 1 drink per day for nonpregnant women and 2 drinks per day for men. One drink equals 12 oz of beer, 5 oz of wine, or 1 oz of hard liquor.  Do not drink on an empty stomach.  Keep yourself hydrated with water, diet soda, or unsweetened iced tea.  Keep in mind that regular soda, juice, and other mixers may contain a lot of sugar and must be counted as carbs. What are tips for following this plan?  Reading food labels  Start by checking the serving size on the "Nutrition Facts" label of packaged foods and drinks. The amount of calories, carbs, fats, and other nutrients listed on the label is based on one serving of the item. Many items contain more than one serving per package.  Check the total grams (g) of carbs in one serving. You can calculate the number of servings of carbs in one serving by dividing the total carbs by 15. For example, if a food has 30 g of total carbs, it would be equal to 2 servings of carbs.  Check the number of grams (g) of saturated and trans fats in one serving. Choose foods that have low or no amount of these fats.  Check the number of milligrams (mg) of salt (sodium) in one serving. Most people should limit total sodium intake to less than 2,300 mg per day.  Always check the nutrition information of foods labeled as "low-fat" or "nonfat". These foods may be higher in added sugar or  refined carbs and should  be avoided.  Talk to your dietitian to identify your daily goals for nutrients listed on the label. Shopping  Avoid buying canned, premade, or processed foods. These foods tend to be high in fat, sodium, and added sugar.  Shop around the outside edge of the grocery store. This includes fresh fruits and vegetables, bulk grains, fresh meats, and fresh dairy. Cooking  Use low-heat cooking methods, such as baking, instead of high-heat cooking methods like deep frying.  Cook using healthy oils, such as olive, canola, or sunflower oil.  Avoid cooking with butter, cream, or high-fat meats. Meal planning  Eat meals and snacks regularly, preferably at the same times every day. Avoid going long periods of time without eating.  Eat foods high in fiber, such as fresh fruits, vegetables, beans, and whole grains. Talk to your dietitian about how many servings of carbs you can eat at each meal.  Eat 4-6 ounces (oz) of lean protein each day, such as lean meat, chicken, fish, eggs, or tofu. One oz of lean protein is equal to: ? 1 oz of meat, chicken, or fish. ? 1 egg. ?  cup of tofu.  Eat some foods each day that contain healthy fats, such as avocado, nuts, seeds, and fish. Lifestyle  Check your blood glucose regularly.  Exercise regularly as told by your health care provider. This may include: ? 150 minutes of moderate-intensity or vigorous-intensity exercise each week. This could be brisk walking, biking, or water aerobics. ? Stretching and doing strength exercises, such as yoga or weightlifting, at least 2 times a week.  Take medicines as told by your health care provider.  Do not use any products that contain nicotine or tobacco, such as cigarettes and e-cigarettes. If you need help quitting, ask your health care provider.  Work with a Social worker or diabetes educator to identify strategies to manage stress and any emotional and social challenges. Questions to ask a health care  provider  Do I need to meet with a diabetes educator?  Do I need to meet with a dietitian?  What number can I call if I have questions?  When are the best times to check my blood glucose? Where to find more information:  American Diabetes Association: diabetes.org  Academy of Nutrition and Dietetics: www.eatright.[employer] of Diabetes and Digestive and Kidney Diseases (NIH): [employer] Summary  A healthy meal plan will help you control your blood glucose and maintain a healthy lifestyle.  Working with a diet and nutrition specialist (dietitian) can help you make a meal plan that is best for you.  Keep in mind that carbohydrates (carbs) and alcohol have immediate effects on your blood glucose levels. It is important to count carbs and to use alcohol carefully. This information is not intended to replace advice given to you by your health care provider. Make sure you discuss any questions you have with your health care provider. Document Revised: 03/28/2017 Document Reviewed: 05/20/2016 Elsevier Patient Education  2020 [place].  Diabetes Mellitus and Standards of Medical Care Managing diabetes (diabetes mellitus) can be complicated. Your diabetes treatment may be managed by a team of health care providers, including:  A physician who specializes in diabetes (endocrinologist).  A nurse practitioner or physician assistant.  Nurses.  A diet and nutrition specialist (registered dietitian).  A certified diabetes educator (CDE).  An exercise specialist.  A pharmacist.  An eye doctor.  A foot specialist (podiatrist).  A dentist.  A primary care  provider.  A mental health provider. Your health care providers follow guidelines to help you get the best quality of care. The following schedule is a general guideline for your diabetes management plan. Your health care providers may give you more specific instructions. Physical exams Upon being  diagnosed with diabetes mellitus, and each year after that, your health care provider will ask about your medical and family history. He or she will also do a physical exam. Your exam may include:  Measuring your height, weight, and body mass index (BMI).  Checking your blood pressure. This will be done at every routine medical visit. Your target blood pressure may vary depending on your medical conditions, your age, and other factors.  Thyroid gland exam.  Skin exam.  Screening for damage to your nerves (peripheral neuropathy). This may include checking the pulse in your legs and feet and checking the level of sensation in your hands and feet.  A complete foot exam to inspect the structure and skin of your feet, including checking for cuts, bruises, redness, blisters, sores, or other problems.  Screening for blood vessel (vascular) problems, which may include checking the pulse in your legs and feet and checking your temperature. Blood tests Depending on your treatment plan and your personal needs, you may have the following tests done:  HbA1c (hemoglobin A1c). This test provides information about blood sugar (glucose) control over the previous 2-3 months. It is used to adjust your treatment plan, if needed. This test will be done: ? At least 2 times a year, if you are meeting your treatment goals. ? 4 times a year, if you are not meeting your treatment goals or if treatment goals have changed.  Lipid testing, including total, LDL, and HDL cholesterol and triglyceride levels. ? The goal for LDL is less than 100 mg/dL (5.5 mmol/L). If you are at high risk for complications, the goal is less than 70 mg/dL (3.9 mmol/L). ? The goal for HDL is 40 mg/dL (2.2 mmol/L) or higher for men and 50 mg/dL (2.8 mmol/L) or higher for women. An HDL cholesterol of 60 mg/dL (3.3 mmol/L) or higher gives some protection against heart disease. ? The goal for triglycerides is less than 150 mg/dL (8.3  mmol/L).  Liver function tests.  Kidney function tests.  Thyroid function tests. Dental and eye exams  Visit your dentist two times a year.  If you have type 1 diabetes, your health care provider may recommend an eye exam 3-5 years after you are diagnosed, and then once a year after your first exam. ? For children with type 1 diabetes, a health care provider may recommend an eye exam when your child is age 45 or older and has had diabetes for 3-5 years. After the first exam, your child should get an eye exam once a year.  If you have type 2 diabetes, your health care provider may recommend an eye exam as soon as you are diagnosed, and then once a year after your first exam. Immunizations   The yearly flu (influenza) vaccine is recommended for everyone 6 months or older who has diabetes.  The pneumonia (pneumococcal) vaccine is recommended for everyone 2 years or older who has diabetes. If you are 84 or older, you may get the pneumonia vaccine as a series of two separate shots.  The hepatitis B vaccine is recommended for adults shortly after being diagnosed with diabetes.  Adults and children with diabetes should receive all other vaccines according to age-specific recommendations from  the Centers for Disease Control and Prevention (CDC). Mental and emotional health Screening for symptoms of eating disorders, anxiety, and depression is recommended at the time of diagnosis and afterward as needed. If your screening shows that you have symptoms (positive screening result), you may need more evaluation and you may work with a mental health care provider. Treatment plan Your treatment plan will be reviewed at every medical visit. You and your health care provider will discuss:  How you are taking your medicines, including insulin.  Any side effects you are experiencing.  Your blood glucose target goals.  The frequency of your blood glucose monitoring.  Lifestyle habits, such as  activity level as well as tobacco, alcohol, and substance use. Diabetes self-management education Your health care provider will assess how well you are monitoring your blood glucose levels and whether you are taking your insulin correctly. He or she may refer you to:  A certified diabetes educator to manage your diabetes throughout your life, starting at diagnosis.  A registered dietitian who can create or review your personal nutrition plan.  An exercise specialist who can discuss your activity level and exercise plan. Summary  Managing diabetes (diabetes mellitus) can be complicated. Your diabetes treatment may be managed by a team of health care providers.  Your health care providers follow guidelines in order to help you get the best quality of care.  Standards of care including having regular physical exams, blood tests, blood pressure monitoring, immunizations, screening tests, and education about how to manage your diabetes.  Your health care providers may also give you more specific instructions based on your individual health. This information is not intended to replace advice given to you by your health care provider. Make sure you discuss any questions you have with your health care provider. Document Revised: 01/02/2018 Document Reviewed: 01/12/2016 Elsevier Patient Education  [place].

## 2019-07-29 NOTE — Progress Notes (Signed)
Subjective:    Patient ID: Jasmine Carlson, female    DOB: Mar 04, 1941, 79 y.o.   MRN: [id number]  Chief Complaint  Patient presents with  . glucose in urine    HPI Pt is a 79 yo female with pmh sig for osteoporosis, h/o TIA, OA, h/o breast cancer who was seen today for f/u.  Pt states she was told she had glucose in her urine by Chartered loss adjuster.  Pt taking Metformin 500 mg daily.  Notes eating more fruit.  Endorses vaginal pruritis that was getting better but returned.  Also notes occasional dizziness.  Denies increased thirst, hunger, or urination.  Had a recent eye exam at [facility], but unsure if had diabetic retinopathy screen.  Pt inquires about getting a new glucometer as has been unable to check bs lately.  States bp has been good, but has not taken med this am.  Past Medical History:  Diagnosis Date  . Arthritis   . Breast cancer ([place])   . Colon polyps   . Diabetes mellitus without complication ([place])   . GI bleed   . Hyperlipidemia   . Hypertension   . Personal history of chemotherapy   . Personal history of radiation therapy   . Stroke ([place])     No Known Allergies  ROS General: Denies fever, chills, night sweats, changes in weight, changes in appetite  +dizziness HEENT: Denies headaches, ear pain, changes in vision, rhinorrhea, sore throat CV: Denies CP, palpitations, SOB, orthopnea Pulm: Denies SOB, cough, wheezing GI: Denies abdominal pain, nausea, vomiting, diarrhea, constipation GU: Denies dysuria, hematuria, frequency   +vaginal irritation, glucosuria Msk: Denies muscle cramps, joint pains Neuro: Denies weakness, numbness, tingling Skin: Denies rashes, bruising Psych: Denies depression, anxiety, hallucinations   Objective:    Blood pressure (!) 160/86, pulse 95, temperature 97.8 F (36.6 C), temperature source Temporal, weight 161 lb 12.8 oz (73.4 kg), SpO2 97 %.  Gen. Pleasant, well-nourished, in no distress, normal affect  HEENT: [place]/AT, face symmetric, no  scleral icterus, PERRLA, EOMI, nares patent without drainage Lungs: no accessory muscle use, CTAB, no wheezes or rales Cardiovascular: RRR, no m/r/g, no peripheral edema Abdomen: BS present, soft, NT/ND Neuro:  A&Ox3, CN II-XII intact, normal gait Skin:  Warm, no lesions/ rash  Wt Readings from Last 3 Encounters:  07/29/19 161 lb 12.8 oz (73.4 kg)  02/08/19 171 lb (77.6 kg)  04/09/18 172 lb 8 oz (78.2 kg)    Lab Results  Component Value Date   WBC 6.8 02/08/2019   HGB 12.6 02/08/2019   HCT 40.4 02/08/2019   PLT 191 02/08/2019   GLUCOSE 212 (H) 02/08/2019   ALT 12 02/08/2019   AST 14 (L) 02/08/2019   NA 138 02/08/2019   K 4.3 02/08/2019   CL 102 02/08/2019   CREATININE 1.34 (H) 02/08/2019   BUN 21 02/08/2019   CO2 27 02/08/2019   INR 1.15 11/04/2017   HGBA1C 8.6 (H) 05/08/2018    Assessment/Plan:  Type 2 diabetes mellitus with diabetic neuropathy, without long-term current use of insulin ([place]) -last hgb A1C 8.6% on 05/08/2018 -continue metformin 500 mg daily.  Will adjust dose if needed. -discussed lifestyle modifications including decreasing intake of fruits -on an ARB -refer to Ophtho for diabetic retinopathy screen - Plan: Microalbumin/Creatinine Ratio, Urine, Basic metabolic panel, Hemoglobin A1c, Lipid panel, blood glucose meter kit and supplies KIT, For Home Use Only DME Diabetic Shoe, referral to Ophthalmology  Glucose found in urine on examination  - Plan: POCT urinalysis  dipstick, Culture, Urine, fluconazole (DIFLUCAN) 150 MG tablet  Essential hypertension -elevated this am.  Likely 2/2 pt not taking med this am. -encouraged to take medicine when gets home -lifestyle modifications enocuraged -pt advised to check bp regularly and keep a log of readings to bring with her to clinic. -continue current meds: Coreg 6.25 mg BID, losartan 100 mg  Acute vaginitis  -likely from hyperglycemia/glucosuria - Plan: fluconazole (DIFLUCAN) 150 MG tablet  F/u  prn  Grier Mitts, MD

## 2019-07-31 LAB — URINE CULTURE
MICRO NUMBER:: 10317991
SPECIMEN QUALITY:: ADEQUATE

## 2019-08-02 ENCOUNTER — Encounter: Payer: Self-pay | Admitting: Family Medicine

## 2019-08-02 ENCOUNTER — Other Ambulatory Visit: Payer: Self-pay | Admitting: Family Medicine

## 2019-08-02 DIAGNOSIS — Z8673 Personal history of transient ischemic attack (TIA), and cerebral infarction without residual deficits: Secondary | ICD-10-CM

## 2019-08-02 DIAGNOSIS — I10 Essential (primary) hypertension: Secondary | ICD-10-CM

## 2019-08-02 DIAGNOSIS — E119 Type 2 diabetes mellitus without complications: Secondary | ICD-10-CM

## 2019-08-02 MED ORDER — METFORMIN HCL 500 MG PO TABS: ORAL_TABLET | ORAL | 1 refills | Status: DC

## 2019-08-02 NOTE — Progress Notes (Signed)
[place]   Telephone:([phone]) [phone] [phone]   Clinic Follow up Note   Patient Care Team: [name], MD as PCP - General (Family Medicine)  Date of Service:  08/05/2019  CHIEF COMPLAINT: F/u of right breast cancer  SUMMARY OF ONCOLOGIC HISTORY: Oncology History Overview Note  Cancer Staging Breast cancer of upper-inner quadrant of right female breast [facility]) Staging form: Breast, AJCC 8th Edition - Pathologic stage from 08/02/2014: Stage IA (pT1c, pN0, cM0, G2, ER: Positive, PR: Positive, HER2: Positive) - Signed by Truitt Merle, MD on 01/04/2017 - Clinical: No stage assigned - Unsigned     Breast cancer of upper-inner quadrant of right female breast ([place])  05/25/2014 Mammogram   Suspicious lesion at 3 o'clock position in the right breast   06/15/2014 Breast US   Suspicious nodule, a spiculated hypoechoic mass, 1.1 cm at the 1 o'clock position   06/30/2014 Initial Biopsy   US Guided Core Biopsy: Invasive Ductal Carcinoma, [place] Grade 2. Addendum: ER positive (100%), PR positive (75-80%), HER2 positive    06/30/2014 Initial Diagnosis   Breast cancer of upper-inner quadrant of right female breast ([place]) from Right Breast Lumpectomy: Invasive Ductal Carcinoma, [place] Grade 2, surgical margins free of carcinoma; Sentinel nodes 2/2 free of tumor   08/02/2014 Surgery   Right Breast Lumpectomy with SLN biopsy; Attending physician Dr. Tamera Punt at [facility] in [place], [place]   08/02/2014 Pathology Results   Right breast lumpectomy: Invasive ductal carcinoma, Duanne Moron Grade 2; size or largest invasive carcinoma 1.4 cm; Sentinel lymph nodes 2/2 free of tumor, margins free of tumor    09/22/2014 Echocardiogram   MUGA scan: normal ejection fraction of 57.7%   10/01/2014 - 10/03/2015 Adjuvant Chemotherapy   Adjuvant weekly Taxol from 09/27/20 to 08/01/95 complicated by fatigue, diarrhea, and leukopenia requiring treatment delays and dosage adjustments. Records  indicate she received 8 of 12 doses. Completed adjuvant Herceptin q3 weeks from 10/01/14 to 10/03/15.    06/12/2015 - 08/04/2015 Radiation Therapy   Received 4,860 cGy to right breast in 27 fractions Received 1,600 cGy boost in 8 fractions    09/12/2015 -  Anti-estrogen oral therapy   She began Arimidex after completing adjuvant radiation   06/25/2016 Imaging   CT Chest, Abdomen, Pelvis with contrast, compared with PET/CT on 04/25/2015 and CT 09/06/2014 - Impression: Chest: 1. Unchanged multiple subcentimeter pulmonary nodules bilaterally.there are no new pulmonary nodules. 2. No thoracic lymphadenopathy. 3. New right lung post radiation changes. 4. New diffuse right breast skin thickening suggestive of post radiation changes. Abdomen/pelvis 1. No new mass or lymphadenopathy in the abdomen and pelvis, no CT evidence of metastatic disease. 2. Unchanged adjacent soft tissue nodules inferior to the right hepatic lobe. 3. Colonic diverticulosis. 4. Status post hysterectomy.   06/29/2016 Mammogram   Digital bilateral mammogram with CAD and TOMO findings: the breast parenchyma demonstrates scattered fibroglandular densities. There are no suspicious calcifications, masses, architectural distortion or skin thickening. Surgical clips are present in the right breast at posterior depth. Vascular calcifications are present. No mammographic evidence of malignancy or referred is is gravida   12/23/2017 Mammogram   12/23/2017 Mammogram IMPRESSION: No mammographic evidence of malignancy in either breast, status post right lumpectomy.   12/25/2018 Mammogram   1. No evidence of new or recurrent breast carcinoma. 2. Benign postsurgical changes on the right.      CURRENT THERAPY:  Anastrozole 58m daily starting 09/12/15. Due to osteoporosis I switched her to Tamoxifen in 07/2019.  INTERVAL HISTORY:  Jasmine Carlson is here for a follow up of right breast cancer. She was last seen by me 10 months ago and seen by  NP Lacie in interim 6 months ago. She presents to the clinic alone. She notes she is doing well. She notes her Glucometer has ran out of batteries and she just got a new meter today. She saw her PCP and her DM is not well controlled. She notes she is on Anastrozole ad tolerating well. She denies joint pain or hot flashes. She denies any new pain. Her chronic back pain only occurs based on how she sleeps. I reviewed her medication. She still uses her gabapentin for her mild neuropathy. She notes she has been on Plavix for a stroke. She notes she did have a hysterectomy in the past.    REVIEW OF SYSTEMS:   Constitutional: Denies fevers, chills or abnormal weight loss Eyes: Denies blurriness of vision Ears, nose, mouth, throat, and face: Denies mucositis or sore throat Respiratory: Denies cough, dyspnea or wheezes Cardiovascular: Denies palpitation, chest discomfort or lower extremity swelling Gastrointestinal:  Denies nausea, heartburn or change in bowel habits Skin: Denies abnormal skin rashes MSK: (+) Mild back pain  Lymphatics: Denies new lymphadenopathy or easy bruising Neurological: (+) Mild neuropathy  Behavioral/Psych: Mood is stable, no new changes  All other systems were reviewed with the patient and are negative.  MEDICAL HISTORY:  Past Medical History:  Diagnosis Date  . Arthritis   . Breast cancer ([place])   . Colon polyps   . Diabetes mellitus without complication ([place])   . GI bleed   . Hyperlipidemia   . Hypertension   . Personal history of chemotherapy   . Personal history of radiation therapy   . Stroke [facility])     SURGICAL HISTORY: Past Surgical History:  Procedure Laterality Date  . ABDOMINAL HYSTERECTOMY     30 years ago  . BREAST BIOPSY    . BREAST LUMPECTOMY Right 08/02/2014  . IR REMOVAL TUN ACCESS W/ PORT W/O FL MOD SED  11/04/2017    I have reviewed the social history and family history with the patient and they are unchanged from previous note.  ALLERGIES:   has No Known Allergies.  MEDICATIONS:  Current Outpatient Medications  Medication Sig Dispense Refill  . anastrozole (ARIMIDEX) 1 MG tablet Take 1 tablet (1 mg total) by mouth daily. 90 tablet 3  . blood glucose meter kit and supplies KIT Dispense based on patient and insurance preference. Use up to four times daily as directed. (FOR ICD-9 250.00, 250.01). 1 each 0  . calcium-vitamin D (OSCAL WITH D) 500-200 MG-UNIT tablet Take 1 tablet by mouth daily with breakfast. 90 tablet 3  . carvedilol (COREG) 6.25 MG tablet TAKE 1 TABLET BY MOUTH  TWICE DAILY WITH A MEAL 180 tablet 3  . clopidogrel (PLAVIX) 75 MG tablet TAKE 1 TABLET BY MOUTH  DAILY 90 tablet 3  . gabapentin (NEURONTIN) 300 MG capsule Take 300 mg by mouth at bedtime.    Marland Kitchen losartan (COZAAR) 100 MG tablet TAKE 1 TABLET BY MOUTH  DAILY 90 tablet 3  . metFORMIN (GLUCOPHAGE) 500 MG tablet TAKE 1 TABLET (500 MG) BY MOUTH TWICE A DAY WITH BREAKFAST AND DINNER. 180 tablet 1  . nystatin (MYCOSTATIN) 100000 UNIT/ML suspension Take 5 mLs (500,000 Units total) by mouth 4 (four) times daily. 60 mL 0  . Nystatin POWD 1 application by Does not apply route 2 (two) times daily. 1  Bottle 0  . ONETOUCH VERIO test strip TEST UP TO FOUR TIMES DAILY AS DIRECTED 350 strip 0  . simvastatin (ZOCOR) 40 MG tablet TAKE 1 TABLET BY MOUTH IN  THE EVENING 90 tablet 3  . tamoxifen (NOLVADEX) 20 MG tablet Take 1 tablet (20 mg total) by mouth daily. 30 tablet 5   Current Facility-Administered Medications  Medication Dose Route Frequency Provider Last Rate Last Admin  . 0.9 %  sodium chloride infusion  500 mL Intravenous Once Irene Shipper, MD        PHYSICAL EXAMINATION: ECOG PERFORMANCE STATUS: 0 - Asymptomatic  Vitals:   08/05/19 1329  BP: (!) 165/76  Pulse: 71  Resp: 18  Temp: 98.3 F (36.8 C)  SpO2: 99%   Filed Weights   08/05/19 1329  Weight: 163 lb 4.8 oz (74.1 kg)    GENERAL:alert, no distress and comfortable SKIN: skin color, texture, turgor  are normal, no rashes or significant lesions EYES: normal, Conjunctiva are pink and non-injected, sclera clear  NECK: supple, thyroid normal size, non-tender, without nodularity LYMPH:  no palpable lymphadenopathy in the cervical, axillary  LUNGS: clear to auscultation and percussion with normal breathing effort HEART: regular rate & rhythm and no murmurs and no lower extremity edema ABDOMEN:abdomen soft, non-tender and normal bowel sounds Musculoskeletal:no cyanosis of digits and no clubbing  NEURO: alert & oriented x 3 with fluent speech, no focal motor/sensory deficits BREAST: S/p right lumpectomy: Surgical incision healed well. No palpable mass, nodules or adenopathy bilaterally. Breast exam benign.   LABORATORY DATA:  I have reviewed the data as listed CBC Latest Ref Rng & Units 08/05/2019 02/08/2019 04/09/2018  WBC 4.0 - 10.5 K/uL 5.0 6.8 6.0  Hemoglobin 12.0 - 15.0 g/dL 12.3 12.6 12.3  Hematocrit 36.0 - 46.0 % 39.2 40.4 39.9  Platelets 150 - 400 K/uL 171 191 202     CMP Latest Ref Rng & Units 08/05/2019 07/29/2019 02/08/2019  Glucose 70 - 99 mg/dL 312(H) 353(H) 212(H)  BUN 8 - 23 mg/dL _0 Creatinine 0.44 - 1.00 mg/dL 1.32(H) 1.13 1.34(H)  Sodium 135 - 145 mmol/L 136 138 138  Potassium 3.5 - 5.1 mmol/L 4.6 5.0 4.3  Chloride 98 - 111 mmol/L 102 102 102  CO2 22 - 32 mmol/L _1 Calcium 8.9 - 10.3 mg/dL 9.3 9.7 9.7  Total Protein 6.5 - 8.1 g/dL 6.9 - 7.4  Total Bilirubin 0.3 - 1.2 mg/dL 0.5 - 0.4  Alkaline Phos 38 - 126 U/L 77 - 81  AST 15 - 41 U/L 13(L) - 14(L)  ALT 0 - 44 U/L 14 - 12      RADIOGRAPHIC STUDIES: I have personally reviewed the radiological images as listed and agreed with the findings in the report. No results found.   ASSESSMENT & PLAN:  Donyae Kohn is a 79 y.o. female with    1. 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 Treated with right  lumpectomy, chemo and radiation. Currently on anastrozole, started in 08/2015. Tolerating well. -Her Bone density is weaker now with osteoporosis. I discussed AI can contribute to this so I discussed switching her to Tamoxifen. She did have stroke in the past but on Plavix now. She did have a hysterectomy in the past so she should tolerate Tamoxifen for an additional 2 years (07/2021). She is agreeable.  -From a breast cancer standpoint she is clinically doing  well. Lab reviewed, her CBC and CMP are within normal limits except BG 312, Cr 1.32. Her physical exam and her 11/2018 mammogram were unremarkable. There is no clinical concern for recurrence. -Continue surveillance. Next mammogram in 11/2019 -Due to her osteoporosis, I recommend her change anastrozole to tamoxifen.  Potential benefit (improve bone density) and side effects, which include but not limited to, hot flashes, vaginal discharge, slow metabolism related hyperglycemia, hyperlipidemia, weight gain, mood swing, risk of thrombosis, mood swing, etc., were discussed with patient in details she agrees to proceed.  She has had hysterectomy, no risk of endometrial cancer.  She is also on Plavix, risk of thrombosis from tamoxifen is probably less. -Phone visit in 3 months    2. Osteoporosis  -Her DEXA from 05/12/19 shows osteoporosis with lowest T-score at -2.7 at right hip. Scan before was 4-5 years ago in [place]. She was not told it was abnormal then.  -I educated her about how anastrozole can reduce bone density. I discussed switching her to Tamoxifen which can strengthen her bone density, she is agreeable.  -I encouraged her to continue her calcium with Vitamin D. I refilled today (08/05/19).  -Due to her CKD, will hold on biphosphonate for now. -We will change anastrozole to tamoxifen.  May consider Prolia injection in the future   3. HTN,DM, mild neuropathy  -f/u with PCP and continue medications  -She has mild neuropathy which is  managed with Gabapentin.  -Hb A1c at 8.6 on 05/08/18 and increased to 12.9 on 07/29/19. Her BG 312 today (08/05/19) -She recently saw her PCP and received a new Glucometer and increased her Metformin dose. -I encouraged her to drink more water.   4. CKD  -She has developed mild renal insufficiency, probably related to diabetes and hypertension -I encouraged her to drink fluids adequately, and follow-up with her PCP -Stable.    Plan -I refilled Calcium with Vit D and Tamoxifen today.  She will stop anastrozole and switch to tamoxifen when she receives it  -Mammogram in 11/2019 -Virtual visit in 3 months, lab and follow-up in 6 months.    No problem-specific Assessment & Plan notes found for this encounter.   Orders Placed This Encounter  Procedures  . MM DIAG BREAST TOMO BILATERAL    Standing Status:   Future    Standing Expiration Date:   08/04/2020    Order Specific Question:   Reason for Exam (SYMPTOM  OR DIAGNOSIS REQUIRED)    Answer:   screening    Order Specific Question:   Preferred imaging location?    Answer:   [facility]   All questions were answered. The patient knows to call the clinic with any problems, questions or concerns. No barriers to learning was detected. The total time spent in the appointment was 30 minutes.     Truitt Merle, MD 08/05/2019   I, Joslyn Devon, am acting as scribe for Truitt Merle, MD.   I have reviewed the above documentation for accuracy and completeness, and I agree with the above.

## 2019-08-04 ENCOUNTER — Other Ambulatory Visit: Payer: Self-pay

## 2019-08-05 ENCOUNTER — Ambulatory Visit: Payer: Medicare Other | Attending: Internal Medicine

## 2019-08-05 ENCOUNTER — Other Ambulatory Visit: Payer: Self-pay

## 2019-08-05 ENCOUNTER — Encounter: Payer: Self-pay | Admitting: Hematology

## 2019-08-05 ENCOUNTER — Inpatient Hospital Stay: Payer: Medicare Other | Attending: Hematology

## 2019-08-05 ENCOUNTER — Inpatient Hospital Stay (HOSPITAL_BASED_OUTPATIENT_CLINIC_OR_DEPARTMENT_OTHER): Payer: Medicare Other | Admitting: Hematology

## 2019-08-05 VITALS — BP 165/76 | HR 71 | Temp 98.3°F | Resp 18 | Ht 63.0 in | Wt 163.3 lb

## 2019-08-05 DIAGNOSIS — I129 Hypertensive chronic kidney disease with stage 1 through stage 4 chronic kidney disease, or unspecified chronic kidney disease: Secondary | ICD-10-CM | POA: Diagnosis not present

## 2019-08-05 DIAGNOSIS — Z9221 Personal history of antineoplastic chemotherapy: Secondary | ICD-10-CM | POA: Insufficient documentation

## 2019-08-05 DIAGNOSIS — Z923 Personal history of irradiation: Secondary | ICD-10-CM | POA: Insufficient documentation

## 2019-08-05 DIAGNOSIS — Z79811 Long term (current) use of aromatase inhibitors: Secondary | ICD-10-CM | POA: Insufficient documentation

## 2019-08-05 DIAGNOSIS — Z170 Estrogen receptor positive status [ER+]: Secondary | ICD-10-CM | POA: Diagnosis not present

## 2019-08-05 DIAGNOSIS — Z7984 Long term (current) use of oral hypoglycemic drugs: Secondary | ICD-10-CM | POA: Insufficient documentation

## 2019-08-05 DIAGNOSIS — Z79899 Other long term (current) drug therapy: Secondary | ICD-10-CM | POA: Diagnosis not present

## 2019-08-05 DIAGNOSIS — C50211 Malignant neoplasm of upper-inner quadrant of right female breast: Secondary | ICD-10-CM | POA: Diagnosis not present

## 2019-08-05 DIAGNOSIS — Z7902 Long term (current) use of antithrombotics/antiplatelets: Secondary | ICD-10-CM | POA: Insufficient documentation

## 2019-08-05 DIAGNOSIS — Z23 Encounter for immunization: Secondary | ICD-10-CM

## 2019-08-05 DIAGNOSIS — G8929 Other chronic pain: Secondary | ICD-10-CM | POA: Diagnosis not present

## 2019-08-05 DIAGNOSIS — M810 Age-related osteoporosis without current pathological fracture: Secondary | ICD-10-CM | POA: Diagnosis not present

## 2019-08-05 DIAGNOSIS — E785 Hyperlipidemia, unspecified: Secondary | ICD-10-CM | POA: Diagnosis not present

## 2019-08-05 DIAGNOSIS — E1140 Type 2 diabetes mellitus with diabetic neuropathy, unspecified: Secondary | ICD-10-CM | POA: Insufficient documentation

## 2019-08-05 DIAGNOSIS — E1122 Type 2 diabetes mellitus with diabetic chronic kidney disease: Secondary | ICD-10-CM | POA: Diagnosis not present

## 2019-08-05 DIAGNOSIS — M549 Dorsalgia, unspecified: Secondary | ICD-10-CM | POA: Insufficient documentation

## 2019-08-05 DIAGNOSIS — C50911 Malignant neoplasm of unspecified site of right female breast: Secondary | ICD-10-CM

## 2019-08-05 LAB — COMPREHENSIVE METABOLIC PANEL
ALT: 14 U/L (ref 0–44)
AST: 13 U/L — ABNORMAL LOW (ref 15–41)
Albumin: 3.5 g/dL (ref 3.5–5.0)
Alkaline Phosphatase: 77 U/L (ref 38–126)
Anion gap: 7 (ref 5–15)
BUN: 12 mg/dL (ref 8–23)
CO2: 27 mmol/L (ref 22–32)
Calcium: 9.3 mg/dL (ref 8.9–10.3)
Chloride: 102 mmol/L (ref 98–111)
Creatinine, Ser: 1.32 mg/dL — ABNORMAL HIGH (ref 0.44–1.00)
GFR calc Af Amer: 44 mL/min — ABNORMAL LOW (ref >60)
GFR calc non Af Amer: 38 mL/min — ABNORMAL LOW (ref >60)
Glucose, Bld: 312 mg/dL — ABNORMAL HIGH (ref 70–99)
Potassium: 4.6 mmol/L (ref 3.5–5.1)
Sodium: 136 mmol/L (ref 135–145)
Total Bilirubin: 0.5 mg/dL (ref 0.3–1.2)
Total Protein: 6.9 g/dL (ref 6.5–8.1)

## 2019-08-05 LAB — CBC WITH DIFFERENTIAL/PLATELET
Abs Immature Granulocytes: 0.01 10*3/uL (ref 0.00–0.07)
Basophils Absolute: 0 10*3/uL (ref 0.0–0.1)
Basophils Relative: 0 %
Eosinophils Absolute: 0.1 10*3/uL (ref 0.0–0.5)
Eosinophils Relative: 2 %
HCT: 39.2 % (ref 36.0–46.0)
Hemoglobin: 12.3 g/dL (ref 12.0–15.0)
Immature Granulocytes: 0 %
Lymphocytes Relative: 23 %
Lymphs Abs: 1.2 10*3/uL (ref 0.7–4.0)
MCH: 27.2 pg (ref 26.0–34.0)
MCHC: 31.4 g/dL (ref 30.0–36.0)
MCV: 86.5 fL (ref 80.0–100.0)
Monocytes Absolute: 0.5 10*3/uL (ref 0.1–1.0)
Monocytes Relative: 10 %
Neutro Abs: 3.3 10*3/uL (ref 1.7–7.7)
Neutrophils Relative %: 65 %
Platelets: 171 10*3/uL (ref 150–400)
RBC: 4.53 MIL/uL (ref 3.87–5.11)
RDW: 14 % (ref 11.5–15.5)
WBC: 5 10*3/uL (ref 4.0–10.5)
nRBC: 0 % (ref 0.0–0.2)

## 2019-08-05 MED ORDER — CALCIUM CARBONATE-VITAMIN D 500-200 MG-UNIT PO TABS: 1.0000 | ORAL_TABLET | Freq: Every day | ORAL | 3 refills | Status: DC

## 2019-08-05 MED ORDER — TAMOXIFEN CITRATE 20 MG PO TABS: 20.0000 mg | ORAL_TABLET | Freq: Every day | ORAL | 5 refills | Status: DC

## 2019-08-05 NOTE — Progress Notes (Signed)
   Covid-19 Vaccination Clinic  Name:  Jasmine Carlson    MRN: [id number] DOB: 1940-10-17  08/05/2019  [name] was observed post Covid-19 immunization for 15 minutes without incident. She was provided with Vaccine Information Sheet and instruction to access the V-Safe system.   [name] was instructed to call 911 with any severe reactions post vaccine: Marland Kitchen Difficulty breathing  . Swelling of face and throat  . A fast heartbeat  . A bad rash all over body  . Dizziness and weakness   Immunizations Administered    Name Date Dose VIS Date Route   Pfizer COVID-19 Vaccine 08/05/2019  4:04 PM 0.3 mL 04/09/2019 Intramuscular   Manufacturer: [place]   Lot: [id number]   [place]: [id number]

## 2019-08-06 ENCOUNTER — Telehealth: Payer: Self-pay | Admitting: Hematology

## 2019-08-06 NOTE — Telephone Encounter (Signed)
Scheduled appt per 4/8 los.  Spoke wit pt and she is aware of the appt date and time

## 2019-08-09 ENCOUNTER — Other Ambulatory Visit: Payer: 59

## 2019-08-09 ENCOUNTER — Ambulatory Visit: Payer: 59 | Admitting: Hematology

## 2019-08-16 DIAGNOSIS — H353131 Nonexudative age-related macular degeneration, bilateral, early dry stage: Secondary | ICD-10-CM | POA: Diagnosis not present

## 2019-08-16 DIAGNOSIS — E119 Type 2 diabetes mellitus without complications: Secondary | ICD-10-CM | POA: Diagnosis not present

## 2019-08-16 DIAGNOSIS — H25813 Combined forms of age-related cataract, bilateral: Secondary | ICD-10-CM | POA: Diagnosis not present

## 2019-08-16 LAB — HM DIABETES EYE EXAM

## 2019-08-17 ENCOUNTER — Encounter: Payer: Self-pay | Admitting: Family Medicine

## 2019-08-21 ENCOUNTER — Other Ambulatory Visit: Payer: Self-pay | Admitting: Family Medicine

## 2019-08-23 ENCOUNTER — Other Ambulatory Visit: Payer: Self-pay

## 2019-08-23 DIAGNOSIS — C50211 Malignant neoplasm of upper-inner quadrant of right female breast: Secondary | ICD-10-CM

## 2019-08-30 ENCOUNTER — Ambulatory Visit: Payer: Medicare Other | Attending: Internal Medicine

## 2019-08-30 DIAGNOSIS — Z23 Encounter for immunization: Secondary | ICD-10-CM

## 2019-08-30 NOTE — Progress Notes (Signed)
   Covid-19 Vaccination Clinic  Name:  Rheanne Maggiore    MRN: [id number] DOB: 1941/03/13  08/30/2019  Ms. Messman was observed post Covid-19 immunization for 15 minutes without incident. She was provided with Vaccine Information Sheet and instruction to access the V-Safe system.   Ms. Sanantonio was instructed to call 911 with any severe reactions post vaccine: Marland Kitchen Difficulty breathing  . Swelling of face and throat  . A fast heartbeat  . A bad rash all over body  . Dizziness and weakness   Immunizations Administered    Name Date Dose VIS Date [place] COVID-19 Vaccine 08/30/2019 10:44 AM 0.3 mL 06/23/2018 Intramuscular   Manufacturer: [place]   Lot: [id number]   [place]: [id number]

## 2019-09-15 DIAGNOSIS — M25562 Pain in left knee: Secondary | ICD-10-CM | POA: Diagnosis not present

## 2019-09-15 DIAGNOSIS — M170 Bilateral primary osteoarthritis of knee: Secondary | ICD-10-CM | POA: Diagnosis not present

## 2019-09-15 DIAGNOSIS — M25561 Pain in right knee: Secondary | ICD-10-CM | POA: Diagnosis not present

## 2019-09-15 DIAGNOSIS — R6889 Other general symptoms and signs: Secondary | ICD-10-CM | POA: Diagnosis not present

## 2019-09-22 DIAGNOSIS — M25562 Pain in left knee: Secondary | ICD-10-CM | POA: Diagnosis not present

## 2019-09-22 DIAGNOSIS — R6889 Other general symptoms and signs: Secondary | ICD-10-CM | POA: Diagnosis not present

## 2019-09-22 DIAGNOSIS — M25561 Pain in right knee: Secondary | ICD-10-CM | POA: Diagnosis not present

## 2019-09-22 DIAGNOSIS — M170 Bilateral primary osteoarthritis of knee: Secondary | ICD-10-CM | POA: Diagnosis not present

## 2019-09-30 DIAGNOSIS — M25561 Pain in right knee: Secondary | ICD-10-CM | POA: Diagnosis not present

## 2019-09-30 DIAGNOSIS — M25562 Pain in left knee: Secondary | ICD-10-CM | POA: Diagnosis not present

## 2019-09-30 DIAGNOSIS — R6889 Other general symptoms and signs: Secondary | ICD-10-CM | POA: Diagnosis not present

## 2019-09-30 DIAGNOSIS — R269 Unspecified abnormalities of gait and mobility: Secondary | ICD-10-CM | POA: Diagnosis not present

## 2019-09-30 DIAGNOSIS — M170 Bilateral primary osteoarthritis of knee: Secondary | ICD-10-CM | POA: Diagnosis not present

## 2019-10-29 NOTE — Progress Notes (Signed)
[place]   Telephone:(336) [phone] Fax:(336) [phone]   Clinic Follow up Note   Patient Care Team: Jasmine Ruddy, MD as PCP - General (Family Medicine)   I connected with Jasmine Carlson on 10/29/2019 at  9:40 AM EDT by telephone visit and verified that I am speaking with the correct person using two identifiers.  I discussed the limitations, risks, security and privacy concerns of performing an evaluation and management service by telephone and the availability of in person appointments. I also discussed with the patient that there may be a patient responsible charge related to this service. The patient expressed understanding and agreed to proceed.   Other persons participating in the visit and their role in the encounter: none   Patient's location:  Home  Provider's location:  Office   CHIEF COMPLAINT: F/u of right breast cancer  SUMMARY OF ONCOLOGIC HISTORY: Oncology History Overview Note  Cancer Staging Breast cancer of upper-inner quadrant of right female breast [facility]) Staging form: Breast, AJCC 8th Edition - Pathologic stage from 08/02/2014: Stage IA (pT1c, pN0, cM0, G2, ER: Positive, PR: Positive, HER2: Positive) - Signed by Jasmine Merle, MD on 01/04/2017 - Clinical: No stage assigned - Unsigned     Breast cancer of upper-inner quadrant of right female breast ([place])  05/25/2014 Mammogram   Suspicious lesion at 3 o'clock position in the right breast   06/15/2014 Breast US   Suspicious nodule, a spiculated hypoechoic mass, 1.1 cm at the 1 o'clock position   06/30/2014 Initial Biopsy   US Guided Core Biopsy: Invasive Ductal Carcinoma, Nottingham Grade 2. Addendum: ER positive (100%), PR positive (75-80%), HER2 positive    06/30/2014 Initial Diagnosis   Breast cancer of upper-inner quadrant of right female breast ([place]) from Right Breast Lumpectomy: Invasive Ductal Carcinoma, Elston Grade 2, surgical margins free of carcinoma; Sentinel nodes 2/2 free of tumor     08/02/2014 Surgery   Right Breast Lumpectomy with SLN biopsy; Attending physician Dr. Tamera Carlson at [facility] in [place], [place]   08/02/2014 Pathology Results   Right breast lumpectomy: Invasive ductal carcinoma, Jasmine Carlson Grade 2; size or largest invasive carcinoma 1.4 cm; Sentinel lymph nodes 2/2 free of tumor, margins free of tumor    09/22/2014 Echocardiogram   MUGA scan: normal ejection fraction of 57.7%   10/01/2014 - 10/03/2015 Adjuvant Chemotherapy   Adjuvant weekly Taxol from 11/28/97 to 07/04/14 complicated by fatigue, diarrhea, and leukopenia requiring treatment delays and dosage adjustments. Records indicate she received 8 of 12 doses. Completed adjuvant Herceptin q3 weeks from 10/01/14 to 10/03/15.    06/12/2015 - 08/04/2015 Radiation Therapy   Received 4,860 cGy to right breast in 27 fractions Received 1,600 cGy boost in 8 fractions    09/12/2015 -  Anti-estrogen oral therapy   She began Arimidex after completing adjuvant radiation   06/25/2016 Imaging   CT Chest, Abdomen, Pelvis with contrast, compared with PET/CT on 04/25/2015 and CT 09/06/2014 - Impression: Chest: 1. Unchanged multiple subcentimeter pulmonary nodules bilaterally.there are no new pulmonary nodules. 2. No thoracic lymphadenopathy. 3. New right lung post radiation changes. 4. New diffuse right breast skin thickening suggestive of post radiation changes. Abdomen/pelvis 1. No new mass or lymphadenopathy in the abdomen and pelvis, no CT evidence of metastatic disease. 2. Unchanged adjacent soft tissue nodules inferior to the right hepatic lobe. 3. Colonic diverticulosis. 4. Status post hysterectomy.   06/29/2016 Mammogram   Digital bilateral mammogram with CAD and TOMO findings: the breast parenchyma demonstrates scattered  fibroglandular densities. There are no suspicious calcifications, masses, architectural distortion or skin thickening. Surgical clips are present in the right breast at posterior depth. Vascular  calcifications are present. No mammographic evidence of malignancy or referred is is gravida   12/23/2017 Mammogram   12/23/2017 Mammogram IMPRESSION: No mammographic evidence of malignancy in either breast, status post right lumpectomy.   12/25/2018 Mammogram   1. No evidence of new or recurrent breast carcinoma. 2. Benign postsurgical changes on the right.      CURRENT THERAPY:  Anastrozole 59m daily starting 09/12/15. Due to osteoporosis I switched her to Tamoxifen for 2 additional years in 07/2019.  INTERVAL HISTORY:  REmmy Carlson scheduled for a phone visit.  She is doing very well, she is very excited about her blood glucose has been decreased lately.  She states she has been watching her diet very carefully.  Her blood glucose was 123 today.  She has been also drinking more water.  She started tamoxifen in April 2021, tolerating well, no noticeable side effects.  She otherwise doing well.  Review of systems negative.   MEDICAL HISTORY:  Past Medical History:  Diagnosis Date  . Arthritis   . [place]   . Colon polyps   . [place] complication [place]   . GI bleed   . Hyperlipidemia   . Hypertension   . Personal history of chemotherapy   . Personal history of radiation therapy   . Stroke ([facility]     SURGICAL HISTORY: Past Surgical History:  Procedure Laterality Date  . ABDOMINAL HYSTERECTOMY     30 years ago  . BREAST BIOPSY    . BREAST LUMPECTOMY Right 08/02/2014  . IR REMOVAL TUN ACCESS W/ PORT W/O FL MOD SED  11/04/2017    I have reviewed the social history and family history with the patient and they are unchanged from previous note.  ALLERGIES:  has No Known Allergies.  MEDICATIONS:  Current Outpatient Medications  Medication Sig Dispense Refill  . anastrozole (ARIMIDEX) 1 MG tablet Take 1 tablet (1 mg total) by mouth daily. 90 tablet 3  . blood glucose meter kit and supplies KIT Dispense based on patient and insurance preference.  Use up to four times daily as directed. (FOR ICD-9 250.00, 250.01). 1 each 0  . calcium-vitamin D (OSCAL WITH D) 500-200 MG-UNIT tablet Take 1 tablet by mouth daily with breakfast. 90 tablet 3  . carvedilol (COREG) 6.25 MG tablet TAKE 1 TABLET BY MOUTH  TWICE DAILY WITH A MEAL 180 tablet 3  . clopidogrel (PLAVIX) 75 MG tablet TAKE 1 TABLET BY MOUTH  DAILY 90 tablet 3  . gabapentin (NEURONTIN) 300 MG capsule Take 300 mg by mouth at bedtime.    .Marland Kitchenlosartan (COZAAR) 100 MG tablet TAKE 1 TABLET BY MOUTH  DAILY 90 tablet 3  . metFORMIN (GLUCOPHAGE) 500 MG tablet TAKE 1 TABLET (500 MG) BY MOUTH TWICE A DAY WITH BREAKFAST AND DINNER. 180 tablet 1  . nystatin (MYCOSTATIN) 100000 UNIT/ML suspension Take 5 mLs (500,000 Units total) by mouth 4 (four) times daily. 60 mL 0  . Nystatin POWD 1 application by Does not apply route 2 (two) times daily. 1 Bottle 0  . OneTouch Delica Lancets 338SMISC TEST UP TO FOUR TIMES DAILY AS DIRECTED 100 each 5  . ONETOUCH VERIO test strip TEST UP TO FOUR TIMES DAILY AS DIRECTED 350 strip 5  . simvastatin (ZOCOR) 40 MG tablet TAKE 1 TABLET BY MOUTH IN  THE EVENING 90 tablet 3  . tamoxifen (NOLVADEX) 20 MG tablet Take 1 tablet (20 mg total) by mouth daily. 30 tablet 5   Current Facility-Administered Medications  Medication Dose Route Frequency Provider Last Rate Last Admin  . 0.9 %  sodium chloride infusion  500 mL Intravenous Once Irene Shipper, MD        PHYSICAL EXAMINATION: ECOG PERFORMANCE STATUS: 0 - Asymptomatic  No vitals taken today, Exam not performed today   LABORATORY DATA:  I have reviewed the data as listed CBC Latest Ref Rng & Units 08/05/2019 02/08/2019 04/09/2018  WBC 4.0 - 10.5 K/uL 5.0 6.8 6.0  Hemoglobin 12.0 - 15.0 g/dL 12.3 12.6 12.3  Hematocrit 36 - 46 % 39.2 40.4 39.9  Platelets 150 - 400 K/uL 171 191 202     CMP Latest Ref Rng & Units 08/05/2019 07/29/2019 02/08/2019  Glucose 70 - 99 mg/dL 312(H) 353(H) 212(H)  BUN 8 - 23 mg/dL '12 17 21    ' Creatinine 0.44 - 1.00 mg/dL 1.32(H) 1.13 1.34(H)  Sodium 135 - 145 mmol/L 136 138 138  Potassium 3.5 - 5.1 mmol/L 4.6 5.0 4.3  Chloride 98 - 111 mmol/L 102 102 102  CO2 22 - 32 mmol/L '27 29 27  ' Calcium 8.9 - 10.3 mg/dL 9.3 9.7 9.7  Total Protein 6.5 - 8.1 g/dL 6.9 - 7.4  Total Bilirubin 0.3 - 1.2 mg/dL 0.5 - 0.4  Alkaline Phos 38 - 126 U/L 77 - 81  AST 15 - 41 U/L 13(L) - 14(L)  ALT 0 - 44 U/L 14 - 12      RADIOGRAPHIC STUDIES: I have personally reviewed the radiological images as listed and agreed with the findings in the report. No results found.   ASSESSMENT & PLAN:  Jawanna Dykman is a 79 y.o. female with   1. 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 Treated withrightlumpectomy, chemo and radiation. -She started antiestrogen therapy with anastrozole in 08/2015. Due to osteoporosis I started switched her to Tamoxifen for an additional 2 years in 07/2019. She is tolerating well  -She is clinically doing well no concerns for cancer recurrence -We will see her back in 01/2020 as scheduled  -Continue surveillance. Next mammogram in 11/2019   2. Osteoporosis  -Her DEXA from 05/12/19 shows osteoporosis with lowest T-score at -2.7 at right hip. Scan before was 4-5 years ago in [place]. She was not told it was abnormal then.  -I educated her about how anastrozole can reduce bone density. So I switched her to Tamoxifen which can strengthen her bone density.  -I encouraged her to continue her calcium with Vitamin D. -Due to her CKD, will hold on biphosphonate for now. May consider Prolia injection in the future   3. HTN,DM, mild neuropathy  -f/u with PCP and continue medications  -She has mild neuropathy which is managed with Gabapentin.  -DM has better controlled lately   4. CKD  -She has developed mild renal insufficiency, probably related to diabetes and hypertension -I  encouraged her to drink fluids adequately, and follow-up with her PCP   Plan -She is tolerating tamoxifen well, will continue -Lab and follow-up in October as scheduled  No problem-specific Assessment & Plan notes found for this encounter.   No orders of the defined types were placed in this encounter.  I discussed the assessment and treatment plan with the patient. The patient was provided an opportunity  to ask questions and all were answered. The patient agreed with the plan and demonstrated an understanding of the instructions.  The patient was advised to call back or seek an in-person evaluation if the symptoms worsen or if the condition fails to improve as anticipated.  The total time spent in the appointment was 15 minutes.    Jasmine Merle, MD 10/29/2019   I, Joslyn Devon, am acting as scribe for Jasmine Merle, MD.   I have reviewed the above documentation for accuracy and completeness, and I agree with the above.

## 2019-11-04 ENCOUNTER — Telehealth: Payer: Self-pay | Admitting: Hematology

## 2019-11-05 ENCOUNTER — Inpatient Hospital Stay: Payer: Medicare Other | Attending: Hematology | Admitting: Hematology

## 2019-11-05 ENCOUNTER — Encounter: Payer: Self-pay | Admitting: Hematology

## 2019-11-05 DIAGNOSIS — C50211 Malignant neoplasm of upper-inner quadrant of right female breast: Secondary | ICD-10-CM

## 2019-11-05 DIAGNOSIS — Z170 Estrogen receptor positive status [ER+]: Secondary | ICD-10-CM

## 2019-11-05 DIAGNOSIS — I10 Essential (primary) hypertension: Secondary | ICD-10-CM | POA: Diagnosis not present

## 2019-11-05 DIAGNOSIS — Z8673 Personal history of transient ischemic attack (TIA), and cerebral infarction without residual deficits: Secondary | ICD-10-CM

## 2019-11-08 ENCOUNTER — Telehealth: Payer: Self-pay | Admitting: Hematology

## 2019-11-08 NOTE — Telephone Encounter (Signed)
F/u scheduled per 7/9 los

## 2019-11-22 ENCOUNTER — Other Ambulatory Visit: Payer: Self-pay | Admitting: Hematology

## 2020-01-01 DIAGNOSIS — E119 Type 2 diabetes mellitus without complications: Secondary | ICD-10-CM | POA: Diagnosis not present

## 2020-01-01 DIAGNOSIS — H40033 Anatomical narrow angle, bilateral: Secondary | ICD-10-CM | POA: Diagnosis not present

## 2020-01-06 DIAGNOSIS — M25561 Pain in right knee: Secondary | ICD-10-CM | POA: Diagnosis not present

## 2020-01-06 DIAGNOSIS — M170 Bilateral primary osteoarthritis of knee: Secondary | ICD-10-CM | POA: Diagnosis not present

## 2020-01-06 DIAGNOSIS — M25562 Pain in left knee: Secondary | ICD-10-CM | POA: Diagnosis not present

## 2020-01-07 ENCOUNTER — Other Ambulatory Visit: Payer: Self-pay

## 2020-01-07 ENCOUNTER — Ambulatory Visit
Admission: RE | Admit: 2020-01-07 | Discharge: 2020-01-07 | Disposition: A | Discharge status: Home / Self Care | Payer: Medicare Other | Source: Ambulatory Visit | Attending: Hematology | Admitting: Hematology

## 2020-01-07 DIAGNOSIS — C50211 Malignant neoplasm of upper-inner quadrant of right female breast: Secondary | ICD-10-CM

## 2020-01-07 DIAGNOSIS — R928 Other abnormal and inconclusive findings on diagnostic imaging of breast: Secondary | ICD-10-CM | POA: Diagnosis not present

## 2020-01-07 DIAGNOSIS — Z170 Estrogen receptor positive status [ER+]: Secondary | ICD-10-CM

## 2020-01-07 DIAGNOSIS — Z853 Personal history of malignant neoplasm of breast: Secondary | ICD-10-CM | POA: Diagnosis not present

## 2020-02-02 NOTE — Progress Notes (Signed)
[place]   Telephone:(336) [phone] Fax:(336) [phone]   Clinic Follow up Note   Patient Care Team: [name], MD as PCP - General (Family Medicine)  Date of Service:  02/04/2020  CHIEF COMPLAINT: F/u of right breast cancer  SUMMARY OF ONCOLOGIC HISTORY: Oncology History Overview Note  Cancer Staging Breast cancer of upper-inner quadrant of right female breast [facility]) Staging form: Breast, AJCC 8th Edition - Pathologic stage from 08/02/2014: Stage IA (pT1c, pN0, cM0, G2, ER: Positive, PR: Positive, HER2: Positive) - Signed by Truitt Merle, MD on 01/04/2017 - Clinical: No stage assigned - Unsigned     Breast cancer of upper-inner quadrant of right female breast ([place])  05/25/2014 Mammogram   Suspicious lesion at 3 o'clock position in the right breast   06/15/2014 Breast US   Suspicious nodule, a spiculated hypoechoic mass, 1.1 cm at the 1 o'clock position   06/30/2014 Initial Biopsy   US Guided Core Biopsy: Invasive Ductal Carcinoma, Nottingham Grade 2. Addendum: ER positive (100%), PR positive (75-80%), HER2 positive    06/30/2014 Initial Diagnosis   Breast cancer of upper-inner quadrant of right female breast ([place]) from Right Breast Lumpectomy: Invasive Ductal Carcinoma, Elston Grade 2, surgical margins free of carcinoma; Sentinel nodes 2/2 free of tumor   08/02/2014 Surgery   Right Breast Lumpectomy with SLN biopsy; Attending physician Dr. Tamera Punt at [facility] in [place], [place]   08/02/2014 Pathology Results   Right breast lumpectomy: Invasive ductal carcinoma, Duanne Moron Grade 2; size or largest invasive carcinoma 1.4 cm; Sentinel lymph nodes 2/2 free of tumor, margins free of tumor    09/22/2014 Echocardiogram   MUGA scan: normal ejection fraction of 57.7%   10/01/2014 - 10/03/2015 Adjuvant Chemotherapy   Adjuvant weekly Taxol from 01/04/25 to 07/28/56 complicated by fatigue, diarrhea, and leukopenia requiring treatment delays and dosage adjustments.  Records indicate she received 8 of 12 doses. Completed adjuvant Herceptin q3 weeks from 10/01/14 to 10/03/15.    06/12/2015 - 08/04/2015 Radiation Therapy   Received 4,860 cGy to right breast in 27 fractions Received 1,600 cGy boost in 8 fractions    09/12/2015 -  Anti-estrogen oral therapy   Anastrozole 40m daily starting 09/12/15. Due to osteoporosis I switched her to Tamoxifen for 2 additional years in 07/2019. Plan to complete in 2024.    06/25/2016 Imaging   CT Chest, Abdomen, Pelvis with contrast, compared with PET/CT on 04/25/2015 and CT 09/06/2014 - Impression: Chest: 1. Unchanged multiple subcentimeter pulmonary nodules bilaterally.there are no new pulmonary nodules. 2. No thoracic lymphadenopathy. 3. New right lung post radiation changes. 4. New diffuse right breast skin thickening suggestive of post radiation changes. Abdomen/pelvis 1. No new mass or lymphadenopathy in the abdomen and pelvis, no CT evidence of metastatic disease. 2. Unchanged adjacent soft tissue nodules inferior to the right hepatic lobe. 3. Colonic diverticulosis. 4. Status post hysterectomy.   06/29/2016 Mammogram   Digital bilateral mammogram with CAD and TOMO findings: the breast parenchyma demonstrates scattered fibroglandular densities. There are no suspicious calcifications, masses, architectural distortion or skin thickening. Surgical clips are present in the right breast at posterior depth. Vascular calcifications are present. No mammographic evidence of malignancy or referred is is gravida   12/23/2017 Mammogram   12/23/2017 Mammogram IMPRESSION: No mammographic evidence of malignancy in either breast, status post right lumpectomy.   12/25/2018 Mammogram   1. No evidence of new or recurrent breast carcinoma. 2. Benign postsurgical changes on the right.      CURRENT  THERAPY:  Anastrozole 16m daily starting 09/12/15. Due to osteoporosis I switched her to Tamoxifen for 2 additional years in 07/2019. Plan to  complete in 2024.   INTERVAL HISTORY:  Jasmine Carlson here for a follow up of left breast cancer. She presents to the clinic alone. She notes she is doing well. She notes her mammogram imaging location said she did not need anymore mammograms given she has been getting them for 5 years. She notes she continues to f/u with her PCP often. She notes she had both COVID vaccines but does not plan to get the flu shot.     REVIEW OF SYSTEMS:   Constitutional: Denies fevers, chills or abnormal weight loss Eyes: Denies blurriness of vision Ears, nose, mouth, throat, and face: Denies mucositis or sore throat Respiratory: Denies cough, dyspnea or wheezes Cardiovascular: Denies palpitation, chest discomfort or lower extremity swelling Gastrointestinal:  Denies nausea, heartburn or change in bowel habits Skin: Denies abnormal skin rashes MSK: (+) Joint pain in knees, arthritis  Lymphatics: Denies new lymphadenopathy or easy bruising Neurological:Denies numbness, tingling or new weaknesses Behavioral/Psych: Mood is stable, no new changes  All other systems were reviewed with the patient and are negative.  MEDICAL HISTORY:  Past Medical History:  Diagnosis Date  . Arthritis   . Breast cancer ([place]   . Colon polyps   . Diabetes mellitus without complication ([place]   . GI bleed   . Hyperlipidemia   . Hypertension   . Personal history of chemotherapy   . Personal history of radiation therapy   . Stroke ([facility]     SURGICAL HISTORY: Past Surgical History:  Procedure Laterality Date  . ABDOMINAL HYSTERECTOMY     30 years ago  . BREAST BIOPSY    . BREAST LUMPECTOMY Right 08/02/2014  . IR REMOVAL TUN ACCESS W/ PORT W/O FL MOD SED  11/04/2017    I have reviewed the social history and family history with the patient and they are unchanged from previous note.  ALLERGIES:  has No Known Allergies.  MEDICATIONS:  Current Outpatient Medications  Medication Sig Dispense Refill  . anastrozole  (ARIMIDEX) 1 MG tablet Take 1 tablet (1 mg total) by mouth daily. 90 tablet 3  . blood glucose meter kit and supplies KIT Dispense based on patient and insurance preference. Use up to four times daily as directed. (FOR ICD-9 250.00, 250.01). 1 each 0  . calcium-vitamin D (OSCAL WITH D) 500-200 MG-UNIT tablet Take 1 tablet by mouth daily with breakfast. 90 tablet 3  . carvedilol (COREG) 6.25 MG tablet TAKE 1 TABLET BY MOUTH  TWICE DAILY WITH A MEAL 180 tablet 3  . clopidogrel (PLAVIX) 75 MG tablet TAKE 1 TABLET BY MOUTH  DAILY 90 tablet 3  . gabapentin (NEURONTIN) 300 MG capsule Take 300 mg by mouth at bedtime.    .Marland Kitchenlosartan (COZAAR) 100 MG tablet TAKE 1 TABLET BY MOUTH  DAILY 90 tablet 3  . metFORMIN (GLUCOPHAGE) 500 MG tablet TAKE 1 TABLET (500 MG) BY MOUTH TWICE A DAY WITH BREAKFAST AND DINNER. 180 tablet 1  . nystatin (MYCOSTATIN) 100000 UNIT/ML suspension Take 5 mLs (500,000 Units total) by mouth 4 (four) times daily. 60 mL 0  . Nystatin POWD 1 application by Does not apply route 2 (two) times daily. 1 Bottle 0  . OneTouch Delica Lancets 382NMISC TEST UP TO FOUR TIMES DAILY AS DIRECTED 100 each 5  . ONETOUCH VERIO test strip TEST UP TO FOUR TIMES DAILY  AS DIRECTED 350 strip 5  . simvastatin (ZOCOR) 40 MG tablet TAKE 1 TABLET BY MOUTH IN  THE EVENING 90 tablet 3  . tamoxifen (NOLVADEX) 20 MG tablet TAKE 1 TABLET BY MOUTH  DAILY 90 tablet 3   Current Facility-Administered Medications  Medication Dose Route Frequency Provider Last Rate Last Admin  . 0.9 %  sodium chloride infusion  500 mL Intravenous Once Irene Shipper, MD        PHYSICAL EXAMINATION: ECOG PERFORMANCE STATUS: 0 - Asymptomatic  Vitals:   02/04/20 0939  BP: (!) 184/83  Pulse: 75  Resp: 18  Temp: (!) 95 F (35 C)  SpO2: 100%   Filed Weights   02/04/20 0939  Weight: 161 lb 14.4 oz (73.4 kg)    GENERAL:alert, no distress and comfortable SKIN: skin color, texture, turgor are normal, no rashes or significant  lesions EYES: normal, Conjunctiva are pink and non-injected, sclera clear  NECK: supple, thyroid normal size, non-tender, without nodularity LYMPH:  no palpable lymphadenopathy in the cervical, axillary  LUNGS: clear to auscultation and percussion with normal breathing effort HEART: regular rate & rhythm and no murmurs and no lower extremity edema ABDOMEN:abdomen soft, non-tender and normal bowel sounds Musculoskeletal:no cyanosis of digits and no clubbing  NEURO: alert & oriented x 3 with fluent speech, no focal motor/sensory deficits BREAST: S/p Right lumpectomy: Surgical incision healed well. (+) Right breast tissue firm. Left Breast exam benign.   LABORATORY DATA:  I have reviewed the data as listed CBC Latest Ref Rng & Units 02/04/2020 08/05/2019 02/08/2019  WBC 4.0 - 10.5 K/uL 6.0 5.0 6.8  Hemoglobin 12.0 - 15.0 g/dL 12.0 12.3 12.6  Hematocrit 36 - 46 % 39.0 39.2 40.4  Platelets 150 - 400 K/uL 175 171 191     CMP Latest Ref Rng & Units 02/04/2020 08/05/2019 07/29/2019  Glucose 70 - 99 mg/dL 128(H) 312(H) 353(H)  BUN 8 - 23 mg/dL _0 Creatinine 0.44 - 1.00 mg/dL 1.38(H) 1.32(H) 1.13  Sodium 135 - 145 mmol/L 141 136 138  Potassium 3.5 - 5.1 mmol/L 5.1 4.6 5.0  Chloride 98 - 111 mmol/L 110 102 102  CO2 22 - 32 mmol/L _1 Calcium 8.9 - 10.3 mg/dL 9.5 9.3 9.7  Total Protein 6.5 - 8.1 g/dL 7.0 6.9 -  Total Bilirubin 0.3 - 1.2 mg/dL 0.3 0.5 -  Alkaline Phos 38 - 126 U/L 51 77 -  AST 15 - 41 U/L 14(L) 13(L) -  ALT 0 - 44 U/L 11 14 -      RADIOGRAPHIC STUDIES: I have personally reviewed the radiological images as listed and agreed with the findings in the report. No results found.   ASSESSMENT & PLAN:  Jasmine Carlson is a 79 y.o. female with    1. 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 Treated withrightlumpectomy, chemo and radiation. -She started antiestrogen  therapy with anastrozole in 08/2015. Due to osteoporosis I started switched her to Tamoxifen for an additional 2 years in 07/2019. She is tolerating well. Plan to complete in 2024.  -She is clinically doing well. Lab reviewed, her CBC and CMP are within normal limits. Her physical exam and her 9/20921 mammogram were unremarkable. There is no clinical concern for recurrence. -Continue surveillance. Next screening mammogram in 12/2020.  -Continue Tamoxifen  -F/u in one year    2.Osteoporosis  -Her DEXA from 05/12/19 shows osteoporosis  with lowest T-score at -2.7 at right hip.Scan before was 4-5 years prior in [place]. She was not told it was abnormal then. -I switched her to Tamoxifen in 07/2019.  -Iencouraged her tocontinue her calcium withVitamin D. -Due to her CKD, will hold on biphosphonate for now. May consider Prolia injection in the future  3. HTN,DM, mild neuropathy, CKD -f/u with PCPand continue medications  -She has mild neuropathy which is managed with Gabapentin.   Plan -She is clinically doing well  -Continue Tamoxifen  -Screening Mammogram in 12/2020  -Lab and F/u in one year    No problem-specific Assessment & Plan notes found for this encounter.   No orders of the defined types were placed in this encounter.  All questions were answered. The patient knows to call the clinic with any problems, questions or concerns. No barriers to learning was detected. The total time spent in the appointment was 25 minutes.     Truitt Merle, MD 02/04/2020   I, Joslyn Devon, am acting as scribe for Truitt Merle, MD.   I have reviewed the above documentation for accuracy and completeness, and I agree with the above.

## 2020-02-04 ENCOUNTER — Inpatient Hospital Stay: Payer: Medicare Other

## 2020-02-04 ENCOUNTER — Inpatient Hospital Stay: Payer: Medicare Other | Attending: Hematology | Admitting: Hematology

## 2020-02-04 ENCOUNTER — Encounter: Payer: Self-pay | Admitting: Hematology

## 2020-02-04 ENCOUNTER — Other Ambulatory Visit: Payer: Self-pay

## 2020-02-04 VITALS — BP 184/83 | HR 75 | Temp 95.0°F | Resp 18 | Ht 63.0 in | Wt 161.9 lb

## 2020-02-04 DIAGNOSIS — C50211 Malignant neoplasm of upper-inner quadrant of right female breast: Secondary | ICD-10-CM | POA: Diagnosis not present

## 2020-02-04 DIAGNOSIS — R6889 Other general symptoms and signs: Secondary | ICD-10-CM | POA: Diagnosis not present

## 2020-02-04 DIAGNOSIS — Z923 Personal history of irradiation: Secondary | ICD-10-CM | POA: Insufficient documentation

## 2020-02-04 DIAGNOSIS — Z7984 Long term (current) use of oral hypoglycemic drugs: Secondary | ICD-10-CM | POA: Insufficient documentation

## 2020-02-04 DIAGNOSIS — M1990 Unspecified osteoarthritis, unspecified site: Secondary | ICD-10-CM | POA: Insufficient documentation

## 2020-02-04 DIAGNOSIS — Z8673 Personal history of transient ischemic attack (TIA), and cerebral infarction without residual deficits: Secondary | ICD-10-CM | POA: Diagnosis not present

## 2020-02-04 DIAGNOSIS — Z9221 Personal history of antineoplastic chemotherapy: Secondary | ICD-10-CM | POA: Insufficient documentation

## 2020-02-04 DIAGNOSIS — E785 Hyperlipidemia, unspecified: Secondary | ICD-10-CM | POA: Diagnosis not present

## 2020-02-04 DIAGNOSIS — M810 Age-related osteoporosis without current pathological fracture: Secondary | ICD-10-CM | POA: Insufficient documentation

## 2020-02-04 DIAGNOSIS — E1122 Type 2 diabetes mellitus with diabetic chronic kidney disease: Secondary | ICD-10-CM | POA: Diagnosis not present

## 2020-02-04 DIAGNOSIS — Z1231 Encounter for screening mammogram for malignant neoplasm of breast: Secondary | ICD-10-CM

## 2020-02-04 DIAGNOSIS — I10 Essential (primary) hypertension: Secondary | ICD-10-CM | POA: Insufficient documentation

## 2020-02-04 DIAGNOSIS — C50911 Malignant neoplasm of unspecified site of right female breast: Secondary | ICD-10-CM

## 2020-02-04 DIAGNOSIS — Z79810 Long term (current) use of selective estrogen receptor modulators (SERMs): Secondary | ICD-10-CM | POA: Insufficient documentation

## 2020-02-04 DIAGNOSIS — Z79899 Other long term (current) drug therapy: Secondary | ICD-10-CM | POA: Insufficient documentation

## 2020-02-04 DIAGNOSIS — Z170 Estrogen receptor positive status [ER+]: Secondary | ICD-10-CM

## 2020-02-04 LAB — COMPREHENSIVE METABOLIC PANEL
ALT: 11 U/L (ref 0–44)
AST: 14 U/L — ABNORMAL LOW (ref 15–41)
Albumin: 3.5 g/dL (ref 3.5–5.0)
Alkaline Phosphatase: 51 U/L (ref 38–126)
Anion gap: 3 — ABNORMAL LOW (ref 5–15)
BUN: 18 mg/dL (ref 8–23)
CO2: 28 mmol/L (ref 22–32)
Calcium: 9.5 mg/dL (ref 8.9–10.3)
Chloride: 110 mmol/L (ref 98–111)
Creatinine, Ser: 1.38 mg/dL — ABNORMAL HIGH (ref 0.44–1.00)
GFR calc non Af Amer: 36 mL/min — ABNORMAL LOW (ref >60)
Glucose, Bld: 128 mg/dL — ABNORMAL HIGH (ref 70–99)
Potassium: 5.1 mmol/L (ref 3.5–5.1)
Sodium: 141 mmol/L (ref 135–145)
Total Bilirubin: 0.3 mg/dL (ref 0.3–1.2)
Total Protein: 7 g/dL (ref 6.5–8.1)

## 2020-02-04 LAB — CBC WITH DIFFERENTIAL/PLATELET
Abs Immature Granulocytes: 0.02 10*3/uL (ref 0.00–0.07)
Basophils Absolute: 0 10*3/uL (ref 0.0–0.1)
Basophils Relative: 0 %
Eosinophils Absolute: 0.1 10*3/uL (ref 0.0–0.5)
Eosinophils Relative: 2 %
HCT: 39 % (ref 36.0–46.0)
Hemoglobin: 12 g/dL (ref 12.0–15.0)
Immature Granulocytes: 0 %
Lymphocytes Relative: 23 %
Lymphs Abs: 1.4 10*3/uL (ref 0.7–4.0)
MCH: 27.7 pg (ref 26.0–34.0)
MCHC: 30.8 g/dL (ref 30.0–36.0)
MCV: 90.1 fL (ref 80.0–100.0)
Monocytes Absolute: 0.7 10*3/uL (ref 0.1–1.0)
Monocytes Relative: 11 %
Neutro Abs: 3.8 10*3/uL (ref 1.7–7.7)
Neutrophils Relative %: 64 %
Platelets: 175 10*3/uL (ref 150–400)
RBC: 4.33 MIL/uL (ref 3.87–5.11)
RDW: 13.9 % (ref 11.5–15.5)
WBC: 6 10*3/uL (ref 4.0–10.5)
nRBC: 0 % (ref 0.0–0.2)

## 2020-02-07 ENCOUNTER — Telehealth: Payer: Self-pay | Admitting: Hematology

## 2020-02-07 NOTE — Telephone Encounter (Signed)
Scheduled per 10/8 los. Mailing pt appt calendar.

## 2020-02-16 ENCOUNTER — Telehealth: Payer: Self-pay | Admitting: Family Medicine

## 2020-02-16 NOTE — Telephone Encounter (Signed)
Pt call and stated dr. Volanda Napoleon increase her metformin to 2 tablet a day she only have a 90 day supply and would refill for the other 90 sent to   [place], [place], [address] Phone:  [phone]  Fax:  [phone]

## 2020-02-17 ENCOUNTER — Other Ambulatory Visit: Payer: Self-pay

## 2020-02-17 DIAGNOSIS — E1140 Type 2 diabetes mellitus with diabetic neuropathy, unspecified: Secondary | ICD-10-CM

## 2020-02-17 DIAGNOSIS — R6889 Other general symptoms and signs: Secondary | ICD-10-CM | POA: Diagnosis not present

## 2020-02-17 MED ORDER — METFORMIN HCL 500 MG PO TABS: ORAL_TABLET | ORAL | 1 refills | Status: DC

## 2020-02-17 NOTE — Telephone Encounter (Signed)
Pt Rx sent to her pharmacy as requested

## 2020-03-10 ENCOUNTER — Ambulatory Visit: Payer: Medicare Other | Admitting: Family Medicine

## 2020-03-10 ENCOUNTER — Telehealth: Payer: Self-pay | Admitting: *Deleted

## 2020-03-10 NOTE — Telephone Encounter (Signed)
Patient's son dropped off a form to be completed an [employer] form. Form is to be faxed to [phone].   Form placed in Dr Volanda Napoleon folder at front desk.

## 2020-03-10 NOTE — Telephone Encounter (Signed)
Pt form has been received and placed on Dr Volanda Napoleon for completing. Pt will be notified when form is ready for pick up

## 2020-03-13 NOTE — Telephone Encounter (Signed)
Pt is calling to check the status of the form and was made aware that we ask for 3-5 business days for it to be filled out and she verbalized understanding.  Pt stated that she does not need a copy for her records she just would like for it to be faxed to the company.

## 2020-03-15 NOTE — Telephone Encounter (Signed)
FYI

## 2020-03-27 NOTE — Telephone Encounter (Signed)
I believe form was returned to [employer] as pt had not completed her part.

## 2020-03-28 ENCOUNTER — Other Ambulatory Visit: Payer: Self-pay | Admitting: Nurse Practitioner

## 2020-03-28 DIAGNOSIS — C50211 Malignant neoplasm of upper-inner quadrant of right female breast: Secondary | ICD-10-CM

## 2020-03-28 DIAGNOSIS — Z170 Estrogen receptor positive status [ER+]: Secondary | ICD-10-CM

## 2020-03-28 NOTE — Telephone Encounter (Signed)
Spoke with pt advised that she needs to come by the office and complete her part. Pt sate that she will come by the office tomorrow.

## 2020-04-04 NOTE — Telephone Encounter (Signed)
Pt came by the office and completed her part, Form placed back on Dr Volanda Napoleon for completing

## 2020-04-26 NOTE — Telephone Encounter (Signed)
Pt [employer] forms have been completed and faxed to [employer], copy sent to scanning

## 2020-05-30 ENCOUNTER — Other Ambulatory Visit: Payer: Self-pay | Admitting: Family Medicine

## 2020-05-30 DIAGNOSIS — I10 Essential (primary) hypertension: Secondary | ICD-10-CM

## 2020-05-30 DIAGNOSIS — Z8673 Personal history of transient ischemic attack (TIA), and cerebral infarction without residual deficits: Secondary | ICD-10-CM

## 2020-06-20 ENCOUNTER — Telehealth: Payer: Self-pay | Admitting: Nurse Practitioner

## 2020-06-20 NOTE — Telephone Encounter (Signed)
Contacted patient about moved appointment. Due to providers template. Patient is aware of changes.

## 2020-06-29 ENCOUNTER — Other Ambulatory Visit: Payer: Self-pay

## 2020-06-29 DIAGNOSIS — C50211 Malignant neoplasm of upper-inner quadrant of right female breast: Secondary | ICD-10-CM

## 2020-06-29 DIAGNOSIS — Z170 Estrogen receptor positive status [ER+]: Secondary | ICD-10-CM

## 2020-06-29 MED ORDER — TAMOXIFEN CITRATE 20 MG PO TABS: 20.0000 mg | ORAL_TABLET | Freq: Every day | ORAL | 3 refills | Status: DC

## 2020-07-10 ENCOUNTER — Other Ambulatory Visit: Payer: Self-pay

## 2020-07-10 DIAGNOSIS — Z8673 Personal history of transient ischemic attack (TIA), and cerebral infarction without residual deficits: Secondary | ICD-10-CM

## 2020-07-10 DIAGNOSIS — E1140 Type 2 diabetes mellitus with diabetic neuropathy, unspecified: Secondary | ICD-10-CM

## 2020-07-10 DIAGNOSIS — I10 Essential (primary) hypertension: Secondary | ICD-10-CM

## 2020-07-10 MED ORDER — CARVEDILOL 6.25 MG PO TABS: 6.2500 mg | ORAL_TABLET | Freq: Two times a day (BID) | ORAL | 3 refills | Status: DC

## 2020-07-10 MED ORDER — CLOPIDOGREL BISULFATE 75 MG PO TABS: 75.0000 mg | ORAL_TABLET | Freq: Every day | ORAL | 3 refills | Status: DC

## 2020-07-10 MED ORDER — LOSARTAN POTASSIUM 100 MG PO TABS: 100.0000 mg | ORAL_TABLET | Freq: Every day | ORAL | 3 refills | Status: DC

## 2020-07-10 MED ORDER — SIMVASTATIN 40 MG PO TABS: 40.0000 mg | ORAL_TABLET | Freq: Every evening | ORAL | 3 refills | Status: DC

## 2020-07-10 MED ORDER — METFORMIN HCL 500 MG PO TABS: ORAL_TABLET | ORAL | 1 refills | Status: DC

## 2020-07-14 ENCOUNTER — Other Ambulatory Visit: Payer: Self-pay

## 2020-07-20 ENCOUNTER — Other Ambulatory Visit: Payer: Self-pay | Admitting: *Deleted

## 2020-07-20 DIAGNOSIS — M810 Age-related osteoporosis without current pathological fracture: Secondary | ICD-10-CM

## 2020-07-20 MED ORDER — GABAPENTIN 300 MG PO CAPS: 300.0000 mg | ORAL_CAPSULE | Freq: Every day | ORAL | 1 refills | Status: DC

## 2020-07-20 NOTE — Telephone Encounter (Signed)
[facility]  Refill request for Gabapentin 300  Last filled by Royston Bake, RN  Okay to fill?

## 2020-07-20 NOTE — Addendum Note (Signed)
Addended by: Westley Hummer B on: 07/20/2020 04:30 PM   Modules accepted: Orders

## 2020-07-20 NOTE — Telephone Encounter (Signed)
Oscal + D last filled by Truitt Merle, MD  Okay to fill?

## 2020-07-21 MED ORDER — CALCIUM CARBONATE-VITAMIN D 500-200 MG-UNIT PO TABS: 1.0000 | ORAL_TABLET | Freq: Every day | ORAL | 3 refills | Status: AC

## 2020-07-25 ENCOUNTER — Telehealth: Payer: Self-pay | Admitting: Family Medicine

## 2020-07-25 NOTE — Telephone Encounter (Signed)
Not on current med list, Okay to fill? Directions please

## 2020-07-25 NOTE — Telephone Encounter (Signed)
Pt call and stated she want a RX for Omega XL sent to  [facility] mail order and would like a call back.

## 2020-07-31 NOTE — Telephone Encounter (Signed)
Okay to order for patient.

## 2020-07-31 NOTE — Telephone Encounter (Signed)
Patient is needing to know if Dr. Volanda Napoleon can order Omega XL through [facility]. She is needing to know before Saturday before she goes out of town in case she has to go some where else to get this Omega XL.

## 2020-08-03 NOTE — Telephone Encounter (Signed)
Not sure what this is.

## 2020-08-04 NOTE — Telephone Encounter (Signed)
Called patient to advise Omega XL is OTC.

## 2020-09-12 ENCOUNTER — Other Ambulatory Visit: Payer: Self-pay

## 2020-09-13 ENCOUNTER — Encounter: Payer: Self-pay | Admitting: Family Medicine

## 2020-09-13 ENCOUNTER — Ambulatory Visit (INDEPENDENT_AMBULATORY_CARE_PROVIDER_SITE_OTHER): Payer: Medicare HMO | Admitting: Family Medicine

## 2020-09-13 VITALS — BP 148/74 | HR 76 | Temp 98.7°F | Wt 162.6 lb

## 2020-09-13 DIAGNOSIS — I10 Essential (primary) hypertension: Secondary | ICD-10-CM

## 2020-09-13 DIAGNOSIS — C50211 Malignant neoplasm of upper-inner quadrant of right female breast: Secondary | ICD-10-CM

## 2020-09-13 DIAGNOSIS — M818 Other osteoporosis without current pathological fracture: Secondary | ICD-10-CM

## 2020-09-13 DIAGNOSIS — K649 Unspecified hemorrhoids: Secondary | ICD-10-CM | POA: Diagnosis not present

## 2020-09-13 DIAGNOSIS — Z8673 Personal history of transient ischemic attack (TIA), and cerebral infarction without residual deficits: Secondary | ICD-10-CM

## 2020-09-13 DIAGNOSIS — E1140 Type 2 diabetes mellitus with diabetic neuropathy, unspecified: Secondary | ICD-10-CM

## 2020-09-13 DIAGNOSIS — N1832 Chronic kidney disease, stage 3b: Secondary | ICD-10-CM | POA: Diagnosis not present

## 2020-09-13 DIAGNOSIS — Z170 Estrogen receptor positive status [ER+]: Secondary | ICD-10-CM

## 2020-09-13 LAB — POCT GLYCOSYLATED HEMOGLOBIN (HGB A1C): Hemoglobin A1C: 5.9 % — AB (ref 4.0–5.6)

## 2020-09-13 NOTE — Patient Instructions (Addendum)
Your glucometer needs a new battery.  The battery number for it is [id number].  You can find this battery at your local drugstore or online.  You can also try Tucks pads or wipes, or Preparation H cream for your hemorrhoids.   Hemorrhoids Hemorrhoids are swollen veins that may develop:  In the butt (rectum). These are called internal hemorrhoids.  Around the opening of the butt (anus). These are called external hemorrhoids. Hemorrhoids can cause pain, itching, or bleeding. Most of the time, they do not cause serious problems. They usually get better with diet changes, lifestyle changes, and other home treatments. What are the causes? This condition may be caused by:  Having trouble pooping (constipation).  Pushing hard (straining) to poop.  Watery poop (diarrhea).  Pregnancy.  Being very overweight (obese).  Sitting for long periods of time.  Heavy lifting or other activity that causes you to strain.  Anal sex.  Riding a bike for a long period of time. What are the signs or symptoms? Symptoms of this condition include:  Pain.  Itching or soreness in the butt.  Bleeding from the butt.  Leaking poop.  Swelling in the area.  One or more lumps around the opening of your butt. How is this diagnosed? A doctor can often diagnose this condition by looking at the affected area. The doctor may also:  Do an exam that involves feeling the area with a gloved hand (digital rectal exam).  Examine the area inside your butt using a small tube (anoscope).  Order blood tests. This may be done if you have lost a lot of blood.  Have you get a test that involves looking inside the colon using a flexible tube with a camera on the end (sigmoidoscopy or colonoscopy). How is this treated? This condition can usually be treated at home. Your doctor may tell you to change what you eat, make lifestyle changes, or try home treatments. If these do not help, procedures can be done to remove the  hemorrhoids or make them smaller. These may involve:  Placing rubber bands at the base of the hemorrhoids to cut off their blood supply.  Injecting medicine into the hemorrhoids to shrink them.  Shining a type of light energy onto the hemorrhoids to cause them to fall off.  Doing surgery to remove the hemorrhoids or cut off their blood supply. Follow these instructions at home: Eating and drinking  Eat foods that have a lot of fiber in them. These include whole grains, beans, nuts, fruits, and vegetables.  Ask your doctor about taking products that have added fiber (fibersupplements).  Reduce the amount of fat in your diet. You can do this by: ? Eating low-fat dairy products. ? Eating less red meat. ? Avoiding processed foods.  Drink enough fluid to keep your pee (urine) pale yellow.   Managing pain and swelling  Take a warm-water bath (sitz bath) for 20 minutes to ease pain. Do this 3-4 times a day. You may do this in a bathtub or using a portable sitz bath that fits over the toilet.  If told, put ice on the painful area. It may be helpful to use ice between your warm baths. ? Put ice in a plastic bag. ? Place a towel between your skin and the bag. ? Leave the ice on for 20 minutes, 2-3 times a day.   General instructions  Take over-the-counter and prescription medicines only as told by your doctor. ? Medicated creams and medicines may be  used as told.  Exercise often. Ask your doctor how much and what kind of exercise is best for you.  Go to the bathroom when you have the urge to poop. Do not wait.  Avoid pushing too hard when you poop.  Keep your butt dry and clean. Use wet toilet paper or moist towelettes after pooping.  Do not sit on the toilet for a long time.  Keep all follow-up visits as told by your doctor. This is important. Contact a doctor if you:  Have pain and swelling that do not get better with treatment or medicine.  Have trouble pooping.  Cannot  poop.  Have pain or swelling outside the area of the hemorrhoids. Get help right away if you have:  Bleeding that will not stop. Summary  Hemorrhoids are swollen veins in the butt or around the opening of the butt.  They can cause pain, itching, or bleeding.  Eat foods that have a lot of fiber in them. These include whole grains, beans, nuts, fruits, and vegetables.  Take a warm-water bath (sitz bath) for 20 minutes to ease pain. Do this 3-4 times a day. This information is not intended to replace advice given to you by your health care provider. Make sure you discuss any questions you have with your health care provider. Document Revised: 04/23/2018 Document Reviewed: 09/04/2017 Elsevier Patient Education  2021 [place].  Hypertension, Adult Hypertension is another name for high blood pressure. High blood pressure forces your heart to work harder to pump blood. This can cause problems over time. There are two numbers in a blood pressure reading. There is a top number (systolic) over a bottom number (diastolic). It is best to have a blood pressure that is below 120/80. Healthy choices can help lower your blood pressure, or you may need medicine to help lower it. What are the causes? The cause of this condition is not known. Some conditions may be related to high blood pressure. What increases the risk?  Smoking.  Having type 2 diabetes mellitus, high cholesterol, or both.  Not getting enough exercise or physical activity.  Being overweight.  Having too much fat, sugar, calories, or salt (sodium) in your diet.  Drinking too much alcohol.  Having long-term (chronic) kidney disease.  Having a family history of high blood pressure.  Age. Risk increases with age.  Race. You may be at higher risk if you are African American.  Gender. Men are at higher risk than women before age 29. After age 4, women are at higher risk than men.  Having obstructive sleep  apnea.  Stress. What are the signs or symptoms?  High blood pressure may not cause symptoms. Very high blood pressure (hypertensive crisis) may cause: ? Headache. ? Feelings of worry or nervousness (anxiety). ? Shortness of breath. ? Nosebleed. ? A feeling of being sick to your stomach (nausea). ? Throwing up (vomiting). ? Changes in how you see. ? Very bad chest pain. ? Seizures. How is this treated?  This condition is treated by making healthy lifestyle changes, such as: ? Eating healthy foods. ? Exercising more. ? Drinking less alcohol.  Your health care provider may prescribe medicine if lifestyle changes are not enough to get your blood pressure under control, and if: ? Your top number is above 130. ? Your bottom number is above 80.  Your personal target blood pressure may vary. Follow these instructions at home: Eating and drinking  If told, follow the DASH eating plan. To  follow this plan: ? Fill one half of your plate at each meal with fruits and vegetables. ? Fill one fourth of your plate at each meal with whole grains. Whole grains include whole-wheat pasta, brown rice, and whole-grain bread. ? Eat or drink low-fat dairy products, such as skim milk or low-fat yogurt. ? Fill one fourth of your plate at each meal with low-fat (lean) proteins. Low-fat proteins include fish, chicken without skin, eggs, beans, and tofu. ? Avoid fatty meat, cured and processed meat, or chicken with skin. ? Avoid pre-made or processed food.  Eat less than 1,500 mg of salt each day.  Do not drink alcohol if: ? Your doctor tells you not to drink. ? You are pregnant, may be pregnant, or are planning to become pregnant.  If you drink alcohol: ? Limit how much you use to:  0-1 drink a day for women.  0-2 drinks a day for men. ? Be aware of how much alcohol is in your drink. In the [place] one drink equals one 12 oz bottle of beer (355 mL), one 5 oz glass of wine (148 mL), or one 1 oz  glass of hard liquor (44 mL).   Lifestyle  Work with your doctor to stay at a healthy weight or to lose weight. Ask your doctor what the best weight is for you.  Get at least 30 minutes of exercise most days of the week. This may include walking, swimming, or biking.  Get at least 30 minutes of exercise that strengthens your muscles (resistance exercise) at least 3 days a week. This may include lifting weights or doing Pilates.  Do not use any products that contain nicotine or tobacco, such as cigarettes, e-cigarettes, and chewing tobacco. If you need help quitting, ask your doctor.  Check your blood pressure at home as told by your doctor.  Keep all follow-up visits as told by your doctor. This is important.   Medicines  Take over-the-counter and prescription medicines only as told by your doctor. Follow directions carefully.  Do not skip doses of blood pressure medicine. The medicine does not work as well if you skip doses. Skipping doses also puts you at risk for problems.  Ask your doctor about side effects or reactions to medicines that you should watch for. Contact a doctor if you:  Think you are having a reaction to the medicine you are taking.  Have headaches that keep coming back (recurring).  Feel dizzy.  Have swelling in your ankles.  Have trouble with your vision. Get help right away if you:  Get a very bad headache.  Start to feel mixed up (confused).  Feel weak or numb.  Feel faint.  Have very bad pain in your: ? Chest. ? Belly (abdomen).  Throw up more than once.  Have trouble breathing. Summary  Hypertension is another name for high blood pressure.  High blood pressure forces your heart to work harder to pump blood.  For most people, a normal blood pressure is less than 120/80.  Making healthy choices can help lower blood pressure. If your blood pressure does not get lower with healthy choices, you may need to take medicine. This information  is not intended to replace advice given to you by your health care provider. Make sure you discuss any questions you have with your health care provider. Document Revised: 12/24/2017 Document Reviewed: 12/24/2017 Elsevier Patient Education  2021 [employer].

## 2020-09-13 NOTE — Progress Notes (Signed)
Subjective:    Patient ID: Jasmine Carlson, female    DOB: 1941/01/18, 80 y.o.   MRN: [id number]  Chief Complaint  Patient presents with  . Hemorrhoids    Stated it feels like something is there, after she uses the bathroom and when she is in the shower. States it feels like a bubble when she breaks wind, it is not like a 'poot' but a bubble Has not taken or tried anything, they are not painful.    HPI Patient is a 80 yo female with pmh sig for HTN, h/o TIA, osteoporosis, h/o GIB, DM II, h/o R breast cancer, arthritis seen for ongoing concern.  Patient endorses feeling a "bubble" of tissue protruding from rectum at times.  Patient states the area is not painful, does not itch, and does not bleed.  Patient notes daily BMs with occasional straining.  At times feels like she does not completely empty her bowels 2/2 the sensation.  Purchased OTC suppositories for hemorrhoids but was afraid to take them.  Pt has been unable to check blood sugar recently as her meter would not turn on.  Unsure if she needs a new meter.  Has meter with her.  Pt recently went to the [facility] area.  Past Medical History:  Diagnosis Date  . Arthritis   . Breast cancer ([place])   . Colon polyps   . Diabetes mellitus without complication ([place])   . GI bleed   . Hyperlipidemia   . Hypertension   . Personal history of chemotherapy   . Personal history of radiation therapy   . Stroke ([place])     No Known Allergies  ROS General: Denies fever, chills, night sweats, changes in weight, changes in appetite HEENT: Denies headaches, ear pain, changes in vision, rhinorrhea, sore throat CV: Denies CP, palpitations, SOB, orthopnea Pulm: Denies SOB, cough, wheezing GI: Denies abdominal pain, nausea, vomiting, diarrhea, constipation  + hemorrhoids GU: Denies dysuria, hematuria, frequency, vaginal discharge Msk: Denies muscle cramps, joint pains Neuro: Denies weakness, numbness, tingling Skin: Denies rashes, bruising Psych: Denies  depression, anxiety, hallucinations     Objective:    Blood pressure (!) 148/74, pulse 76, temperature 98.7 F (37.1 C), temperature source Oral, weight 162 lb 9.6 oz (73.8 kg), SpO2 97 %.  Gen. Pleasant, well-nourished, in no distress, normal affect   HEENT: [place]/AT, face symmetric, conjunctiva clear, no scleral icterus, PERRLA, EOMI, nares patent without drainage, pharynx without erythema or exudate. Neck: No JVD, no thyromegaly, no carotid bruits Lungs: no accessory muscle use, CTAB, no wheezes or rales Cardiovascular: RRR, no m/r/g, no peripheral edema Abdomen: BS present, soft, NT/ND, no hepatosplenomegaly. Musculoskeletal: No deformities, no cyanosis or clubbing, normal tone Neuro:  A&Ox3, CN II-XII intact Skin:  Warm, no lesions/ rash  Diabetic Foot Exam - Simple   Simple Foot Form Diabetic Foot exam was performed with the following findings: Yes 09/13/2020  2:00 PM  Visual Inspection No deformities, no ulcerations, no other skin breakdown bilaterally: Yes Sensation Testing Intact to touch and monofilament testing bilaterally: Yes Pulse Check Posterior Tibialis and Dorsalis pulse intact bilaterally: Yes Comments Decreased vibratory sense L>R foot.  Monofilament mildly diminished in L foot.     Wt Readings from Last 3 Encounters:  09/13/20 162 lb 9.6 oz (73.8 kg)  02/04/20 161 lb 14.4 oz (73.4 kg)  08/05/19 163 lb 4.8 oz (74.1 kg)    Lab Results  Component Value Date   WBC 6.0 02/04/2020   HGB 12.0 02/04/2020  HCT 39.0 02/04/2020   PLT 175 02/04/2020   GLUCOSE 128 (H) 02/04/2020   CHOL 171 07/29/2019   TRIG 98.0 07/29/2019   HDL 40.90 07/29/2019   LDLCALC 111 (H) 07/29/2019   ALT 11 02/04/2020   AST 14 (L) 02/04/2020   NA 141 02/04/2020   K 5.1 02/04/2020   CL 110 02/04/2020   CREATININE 1.38 (H) 02/04/2020   BUN 18 02/04/2020   CO2 28 02/04/2020   INR 1.15 11/04/2017   HGBA1C 12.9 (H) 07/29/2019   MICROALBUR 2.6 (H) 07/29/2019     Assessment/Plan:  Hemorrhoids, unspecified hemorrhoid type -Discussed prevention including increasing p.o. intake of water and fiber. -Consider daily bowel regimen such as Colace and senna or MiraLAX -Okay to use OTC hemorrhoid products -For increased or worsening symptoms patient to notify clinic -Given strict precautions 2/2 history of GIB -If needed follow-up with GI  Type 2 diabetes mellitus with diabetic neuropathy, without long-term current use of insulin ([place])  -Appears meter likely needs new battery for One Touch glucometer.  Patient given battery number [id number]. -Foot exam done this visit -Lifestyle modifications -Continue current medications including metformin 500 mg twice daily and gabapentin 300 mg nightly for neuropathy -Continue ARB and statin - Plan: POC HgB A1c  Essential hypertension -Elevated -Recheck -Lifestyle modifications -Continue current medications including Coreg 6.25 mg twice daily, losartan 100 mg  Malignant neoplasm of upper-inner quadrant of right breast in female, estrogen receptor positive (HCC) -Stable -s/p R lumpectomy, chemo, xrt, anastrozole.   -anastrozole 1 mg daily d/c'd 2/2 osteoporosis in 07/2019. -Continue tamoxifen 20 mg daily.  To complete in 2024 -Continue regular mammograms -Continue follow-up with oncology, Dr. Burr Medico  History of TIA -Lifestyle modifications  -BP control -Continue Plavix 75 mg daily, Zocor 40 mg daily, Coreg 6.25 mg twice daily, losartan 100 mg daily  Other osteoporosis without current pathological fracture -Bone density scan 05/12/2019 with T score -2.7 and right hip -Anastrozole d/c 07/2019 by Oncology -Continue Os-Cal D -Consider Prolia for worsening T score  Chronic kidney disease stage IIIb -Stable -Continue lifestyle modifications and BP control -GFR 44 on 08/05/2019 and 36 on 02/04/2020 -Avoid nephrotoxic meds and renally dose medications -Follow-up with nephrology  F/u 2-3 months  Grier Mitts, MD

## 2020-09-26 DIAGNOSIS — N1832 Chronic kidney disease, stage 3b: Secondary | ICD-10-CM | POA: Insufficient documentation

## 2020-09-26 DIAGNOSIS — K649 Unspecified hemorrhoids: Secondary | ICD-10-CM | POA: Insufficient documentation

## 2020-10-09 ENCOUNTER — Telehealth: Payer: Self-pay | Admitting: Family Medicine

## 2020-10-09 DIAGNOSIS — E1140 Type 2 diabetes mellitus with diabetic neuropathy, unspecified: Secondary | ICD-10-CM

## 2020-10-09 MED ORDER — BLOOD GLUCOSE MONITOR KIT: PACK | 0 refills | Status: DC

## 2020-10-09 NOTE — Telephone Encounter (Signed)
Spoke with patient, informed her a new meter was sent to pharmacy.

## 2020-10-09 NOTE — Addendum Note (Signed)
Addended by: Anderson Malta on: 10/09/2020 03:18 PM   Modules accepted: Orders

## 2020-10-09 NOTE — Telephone Encounter (Signed)
Patient seen Dr. Volanda Napoleon on 09/13/2020 and Dr. Volanda Napoleon was going to give her a new glucose meter and the patient told her that she had one at home and she changed the batteries out and the meter still doesn't work and she was wondering if Dr. Volanda Napoleon can send her in a new meter and also let her know when it is sent so she can get it as soon as possible so she can check her blood sugar.

## 2020-10-10 ENCOUNTER — Ambulatory Visit (INDEPENDENT_AMBULATORY_CARE_PROVIDER_SITE_OTHER): Payer: Medicare HMO

## 2020-10-10 DIAGNOSIS — Z0000 Encounter for general adult medical examination without abnormal findings: Secondary | ICD-10-CM | POA: Diagnosis not present

## 2020-10-10 NOTE — Progress Notes (Signed)
Subjective:   Jasmine Carlson is a 80 y.o. female who presents for an Initial Medicare Annual Wellness Visit. I connected with Jasmine Carlson today by telephone and verified that I am speaking with the correct person using two identifiers. Location patient: home Location provider: work Persons participating in the virtual visit: patient, provider.   I discussed the limitations, risks, security and privacy concerns of performing an evaluation and management service by telephone and the availability of in person appointments. I also discussed with the patient that there may be a patient responsible charge related to this service. The patient expressed understanding and verbally consented to this telephonic visit.    Interactive audio and video telecommunications were attempted between this provider and patient, however failed, due to patient having technical difficulties OR patient did not have access to video capability.  We continued and completed visit with audio only.     Review of Systems    N/a       Objective:    There were no vitals filed for this visit. There is no height or weight on file to calculate BMI.  Advanced Directives 02/04/2020 11/04/2017 06/25/2017 05/06/2017 12/06/2016  Does Patient Have a Medical Advance Directive? _0   Would patient like information on creating a medical advance directive? - Yes (MAU/Ambulatory/Procedural Areas - Information given) - - -    Current Medications (verified) Outpatient Encounter Medications as of 10/10/2020  Medication Sig   anastrozole (ARIMIDEX) 1 MG tablet Take 1 tablet (1 mg total) by mouth daily.   blood glucose meter kit and supplies KIT Dispense based on patient and insurance preference. Use up to four times daily as directed. (FOR ICD-9 250.00, 250.01).   calcium-vitamin D (OSCAL WITH D) 500-200 MG-UNIT tablet Take 1 tablet by mouth daily with breakfast.   carvedilol (COREG) 6.25 MG tablet Take 1 tablet (6.25 mg total) by  mouth 2 (two) times daily with a meal.   clopidogrel (PLAVIX) 75 MG tablet Take 1 tablet (75 mg total) by mouth daily.   gabapentin (NEURONTIN) 300 MG capsule Take 1 capsule (300 mg total) by mouth at bedtime.   losartan (COZAAR) 100 MG tablet Take 1 tablet (100 mg total) by mouth daily.   metFORMIN (GLUCOPHAGE) 500 MG tablet TAKE 1 TABLET (500 MG) BY MOUTH TWICE A DAY WITH BREAKFAST AND DINNER.   nystatin (MYCOSTATIN) 100000 UNIT/ML suspension Take 5 mLs (500,000 Units total) by mouth 4 (four) times daily.   Nystatin POWD 1 application by Does not apply route 2 (two) times daily.   OneTouch Delica Lancets 42P MISC TEST UP TO FOUR TIMES DAILY AS DIRECTED   ONETOUCH VERIO test strip TEST UP TO FOUR TIMES DAILY AS DIRECTED   simvastatin (ZOCOR) 40 MG tablet Take 1 tablet (40 mg total) by mouth every evening.   tamoxifen (NOLVADEX) 20 MG tablet Take 1 tablet (20 mg total) by mouth daily.   Facility-Administered Encounter Medications as of 10/10/2020  Medication   0.9 %  sodium chloride infusion    Allergies (verified) Patient has no known allergies.   History: Past Medical History:  Diagnosis Date   Arthritis    Breast cancer ([place])    Colon polyps    Diabetes mellitus without complication ([place])    GI bleed    Hyperlipidemia    Hypertension    Personal history of chemotherapy    Personal history of radiation therapy    Stroke [facility])    Past Surgical History:  Procedure Laterality Date   ABDOMINAL HYSTERECTOMY     30 years ago   BREAST BIOPSY     BREAST LUMPECTOMY Right 08/02/2014   IR REMOVAL TUN ACCESS W/ PORT W/O FL MOD SED  11/04/2017   Family History  Problem Relation Age of Onset   Heart attack Mother    Diabetes Mother    Hypertension Mother    Hyperlipidemia Mother    Throat cancer Father    Breast cancer Sister        postmenopausal; double mastectomy   HIV Son    Throat cancer Brother    Throat cancer Brother    Social History   Socioeconomic History    Marital status: Single    Spouse name: Not on file   Number of children: 5   Years of education: Not on file   Highest education level: Not on file  Occupational History   Occupation: retired  Tobacco Use   Smoking status: Never   Smokeless tobacco: Never  Vaping Use   Vaping Use: Never used  Substance and Sexual Activity   Alcohol use: No    Comment: Pt quit drinking a long time ago.   Drug use: No   Sexual activity: Never  Other Topics Concern   Not on file  Social History Narrative   Not on file   Social Determinants of Health   Financial Resource Strain: Not on file  Food Insecurity: Not on file  Transportation Needs: Not on file  Physical Activity: Not on file  Stress: Not on file  Social Connections: Not on file    Tobacco Counseling Counseling given: Not Answered   Clinical Intake:                 Diabetic?no         Activities of Daily Living No flowsheet data found.  Patient Care Team: Banks, Shannon R, MD as PCP - General (Family Medicine)  Indicate any recent Medical Services you may have received from other than [facility] providers in the past year (date may be approximate).     Assessment:   This is a routine wellness examination for Jasmine Carlson.  Hearing/Vision screen No results found.  Dietary issues and exercise activities discussed:     Goals Addressed   None    Depression Screen PHQ 2/9 Scores 07/29/2019 02/05/2017  PHQ - 2 Score 0 0    Fall Risk Fall Risk  07/29/2019 02/05/2017  Falls in the past year? 0 Yes  Number falls in past yr: 0 1  Injury with Fall? 0 -  Risk for fall due to : No Fall Risks -  Follow up Falls evaluation completed -    FALL RISK PREVENTION PERTAINING TO THE HOME:  Any stairs in or around the home? No  If so, are there any without handrails? No  Home free of loose throw rugs in walkways, pet beds, electrical cords, etc? Yes  Adequate lighting in your home to reduce risk of falls? Yes   ASSISTIVE  DEVICES UTILIZED TO PREVENT FALLS:  Life alert? No  Use of a cane, walker or w/c? No  Grab bars in the bathroom? Yes  Shower chair or bench in shower? Yes  Elevated toilet seat or a handicapped toilet? Yes    Cognitive Function:      Normal cognitive status assessed by direct observation by this Nurse Health Advisor. No abnormalities found.    Immunizations Immunization History  Administered Date(s) Administered   Influenza-Unspecified   02/27/2018   PFIZER(Purple Top)SARS-COV-2 Vaccination 08/05/2019, 08/30/2019    TDAP status: Due, Education has been provided regarding the importance of this vaccine. Advised may receive this vaccine at local pharmacy or Health Dept. Aware to provide a copy of the vaccination record if obtained from local pharmacy or Health Dept. Verbalized acceptance and understanding.  Flu Vaccine status: Declined, Education has been provided regarding the importance of this vaccine but patient still declined. Advised may receive this vaccine at local pharmacy or Health Dept. Aware to provide a copy of the vaccination record if obtained from local pharmacy or Health Dept. Verbalized acceptance and understanding.  Pneumococcal vaccine status: Declined,  Education has been provided regarding the importance of this vaccine but patient still declined. Advised may receive this vaccine at local pharmacy or Health Dept. Aware to provide a copy of the vaccination record if obtained from local pharmacy or Health Dept. Verbalized acceptance and understanding.   Covid-19 vaccine status: Completed vaccines  Qualifies for Shingles Vaccine? Yes   Zostavax completed No   Shingrix Completed?: No.    Education has been provided regarding the importance of this vaccine. Patient has been advised to call insurance company to determine out of pocket expense if they have not yet received this vaccine. Advised may also receive vaccine at local pharmacy or Health Dept. Verbalized acceptance  and understanding.  Screening Tests Health Maintenance  Topic Date Due   TETANUS/TDAP  Never done   Zoster Vaccines- Shingrix (1 of 2) Never done   PNA vac Low Risk Adult (1 of 2 - PCV13) Never done   COVID-19 Vaccine (3 - Pfizer risk series) 09/27/2019   OPHTHALMOLOGY EXAM  08/15/2020   INFLUENZA VACCINE  11/27/2020   HEMOGLOBIN A1C  03/16/2021   FOOT EXAM  09/13/2021   DEXA SCAN  Completed   HPV VACCINES  Aged Out    Health Maintenance  Health Maintenance Due  Topic Date Due   TETANUS/TDAP  Never done   Zoster Vaccines- Shingrix (1 of 2) Never done   PNA vac Low Risk Adult (1 of 2 - PCV13) Never done   COVID-19 Vaccine (3 - Pfizer risk series) 09/27/2019   OPHTHALMOLOGY EXAM  08/15/2020    Colorectal cancer screening: No longer required.   Mammogram status: No longer required due to age.  Bone Density status: Completed 05/12/2019. Results reflect: Bone density results: OSTEOPOROSIS. Repeat every 2 years.  Lung Cancer Screening: (Low Dose CT Chest recommended if Age 59-80 years, 30 pack-year currently smoking OR have quit w/in 15years.) does not qualify.   Lung Cancer Screening Referral: n/a  Additional Screening:  Hepatitis C Screening: does not qualify  Vision Screening: Recommended annual ophthalmology exams for early detection of glaucoma and other disorders of the eye. Is the patient up to date with their annual eye exam?  Yes  Who is the provider or what is the name of the office in which the patient attends annual eye exams? [facility]  If pt is not established with a provider, would they like to be referred to a provider to establish care? No .   Dental Screening: Recommended annual dental exams for proper oral hygiene  Community Resource Referral / Chronic Care Management: CRR required this visit?  No   CCM required this visit?  No      Plan:     I have personally reviewed and noted the following in the patient's chart:   Medical and social  history Use of alcohol, tobacco or illicit  drugs  Current medications and supplements including opioid prescriptions. Patient is not currently taking opioid prescriptions. Functional ability and status Nutritional status Physical activity Advanced directives List of other physicians Hospitalizations, surgeries, and ER visits in previous 12 months Vitals Screenings to include cognitive, depression, and falls Referrals and appointments  In addition, I have reviewed and discussed with patient certain preventive protocols, quality metrics, and best practice recommendations. A written personalized care plan for preventive services as well as general preventive health recommendations were provided to patient.     Randel Pigg, LPN   [id number]   Nurse Notes: none

## 2020-10-10 NOTE — Patient Instructions (Signed)
Jasmine Carlson , Thank you for taking time to come for your Medicare Wellness Visit. I appreciate your ongoing commitment to your health goals. Please review the following plan we discussed and let me know if I can assist you in the future.   Screening recommendations/referrals: Colonoscopy: patient declines  Mammogram: patient declines  Bone Density: patient declines  Recommended yearly ophthalmology/optometry visit for glaucoma screening and checkup Recommended yearly dental visit for hygiene and checkup  Vaccinations: Influenza vaccine: due fall 2022 Pneumococcal vaccine: pt declines  Tdap vaccine: pt declines  Shingles vaccine: pt declines   Advanced directives: will provide copies   Conditions/risks identified: none   Next appointment: none   Preventive Care 81 Years and Older, Female Preventive care refers to lifestyle choices and visits with your health care provider that can promote health and wellness. What does preventive care include? A yearly physical exam. This is also called an annual well check. Dental exams once or twice a year. Routine eye exams. Ask your health care provider how often you should have your eyes checked. Personal lifestyle choices, including: Daily care of your teeth and gums. Regular physical activity. Eating a healthy diet. Avoiding tobacco and drug use. Limiting alcohol use. Practicing safe sex. Taking low-dose aspirin every day. Taking vitamin and mineral supplements as recommended by your health care provider. What happens during an annual well check? The services and screenings done by your health care provider during your annual well check will depend on your age, overall health, lifestyle risk factors, and family history of disease. Counseling  Your health care provider may ask you questions about your: Alcohol use. Tobacco use. Drug use. Emotional well-being. Home and relationship well-being. Sexual activity. Eating habits. History  of falls. Memory and ability to understand (cognition). Work and work Statistician. Reproductive health. Screening  You may have the following tests or measurements: Height, weight, and BMI. Blood pressure. Lipid and cholesterol levels. These may be checked every 5 years, or more frequently if you are over 89 years old. Skin check. Lung cancer screening. You may have this screening every year starting at age 33 if you have a 30-pack-year history of smoking and currently smoke or have quit within the past 15 years. Fecal occult blood test (FOBT) of the stool. You may have this test every year starting at age 73. Flexible sigmoidoscopy or colonoscopy. You may have a sigmoidoscopy every 5 years or a colonoscopy every 10 years starting at age 46. Hepatitis C blood test. Hepatitis B blood test. Sexually transmitted disease (STD) testing. Diabetes screening. This is done by checking your blood sugar (glucose) after you have not eaten for a while (fasting). You may have this done every 1-3 years. Bone density scan. This is done to screen for osteoporosis. You may have this done starting at age 83. Mammogram. This may be done every 1-2 years. Talk to your health care provider about how often you should have regular mammograms. Talk with your health care provider about your test results, treatment options, and if necessary, the need for more tests. Vaccines  Your health care provider may recommend certain vaccines, such as: Influenza vaccine. This is recommended every year. Tetanus, diphtheria, and acellular pertussis (Tdap, Td) vaccine. You may need a Td booster every 10 years. Zoster vaccine. You may need this after age 101. Pneumococcal 13-valent conjugate (PCV13) vaccine. One dose is recommended after age 2. Pneumococcal polysaccharide (PPSV23) vaccine. One dose is recommended after age 40. Talk to your health care provider about  which screenings and vaccines you need and how often you need  them. This information is not intended to replace advice given to you by your health care provider. Make sure you discuss any questions you have with your health care provider. Document Released: 05/12/2015 Document Revised: 01/03/2016 Document Reviewed: 02/14/2015 Elsevier Interactive Patient Education  2017 [employer] Prevention in the Home Falls can cause injuries. They can happen to people of all ages. There are many things you can do to make your home safe and to help prevent falls. What can I do on the outside of my home? Regularly fix the edges of walkways and driveways and fix any cracks. Remove anything that might make you trip as you walk through a door, such as a raised step or threshold. Trim any bushes or trees on the path to your home. Use bright outdoor lighting. Clear any walking paths of anything that might make someone trip, such as rocks or tools. Regularly check to see if handrails are loose or broken. Make sure that both sides of any steps have handrails. Any raised decks and porches should have guardrails on the edges. Have any leaves, snow, or ice cleared regularly. Use sand or salt on walking paths during winter. Clean up any spills in your garage right away. This includes oil or grease spills. What can I do in the bathroom? Use night lights. Install grab bars by the toilet and in the tub and shower. Do not use towel bars as grab bars. Use non-skid mats or decals in the tub or shower. If you need to sit down in the shower, use a plastic, non-slip stool. Keep the floor dry. Clean up any water that spills on the floor as soon as it happens. Remove soap buildup in the tub or shower regularly. Attach bath mats securely with double-sided non-slip rug tape. Do not have throw rugs and other things on the floor that can make you trip. What can I do in the bedroom? Use night lights. Make sure that you have a light by your bed that is easy to reach. Do not use  any sheets or blankets that are too big for your bed. They should not hang down onto the floor. Have a firm chair that has side arms. You can use this for support while you get dressed. Do not have throw rugs and other things on the floor that can make you trip. What can I do in the kitchen? Clean up any spills right away. Avoid walking on wet floors. Keep items that you use a lot in easy-to-reach places. If you need to reach something above you, use a strong step stool that has a grab bar. Keep electrical cords out of the way. Do not use floor polish or wax that makes floors slippery. If you must use wax, use non-skid floor wax. Do not have throw rugs and other things on the floor that can make you trip. What can I do with my stairs? Do not leave any items on the stairs. Make sure that there are handrails on both sides of the stairs and use them. Fix handrails that are broken or loose. Make sure that handrails are as long as the stairways. Check any carpeting to make sure that it is firmly attached to the stairs. Fix any carpet that is loose or worn. Avoid having throw rugs at the top or bottom of the stairs. If you do have throw rugs, attach them to the floor with  carpet tape. Make sure that you have a light switch at the top of the stairs and the bottom of the stairs. If you do not have them, ask someone to add them for you. What else can I do to help prevent falls? Wear shoes that: Do not have high heels. Have rubber bottoms. Are comfortable and fit you well. Are closed at the toe. Do not wear sandals. If you use a stepladder: Make sure that it is fully opened. Do not climb a closed stepladder. Make sure that both sides of the stepladder are locked into place. Ask someone to hold it for you, if possible. Clearly mark and make sure that you can see: Any grab bars or handrails. First and last steps. Where the edge of each step is. Use tools that help you move around (mobility aids)  if they are needed. These include: Canes. Walkers. Scooters. Crutches. Turn on the lights when you go into a dark area. Replace any light bulbs as soon as they burn out. Set up your furniture so you have a clear path. Avoid moving your furniture around. If any of your floors are uneven, fix them. If there are any pets around you, be aware of where they are. Review your medicines with your doctor. Some medicines can make you feel dizzy. This can increase your chance of falling. Ask your doctor what other things that you can do to help prevent falls. This information is not intended to replace advice given to you by your health care provider. Make sure you discuss any questions you have with your health care provider. Document Released: 02/09/2009 Document Revised: 09/21/2015 Document Reviewed: 05/20/2014 Elsevier Interactive Patient Education  2017 [employer].

## 2020-10-25 MED ORDER — ONETOUCH VERIO W/DEVICE KIT: 1.0000 | PACK | Freq: Every day | 1 refills | Status: DC

## 2020-10-25 NOTE — Addendum Note (Signed)
Addended by: Westley Hummer B on: 10/25/2020 11:56 AM   Modules accepted: Orders

## 2020-10-27 ENCOUNTER — Telehealth: Payer: Self-pay | Admitting: Family Medicine

## 2020-10-27 DIAGNOSIS — E1140 Type 2 diabetes mellitus with diabetic neuropathy, unspecified: Secondary | ICD-10-CM

## 2020-10-27 MED ORDER — ACCU-CHEK SOFTCLIX LANCETS MISC: 12 refills | Status: DC

## 2020-10-27 MED ORDER — BLOOD GLUCOSE MONITOR KIT: PACK | 0 refills | Status: DC

## 2020-10-27 MED ORDER — ACCU-CHEK GUIDE VI STRP: ORAL_STRIP | 12 refills | Status: DC

## 2020-10-27 NOTE — Telephone Encounter (Signed)
Rx sent 

## 2020-10-27 NOTE — Addendum Note (Signed)
Addended by: Westley Hummer B on: 10/27/2020 01:03 PM   Modules accepted: Orders

## 2020-10-27 NOTE — Telephone Encounter (Signed)
Pt is calling in stating that she is needing a prescription for Accu-chek meter kit, additional test strips and lancets.  Pharm:  [facility] on [address] in [place] 

## 2020-11-20 ENCOUNTER — Telehealth: Payer: Self-pay | Admitting: Family Medicine

## 2020-11-20 NOTE — Telephone Encounter (Signed)
The patient called wanting a Rx sent to a medical supply company for a walker with a seat.   She gave me 2 numbers for the nurse to call. She thinks they are both numbers to call  [phone]  [phone]

## 2020-12-01 NOTE — Telephone Encounter (Signed)
PT called to advise that she is still waiting on getting a Walker.

## 2020-12-11 ENCOUNTER — Other Ambulatory Visit: Payer: Self-pay | Admitting: Family Medicine

## 2020-12-11 DIAGNOSIS — Z8673 Personal history of transient ischemic attack (TIA), and cerebral infarction without residual deficits: Secondary | ICD-10-CM

## 2020-12-11 NOTE — Telephone Encounter (Signed)
RX printed 

## 2020-12-14 ENCOUNTER — Other Ambulatory Visit: Payer: Self-pay | Admitting: Family Medicine

## 2020-12-14 DIAGNOSIS — E1140 Type 2 diabetes mellitus with diabetic neuropathy, unspecified: Secondary | ICD-10-CM

## 2021-02-05 ENCOUNTER — Telehealth: Payer: Self-pay | Admitting: Nurse Practitioner

## 2021-02-05 ENCOUNTER — Encounter: Payer: Self-pay | Admitting: Nurse Practitioner

## 2021-02-05 ENCOUNTER — Inpatient Hospital Stay: Payer: Medicare HMO | Attending: Nurse Practitioner | Admitting: Nurse Practitioner

## 2021-02-05 ENCOUNTER — Other Ambulatory Visit: Payer: Self-pay

## 2021-02-05 ENCOUNTER — Inpatient Hospital Stay: Payer: Medicare HMO

## 2021-02-05 VITALS — BP 163/65 | HR 70 | Temp 97.7°F | Resp 17 | Ht 63.0 in | Wt 161.3 lb

## 2021-02-05 DIAGNOSIS — C50911 Malignant neoplasm of unspecified site of right female breast: Secondary | ICD-10-CM

## 2021-02-05 DIAGNOSIS — E1122 Type 2 diabetes mellitus with diabetic chronic kidney disease: Secondary | ICD-10-CM | POA: Diagnosis not present

## 2021-02-05 DIAGNOSIS — Z923 Personal history of irradiation: Secondary | ICD-10-CM | POA: Diagnosis not present

## 2021-02-05 DIAGNOSIS — Z9221 Personal history of antineoplastic chemotherapy: Secondary | ICD-10-CM | POA: Diagnosis not present

## 2021-02-05 DIAGNOSIS — N189 Chronic kidney disease, unspecified: Secondary | ICD-10-CM | POA: Insufficient documentation

## 2021-02-05 DIAGNOSIS — M810 Age-related osteoporosis without current pathological fracture: Secondary | ICD-10-CM | POA: Diagnosis not present

## 2021-02-05 DIAGNOSIS — Z170 Estrogen receptor positive status [ER+]: Secondary | ICD-10-CM | POA: Insufficient documentation

## 2021-02-05 DIAGNOSIS — Z79811 Long term (current) use of aromatase inhibitors: Secondary | ICD-10-CM | POA: Diagnosis not present

## 2021-02-05 DIAGNOSIS — Z79899 Other long term (current) drug therapy: Secondary | ICD-10-CM | POA: Diagnosis not present

## 2021-02-05 DIAGNOSIS — C50211 Malignant neoplasm of upper-inner quadrant of right female breast: Secondary | ICD-10-CM | POA: Diagnosis not present

## 2021-02-05 DIAGNOSIS — Z7984 Long term (current) use of oral hypoglycemic drugs: Secondary | ICD-10-CM | POA: Insufficient documentation

## 2021-02-05 DIAGNOSIS — I129 Hypertensive chronic kidney disease with stage 1 through stage 4 chronic kidney disease, or unspecified chronic kidney disease: Secondary | ICD-10-CM | POA: Insufficient documentation

## 2021-02-05 LAB — CBC WITH DIFFERENTIAL/PLATELET
Abs Immature Granulocytes: 0.02 10*3/uL (ref 0.00–0.07)
Basophils Absolute: 0 10*3/uL (ref 0.0–0.1)
Basophils Relative: 0 %
Eosinophils Absolute: 0.1 10*3/uL (ref 0.0–0.5)
Eosinophils Relative: 3 %
HCT: 36 % (ref 36.0–46.0)
Hemoglobin: 11.6 g/dL — ABNORMAL LOW (ref 12.0–15.0)
Immature Granulocytes: 0 %
Lymphocytes Relative: 22 %
Lymphs Abs: 1.1 10*3/uL (ref 0.7–4.0)
MCH: 28.2 pg (ref 26.0–34.0)
MCHC: 32.2 g/dL (ref 30.0–36.0)
MCV: 87.4 fL (ref 80.0–100.0)
Monocytes Absolute: 0.5 10*3/uL (ref 0.1–1.0)
Monocytes Relative: 11 %
Neutro Abs: 3.1 10*3/uL (ref 1.7–7.7)
Neutrophils Relative %: 64 %
Platelets: 167 10*3/uL (ref 150–400)
RBC: 4.12 MIL/uL (ref 3.87–5.11)
RDW: 13.8 % (ref 11.5–15.5)
WBC: 5 10*3/uL (ref 4.0–10.5)
nRBC: 0 % (ref 0.0–0.2)

## 2021-02-05 LAB — COMPREHENSIVE METABOLIC PANEL
ALT: 11 U/L (ref 0–44)
AST: 14 U/L — ABNORMAL LOW (ref 15–41)
Albumin: 3.4 g/dL — ABNORMAL LOW (ref 3.5–5.0)
Alkaline Phosphatase: 42 U/L (ref 38–126)
Anion gap: 6 (ref 5–15)
BUN: 15 mg/dL (ref 8–23)
CO2: 26 mmol/L (ref 22–32)
Calcium: 9 mg/dL (ref 8.9–10.3)
Chloride: 108 mmol/L (ref 98–111)
Creatinine, Ser: 1.34 mg/dL — ABNORMAL HIGH (ref 0.44–1.00)
GFR, Estimated: 40 mL/min — ABNORMAL LOW (ref >60)
Glucose, Bld: 112 mg/dL — ABNORMAL HIGH (ref 70–99)
Potassium: 4.4 mmol/L (ref 3.5–5.1)
Sodium: 140 mmol/L (ref 135–145)
Total Bilirubin: 0.4 mg/dL (ref 0.3–1.2)
Total Protein: 6.6 g/dL (ref 6.5–8.1)

## 2021-02-05 NOTE — Telephone Encounter (Signed)
Scheduled follow-up appointment per 10/10 los. Patient is aware. 

## 2021-02-05 NOTE — Progress Notes (Signed)
[place]   [phone] [phone]   Clinic Follow up Note   Patient Care Team: [name], MD as PCP - General (Family Medicine) 02/05/2021  CHIEF COMPLAINT: Follow-up right breast cancer   SUMMARY OF ONCOLOGIC HISTORY: Oncology History Overview Note  Cancer Staging Breast cancer of upper-inner quadrant of right female breast [facility]) Staging form: Breast, AJCC 8th Edition - Pathologic stage from 08/02/2014: Stage IA (pT1c, pN0, cM0, G2, ER: Positive, PR: Positive, HER2: Positive) - Signed by Truitt Merle, MD on 01/04/2017 - Clinical: No stage assigned - Unsigned     Breast cancer of upper-inner quadrant of right female breast ([place])  05/25/2014 Mammogram   Suspicious lesion at 3 o'clock position in the right breast   06/15/2014 Breast US   Suspicious nodule, a spiculated hypoechoic mass, 1.1 cm at the 1 o'clock position   06/30/2014 Initial Biopsy   US Guided Core Biopsy: Invasive Ductal Carcinoma, [place] Grade 2. Addendum: ER positive (100%), PR positive (75-80%), HER2 positive    06/30/2014 Initial Diagnosis   Breast cancer of upper-inner quadrant of right female breast ([place]) from Right Breast Lumpectomy: Invasive Ductal Carcinoma, [place] Grade 2, surgical margins free of carcinoma; Sentinel nodes 2/2 free of tumor   08/02/2014 Surgery   Right Breast Lumpectomy with SLN biopsy; Attending physician Dr. Tamera Punt at [facility] in [place], [place]   08/02/2014 Pathology Results   Right breast lumpectomy: Invasive ductal carcinoma, Duanne Moron Grade 2; size or largest invasive carcinoma 1.4 cm; Sentinel lymph nodes 2/2 free of tumor, margins free of tumor    09/22/2014 Echocardiogram   MUGA scan: normal ejection fraction of 57.7%   10/01/2014 - 10/03/2015 Adjuvant Chemotherapy   Adjuvant weekly Taxol from 06/04/83 to 06/05/76 complicated by fatigue, diarrhea, and leukopenia requiring treatment delays and dosage adjustments. Records indicate she  received 8 of 12 doses. Completed adjuvant Herceptin q3 weeks from 10/01/14 to 10/03/15.    06/12/2015 - 08/04/2015 Radiation Therapy   Received 4,860 cGy to right breast in 27 fractions Received 1,600 cGy boost in 8 fractions    09/12/2015 -  Anti-estrogen oral therapy   Anastrozole 83m daily starting 09/12/15. Due to osteoporosis I switched her to Tamoxifen for 2 additional years in 07/2019. Plan to complete in 2024.    06/25/2016 Imaging   CT Chest, Abdomen, Pelvis with contrast, compared with PET/CT on 04/25/2015 and CT 09/06/2014 - Impression: Chest: 1. Unchanged multiple subcentimeter pulmonary nodules bilaterally.there are no new pulmonary nodules. 2. No thoracic lymphadenopathy. 3. New right lung post radiation changes. 4. New diffuse right breast skin thickening suggestive of post radiation changes. Abdomen/pelvis 1. No new mass or lymphadenopathy in the abdomen and pelvis, no CT evidence of metastatic disease. 2. Unchanged adjacent soft tissue nodules inferior to the right hepatic lobe. 3. Colonic diverticulosis. 4. Status post hysterectomy.   06/29/2016 Mammogram   Digital bilateral mammogram with CAD and TOMO findings: the breast parenchyma demonstrates scattered fibroglandular densities. There are no suspicious calcifications, masses, architectural distortion or skin thickening. Surgical clips are present in the right breast at posterior depth. Vascular calcifications are present. No mammographic evidence of malignancy or referred is is gravida   12/23/2017 Mammogram   12/23/2017 Mammogram IMPRESSION: No mammographic evidence of malignancy in either breast, status post right lumpectomy.   12/25/2018 Mammogram   1. No evidence of new or recurrent breast carcinoma. 2. Benign postsurgical changes on the right.     CURRENT THERAPY:  Anastrozole 177mdaily starting  09/12/15. Due to osteoporosis, switched to Tamoxifen for 2 additional years in 07/2019. Plan to complete in 2024.   INTERVAL  HISTORY: Ms. [name] returns for follow-up as scheduled.  Last seen by Dr. Burr Medico 02/04/2020.  She is overdue for mammogram.  She continues tamoxifen.  Tolerating well.  She has occasional right low back pain she attributes to positioning during sleep, this resolves when she gets up and moving in the mornings.  Denies any other bone or joint pain.  Denies concerns in her breast such as nipple discharge or inversion, new lumps/mass, or skin change.  Denies hot flashes, vaginal bleeding, change in bowel habits from baseline constipation.  All other systems were reviewed with the patient and are negative.  MEDICAL HISTORY:  Past Medical History:  Diagnosis Date   Arthritis    Breast cancer ([place])    Colon polyps    Diabetes mellitus without complication ([place])    GI bleed    Hyperlipidemia    Hypertension    Personal history of chemotherapy    Personal history of radiation therapy    Stroke [facility])     SURGICAL HISTORY: Past Surgical History:  Procedure Laterality Date   ABDOMINAL HYSTERECTOMY     30 years ago   BREAST BIOPSY     BREAST LUMPECTOMY Right 08/02/2014   IR REMOVAL TUN ACCESS W/ PORT W/O FL MOD SED  11/04/2017    I have reviewed the social history and family history with the patient and they are unchanged from previous note.  ALLERGIES:  has No Known Allergies.  MEDICATIONS:  Current Outpatient Medications  Medication Sig Dispense Refill   Accu-Chek Softclix Lancets lancets Use up to four times daily as directed. (FOR ICD-9 250.00, 250.01). 100 each 12   blood glucose meter kit and supplies KIT Dispense based on patient and insurance preference. Use up to four times daily as directed. (FOR ICD-9 250.00, 250.01). 1 each 0   calcium-vitamin D (OSCAL WITH D) 500-200 MG-UNIT tablet Take 1 tablet by mouth daily with breakfast. 90 tablet 3   carvedilol (COREG) 6.25 MG tablet Take 1 tablet (6.25 mg total) by mouth 2 (two) times daily with a meal. 180 tablet 3   clopidogrel (PLAVIX) 75 MG  tablet Take 1 tablet (75 mg total) by mouth daily. 90 tablet 3   gabapentin (NEURONTIN) 300 MG capsule Take 1 capsule (300 mg total) by mouth at bedtime. 90 capsule 1   glucose blood (ACCU-CHEK GUIDE) test strip Use up to four times daily as directed. (FOR ICD-9 250.00, 250.01). 100 each 12   losartan (COZAAR) 100 MG tablet Take 1 tablet (100 mg total) by mouth daily. 90 tablet 3   metFORMIN (GLUCOPHAGE) 500 MG tablet TAKE 1 TABLET (500 MG) BY MOUTH TWICE A DAY WITH BREAKFAST AND DINNER. 180 tablet 1   nystatin (MYCOSTATIN) 100000 UNIT/ML suspension Take 5 mLs (500,000 Units total) by mouth 4 (four) times daily. 60 mL 0   Nystatin POWD 1 application by Does not apply route 2 (two) times daily. 1 Bottle 0   OneTouch Delica Lancets 08X MISC TEST UP TO FOUR TIMES DAILY AS DIRECTED 100 each 5   simvastatin (ZOCOR) 40 MG tablet Take 1 tablet (40 mg total) by mouth every evening. 90 tablet 3   tamoxifen (NOLVADEX) 20 MG tablet Take 1 tablet (20 mg total) by mouth daily. 90 tablet 3   Current Facility-Administered Medications  Medication Dose Route Frequency Provider Last Rate Last Admin   0.9 %  sodium chloride infusion  500 mL Intravenous Once Irene Shipper, MD        PHYSICAL EXAMINATION: ECOG PERFORMANCE STATUS: 0 - Asymptomatic  Vitals:   02/05/21 1055  BP: (!) 163/65  Pulse: 70  Resp: 17  Temp: 97.7 F (36.5 C)  SpO2: 100%   Filed Weights   02/05/21 1055  Weight: 161 lb 4.8 oz (73.2 kg)    GENERAL:alert, no distress and comfortable SKIN: no rash  EYES: sclera clear LYMPH:  no palpable cervical or supraclavicular lymphadenopathy LUNGS:  normal breathing effort HEART:  lower extremity edema Musculoskeletal: No focal tenderness NEURO: alert & oriented x 3 with fluent speech, no focal motor/sensory deficits Breast exam: Breasts are symmetrical without nipple discharge or inversion.  S/p right lumpectomy, incisions completely healed with mild scar tissue.  Right breast is diffusely  firm, no erythema, edema, warmth, or palpable mass in either breast or axilla that I could appreciate.  LABORATORY DATA:  I have reviewed the data as listed CBC Latest Ref Rng & Units 02/05/2021 02/04/2020 08/05/2019  WBC 4.0 - 10.5 K/uL 5.0 6.0 5.0  Hemoglobin 12.0 - 15.0 g/dL 11.6(L) 12.0 12.3  Hematocrit 36.0 - 46.0 % 36.0 39.0 39.2  Platelets 150 - 400 K/uL 167 175 171     CMP Latest Ref Rng & Units 02/05/2021 02/04/2020 08/05/2019  Glucose 70 - 99 mg/dL 112(H) 128(H) 312(H)  BUN 8 - 23 mg/dL '15 18 12  ' Creatinine 0.44 - 1.00 mg/dL 1.34(H) 1.38(H) 1.32(H)  Sodium 135 - 145 mmol/L 140 141 136  Potassium 3.5 - 5.1 mmol/L 4.4 5.1 4.6  Chloride 98 - 111 mmol/L 108 110 102  CO2 22 - 32 mmol/L '26 28 27  ' Calcium 8.9 - 10.3 mg/dL 9.0 9.5 9.3  Total Protein 6.5 - 8.1 g/dL 6.6 7.0 6.9  Total Bilirubin 0.3 - 1.2 mg/dL 0.4 0.3 0.5  Alkaline Phos 38 - 126 U/L 42 51 77  AST 15 - 41 U/L 14(L) 14(L) 13(L)  ALT 0 - 44 U/L '11 11 14      ' RADIOGRAPHIC STUDIES: I have personally reviewed the radiological images as listed and agreed with the findings in the report. No results found.   ASSESSMENT & PLAN: Jasmine Carlson is a 80 y.o. female with    1. 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 S/p right lumpectomy, adjuvant chemo with taxol and 1 year herceptin, and radiation. She started on Anastrozole in 08/2015, switched to tamoxifen 07/2019 due to osteoporosis -on surveillance, 12/2019 mammo was negative. Overdue now -plan to continue Tamoxifen until 2024   2. Bone Health -DEXA 05/12/2019 shows osteoporosis lowest T score -2.7 at the right hip -Due to her CKD she is not a candidate for bisphosphonate -Switched to tamoxifen in 07/2019 -Continue calcium and vitamin D    3. HTN, DM, CKD -f/u with PCP -She likely has component of mild anemia of chronic disease   Disposition: Ms. Hirota is clinically doing  well.  Tolerating tamoxifen without significant side effects.  Exam is benign, labs are stable.  Overall there is no clinical concern for breast cancer recurrence or new malignancy.  She is overdue for annual mammogram, an order was placed today.  Continue tamoxifen until 2024 and surveillance.  She is over 5 years from initial diagnosis, the recurrence risk has decreased.  Continue surveillance, return for lab and office visit in 1 year, or sooner if needed  Orders Placed This Encounter  Procedures   MM 3D SCREEN BREAST BILATERAL    Standing Status:   Future    Standing Expiration Date:   02/05/2022    Order Specific Question:   Reason for Exam (SYMPTOM  OR DIAGNOSIS REQUIRED)    Answer:   overdue screening mammo. R breast cancer 2016    Order Specific Question:   Preferred imaging location?    Answer:   [facility]   All questions were answered. The patient knows to call the clinic with any problems, questions or concerns. No barriers to learning were detected.     Alla Feeling, NP 02/05/21

## 2021-02-06 ENCOUNTER — Other Ambulatory Visit: Payer: Self-pay

## 2021-02-07 ENCOUNTER — Ambulatory Visit (INDEPENDENT_AMBULATORY_CARE_PROVIDER_SITE_OTHER): Payer: Medicare HMO | Admitting: Family Medicine

## 2021-02-07 ENCOUNTER — Encounter: Payer: Self-pay | Admitting: Family Medicine

## 2021-02-07 ENCOUNTER — Ambulatory Visit (INDEPENDENT_AMBULATORY_CARE_PROVIDER_SITE_OTHER): Payer: Medicare HMO

## 2021-02-07 ENCOUNTER — Ambulatory Visit: Payer: Medicare HMO | Admitting: Family Medicine

## 2021-02-07 VITALS — BP 142/78 | HR 67 | Temp 98.3°F | Wt 162.8 lb

## 2021-02-07 DIAGNOSIS — M79645 Pain in left finger(s): Secondary | ICD-10-CM

## 2021-02-07 DIAGNOSIS — I10 Essential (primary) hypertension: Secondary | ICD-10-CM

## 2021-02-07 DIAGNOSIS — W19XXXA Unspecified fall, initial encounter: Secondary | ICD-10-CM

## 2021-02-07 DIAGNOSIS — M19042 Primary osteoarthritis, left hand: Secondary | ICD-10-CM | POA: Diagnosis not present

## 2021-02-07 NOTE — Progress Notes (Signed)
Subjective:    Patient ID: Jasmine Carlson, female    DOB: [birth year], 80 y.o.   MRN: [id number]  Chief Complaint  Patient presents with   [place] over a month ago and hurt thumb, still hurting and would like Xray    HPI Patient was seen today for ongoing concern.  Pt endorses L thumb pain x 1 month s/p fall from standing.  Pt was trying to open the front door of her granddaughter's home when she fell forward onto her L hand/thumb as the door was stuck.  At the time of injury, pt had mild edema.  Pain gradually improving, but still there.  Pt had surgery on L wrist/hand several yrs ago.  Pt had recent f/u with Oncology for h/o R breast cancer s/p lumpectomy, chemo, adjuvant Herceptin and XRT.  Currently on Tamoxifen as Anastrazole caused osteoporosis.  States things are stable.    Pt inquires about what she can take for constipation.  Past Medical History:  Diagnosis Date   Arthritis    Breast cancer ([place])    Colon polyps    Diabetes mellitus without complication ([place])    GI bleed    Hyperlipidemia    Hypertension    Personal history of chemotherapy    Personal history of radiation therapy    Stroke ([place])     No Known Allergies  ROS General: Denies fever, chills, night sweats, changes in weight, changes in appetite HEENT: Denies headaches, ear pain, changes in vision, rhinorrhea, sore throat CV: Denies CP, palpitations, SOB, orthopnea Pulm: Denies SOB, cough, wheezing GI: Denies abdominal pain, nausea, vomiting, diarrhea, constipation GU: Denies dysuria, hematuria, frequency, vaginal discharge Msk: Denies muscle cramps, joint pains + L thumb pain Neuro: Denies weakness, numbness, tingling Skin: Denies rashes, bruising Psych: Denies depression, anxiety, hallucinations  Objective:    Blood pressure (!) 142/78, pulse 67, temperature 98.3 F (36.8 C), temperature source Oral, weight 162 lb 12.8 oz (73.8 kg), SpO2 91 %.  Gen. Pleasant, well-nourished, in no distress,  normal affect   HEENT: [place]/AT, face symmetric, conjunctiva clear, no scleral icterus, PERRLA, EOMI, nares patent without drainage Lungs: no accessory muscle use, CTAB, no wheezes or rales Cardiovascular: RRR, no m/r/g, no peripheral edema Musculoskeletal: L thumb with normal ROM.  TTP of 1st  MCP joint and 1st dorsal interosseous muscle.  No TTP of anatomical snuff box.  No TTP of L wrist.  R hand and wrist normal.  No deformities, no cyanosis or clubbing, normal tone Neuro:  A&Ox3, CN II-XII intact, normal gait Skin:  Warm, no lesions/ rash   Wt Readings from Last 3 Encounters:  02/07/21 162 lb 12.8 oz (73.8 kg)  02/05/21 161 lb 4.8 oz (73.2 kg)  09/13/20 162 lb 9.6 oz (73.8 kg)    Lab Results  Component Value Date   WBC 5.0 02/05/2021   HGB 11.6 (L) 02/05/2021   HCT 36.0 02/05/2021   PLT 167 02/05/2021   GLUCOSE 112 (H) 02/05/2021   CHOL 171 07/29/2019   TRIG 98.0 07/29/2019   HDL 40.90 07/29/2019   LDLCALC 111 (H) 07/29/2019   ALT 11 02/05/2021   AST 14 (L) 02/05/2021   NA 140 02/05/2021   K 4.4 02/05/2021   CL 108 02/05/2021   CREATININE 1.34 (H) 02/05/2021   BUN 15 02/05/2021   CO2 26 02/05/2021   INR 1.15 11/04/2017   HGBA1C 5.9 (A) 09/13/2020   MICROALBUR 2.6 (H) 07/29/2019    Assessment/Plan:  Pain  of left thumb  -Discussed symptoms likely 2/2 sprain of thumb.  Also consider fx.   -Given mechanism of injury discussed Xray to r/o wrist fx -Continue supportive care including heat, topical analgesics, Tylenol as needed -Further recommendations based on imaging - Plan: DG Hand Complete Left  Fall from standing, initial encounter  -Given handout on fall prevention - Plan: DG Hand Complete Left  Essential hypertension -elevated -Discussed lifestyle modifications -Continue losartan 100 mg daily and Coreg 6.25 mg daily. -For continued BP elevation consider dose Pneumovax.  Hesitant to increase Coreg as may cause bradycardia/increased fall risk. -Continue  checking BP at home.  F/u in 1 month for HTN  Grier Mitts, MD

## 2021-02-24 ENCOUNTER — Other Ambulatory Visit: Payer: Self-pay

## 2021-02-24 ENCOUNTER — Ambulatory Visit
Admission: RE | Admit: 2021-02-24 | Discharge: 2021-02-24 | Disposition: A | Discharge status: Home / Self Care | Payer: Medicare HMO | Source: Ambulatory Visit | Attending: Nurse Practitioner | Admitting: Nurse Practitioner

## 2021-02-24 DIAGNOSIS — Z1231 Encounter for screening mammogram for malignant neoplasm of breast: Secondary | ICD-10-CM | POA: Diagnosis not present

## 2021-02-24 DIAGNOSIS — C50211 Malignant neoplasm of upper-inner quadrant of right female breast: Secondary | ICD-10-CM

## 2021-06-22 ENCOUNTER — Other Ambulatory Visit: Payer: Self-pay

## 2021-06-22 ENCOUNTER — Other Ambulatory Visit: Payer: Self-pay | Admitting: *Deleted

## 2021-06-22 DIAGNOSIS — E1140 Type 2 diabetes mellitus with diabetic neuropathy, unspecified: Secondary | ICD-10-CM

## 2021-06-22 MED ORDER — TRUE METRIX BLOOD GLUCOSE TEST VI STRP: ORAL_STRIP | 12 refills | Status: AC

## 2021-06-22 MED ORDER — TRUE METRIX AIR GLUCOSE METER DEVI: 3 refills | Status: AC

## 2021-06-22 MED ORDER — ALCOHOL SWABS PADS: MEDICATED_PAD | 3 refills | Status: AC

## 2021-06-22 MED ORDER — BLOOD GLUCOSE MONITOR KIT: PACK | 0 refills | Status: AC

## 2021-06-22 MED ORDER — TRUEPLUS LANCETS 28G MISC: 3 refills | Status: AC

## 2021-06-22 NOTE — Telephone Encounter (Signed)
Rx done. 

## 2021-07-02 ENCOUNTER — Other Ambulatory Visit: Payer: Self-pay | Admitting: Family Medicine

## 2021-07-02 DIAGNOSIS — I10 Essential (primary) hypertension: Secondary | ICD-10-CM

## 2021-07-02 DIAGNOSIS — Z8673 Personal history of transient ischemic attack (TIA), and cerebral infarction without residual deficits: Secondary | ICD-10-CM

## 2021-07-04 ENCOUNTER — Other Ambulatory Visit: Payer: Self-pay

## 2021-07-04 DIAGNOSIS — Z170 Estrogen receptor positive status [ER+]: Secondary | ICD-10-CM

## 2021-07-04 MED ORDER — TAMOXIFEN CITRATE 20 MG PO TABS: 20.0000 mg | ORAL_TABLET | Freq: Every day | ORAL | 3 refills | Status: DC

## 2021-07-05 ENCOUNTER — Other Ambulatory Visit: Payer: Self-pay | Admitting: Family Medicine

## 2021-08-15 ENCOUNTER — Other Ambulatory Visit: Payer: Self-pay

## 2021-08-15 DIAGNOSIS — C50211 Malignant neoplasm of upper-inner quadrant of right female breast: Secondary | ICD-10-CM

## 2021-08-15 MED ORDER — TAMOXIFEN CITRATE 20 MG PO TABS: 20.0000 mg | ORAL_TABLET | Freq: Every day | ORAL | 3 refills | Status: DC

## 2021-08-29 ENCOUNTER — Telehealth: Payer: Self-pay

## 2021-08-29 NOTE — Telephone Encounter (Signed)
Pt calls with concerns that her Tamoxifen medication has not arrived. This LPN confirmed the pt uses [facility] mail delivery pharmacy in [place]. The pt stated it usually does take 2 weeks or so for the medications to get there and today makes 2 weeks exactly. This LPN confirmed the pharmacy did receive the prescription cause the receipt was confirmed on 4/19 at 4:02pm. She knows to give [place] a call back if she does not get it today or even tomorrow.  ?

## 2021-08-31 ENCOUNTER — Other Ambulatory Visit: Payer: Self-pay

## 2021-08-31 DIAGNOSIS — Z170 Estrogen receptor positive status [ER+]: Secondary | ICD-10-CM

## 2021-08-31 MED ORDER — TAMOXIFEN CITRATE 20 MG PO TABS: 20.0000 mg | ORAL_TABLET | Freq: Every day | ORAL | 3 refills | Status: AC

## 2021-08-31 NOTE — Progress Notes (Signed)
Pt called stating that she needs her Tamoxifen refills to go to [facility] d/t pt changed her insurance this year.  Sent refill to [facility] while on telephone with pt. ?

## 2021-09-06 ENCOUNTER — Encounter: Payer: Self-pay | Admitting: Hematology

## 2021-09-13 ENCOUNTER — Encounter: Payer: Self-pay | Admitting: Family Medicine

## 2021-09-13 ENCOUNTER — Ambulatory Visit (INDEPENDENT_AMBULATORY_CARE_PROVIDER_SITE_OTHER): Payer: Medicare HMO | Admitting: Family Medicine

## 2021-09-13 VITALS — BP 128/70 | HR 65 | Temp 98.5°F | Ht 62.25 in | Wt 160.2 lb

## 2021-09-13 DIAGNOSIS — Z0000 Encounter for general adult medical examination without abnormal findings: Secondary | ICD-10-CM | POA: Diagnosis not present

## 2021-09-13 DIAGNOSIS — I10 Essential (primary) hypertension: Secondary | ICD-10-CM

## 2021-09-13 DIAGNOSIS — H6123 Impacted cerumen, bilateral: Secondary | ICD-10-CM

## 2021-09-13 DIAGNOSIS — Z170 Estrogen receptor positive status [ER+]: Secondary | ICD-10-CM | POA: Diagnosis not present

## 2021-09-13 DIAGNOSIS — N1832 Chronic kidney disease, stage 3b: Secondary | ICD-10-CM

## 2021-09-13 DIAGNOSIS — E1140 Type 2 diabetes mellitus with diabetic neuropathy, unspecified: Secondary | ICD-10-CM | POA: Diagnosis not present

## 2021-09-13 DIAGNOSIS — C50211 Malignant neoplasm of upper-inner quadrant of right female breast: Secondary | ICD-10-CM | POA: Diagnosis not present

## 2021-09-13 LAB — COMPREHENSIVE METABOLIC PANEL
ALT: 11 U/L (ref 0–35)
AST: 16 U/L (ref 0–37)
Albumin: 4 g/dL (ref 3.5–5.2)
Alkaline Phosphatase: 42 U/L (ref 39–117)
BUN: 24 mg/dL — ABNORMAL HIGH (ref 6–23)
CO2: 25 mEq/L (ref 19–32)
Calcium: 9.4 mg/dL (ref 8.4–10.5)
Chloride: 105 mEq/L (ref 96–112)
Creatinine, Ser: 1.55 mg/dL — ABNORMAL HIGH (ref 0.40–1.20)
GFR: 31.1 mL/min — ABNORMAL LOW (ref >60.00)
Glucose, Bld: 118 mg/dL — ABNORMAL HIGH (ref 70–99)
Potassium: 4.5 mEq/L (ref 3.5–5.1)
Sodium: 138 mEq/L (ref 135–145)
Total Bilirubin: 0.3 mg/dL (ref 0.2–1.2)
Total Protein: 7.3 g/dL (ref 6.0–8.3)

## 2021-09-13 LAB — CBC WITH DIFFERENTIAL/PLATELET
Basophils Absolute: 0 10*3/uL (ref 0.0–0.1)
Basophils Relative: 0.4 % (ref 0.0–3.0)
Eosinophils Absolute: 0.1 10*3/uL (ref 0.0–0.7)
Eosinophils Relative: 1.8 % (ref 0.0–5.0)
HCT: 38.1 % (ref 36.0–46.0)
Hemoglobin: 12.3 g/dL (ref 12.0–15.0)
Lymphocytes Relative: 19 % (ref 12.0–46.0)
Lymphs Abs: 1.3 10*3/uL (ref 0.7–4.0)
MCHC: 32.4 g/dL (ref 30.0–36.0)
MCV: 86.3 fl (ref 78.0–100.0)
Monocytes Absolute: 0.6 10*3/uL (ref 0.1–1.0)
Monocytes Relative: 9.4 % (ref 3.0–12.0)
Neutro Abs: 4.7 10*3/uL (ref 1.4–7.7)
Neutrophils Relative %: 69.4 % (ref 43.0–77.0)
Platelets: 162 10*3/uL (ref 150.0–400.0)
RBC: 4.42 Mil/uL (ref 3.87–5.11)
RDW: 14 % (ref 11.5–15.5)
WBC: 6.8 10*3/uL (ref 4.0–10.5)

## 2021-09-13 LAB — LIPID PANEL
Cholesterol: 158 mg/dL (ref 0–200)
HDL: 55.3 mg/dL (ref >39.00)
LDL Cholesterol: 93 mg/dL (ref 0–99)
NonHDL: 102.31
Total CHOL/HDL Ratio: 3
Triglycerides: 48 mg/dL (ref 0.0–149.0)
VLDL: 9.6 mg/dL (ref 0.0–40.0)

## 2021-09-13 LAB — TSH: TSH: 2.16 u[IU]/mL (ref 0.35–5.50)

## 2021-09-13 LAB — T4, FREE: Free T4: 0.89 ng/dL (ref 0.60–1.60)

## 2021-09-13 LAB — HEMOGLOBIN A1C: Hgb A1c MFr Bld: 6.5 % (ref 4.6–6.5)

## 2021-09-13 MED ORDER — DAPAGLIFLOZIN PROPANEDIOL 5 MG PO TABS: 5.0000 mg | ORAL_TABLET | Freq: Every day | ORAL | 3 refills | Status: AC

## 2021-09-13 NOTE — Progress Notes (Signed)
Subjective:     Jasmine Carlson is a 81 y.o. female and is here for a comprehensive physical exam. The patient reports doing well.  [place]  Patient now ambulating with cane which has reduced the pain in her knees.  Patient denies falls.  Patient has glucometer and lancet pen as she is having difficulty loading the lancets.  Patient declines immunizations.  Mammogram done 02/24/2021.  Colonoscopy done 06/25/2017.  Patient requesting medications be sent to [facility] mail order pharmacy.  Social History   Socioeconomic History   Marital status: Single    Spouse name: Not on file   Number of children: 5   Years of education: Not on file   Highest education level: Not on file  Occupational History   Occupation: retired  Tobacco Use   Smoking status: Never   Smokeless tobacco: Never  Vaping Use   Vaping Use: Never used  Substance and Sexual Activity   Alcohol use: No    Comment: Pt quit drinking a long time ago.   Drug use: No   Sexual activity: Never  Other Topics Concern   Not on file  Social History Narrative   Not on file   Social Determinants of Health   Financial Resource Strain: Low Risk    Difficulty of Paying Living Expenses: Not hard at all  Food Insecurity: No Food Insecurity   Worried About Charity fundraiser in the Last Year: Never true   [place] in the Last Year: Never true  Transportation Needs: No Transportation Needs   Lack of Transportation (Medical): No   Lack of Transportation (Non-Medical): No  Physical Activity: Not on file  Stress: No Stress Concern Present   Feeling of Stress : Not at all  Social Connections: Socially Isolated   Frequency of Communication with Friends and Family: Twice a week   Frequency of Social Gatherings with Friends and Family: Twice a week   Attends Religious Services: Never   Printmaker: No   Attends Music therapist: Never   Marital  Status: Divorced  Human resources officer Violence: Not At Risk   Fear of Current or Ex-Partner: No   Emotionally Abused: No   Physically Abused: No   Sexually Abused: No   Health Maintenance  Topic Date Due   TETANUS/TDAP  Never done   Zoster Vaccines- Shingrix (1 of 2) Never done   Pneumonia Vaccine 60+ Years old (1 - PCV) Never done   COVID-19 Vaccine (3 - Pfizer risk series) 09/27/2019   OPHTHALMOLOGY EXAM  08/15/2020   HEMOGLOBIN A1C  03/16/2021   FOOT EXAM  09/13/2021   INFLUENZA VACCINE  11/27/2021   DEXA SCAN  Completed   HPV VACCINES  Aged Out    The following portions of the patient's history were reviewed and updated as appropriate: allergies, current medications, past family history, past medical history, past social history, past surgical history, and problem list.  Review of Systems Pertinent items noted in HPI and remainder of comprehensive ROS otherwise negative.   Objective:    BP 128/70 (BP Location: Left Arm, Patient Position: Sitting, Cuff Size: Normal)   Pulse 65   Temp 98.5 F (36.9 C) (Oral)   Ht 5' 2.25" (1.581 m)   Wt 160 lb 3.2 oz (72.7 kg)   SpO2 98%   BMI 29.07 kg/m  General appearance: alert, cooperative, and no distress ,decreased hearing. Head: Normocephalic, without  obvious abnormality, atraumatic Eyes: conjunctivae/corneas clear. PERRL, EOM's intact. Fundi benign. Ears:  Bilateral external ears normal.  Unable to visualize TMs 2/2 bilateral cerumen impaction. TMs normal after irrigation. Nose: Nares normal. Septum midline. Mucosa normal. No drainage or sinus tenderness. Throat: lips, mucosa, and tongue normal; teeth and gums normal and wearing upper dentures Neck: no adenopathy, no carotid bruit, no JVD, supple, symmetrical, trachea midline, and thyroid not enlarged, symmetric, no tenderness/mass/nodules Lungs: clear to auscultation bilaterally Heart: regular rate and rhythm, S1, S2 normal, no murmur, click, rub or gallop Abdomen: soft,  non-tender; bowel sounds normal; no masses,  no organomegaly Extremities: extremities normal, atraumatic, no cyanosis or edema Pulses: 2+ and symmetric Skin: Skin color, texture, turgor normal. No rashes or lesions Lymph nodes: Cervical, supraclavicular, and axillary nodes normal. Neurologic: Alert and oriented X 3, normal strength and tone. Normal symmetric reflexes. Normal coordination and gait     Assessment:    Healthy female exam with decreased hearing likely from bilateral cerumen impaction.   Plan:    Anticipatory guidance given including wearing seatbelts, smoke detectors in the home, increasing physical activity, increasing p.o. intake of water and vegetables. -Obtain labs -Mammogram up-to-date done 02/24/2021 -Colonoscopy up-to-date done 06/25/2017 -Pap not indicated 2/2 age -Immunizations reviewed.  Discussed pneumonia and shingles vaccine however patient declines -Given handout -Next CPE in 1 year See After Visit Summary for Counseling Recommendations  Essential hypertension -Controlled -Continue current medications including Coreg 6.25 mg twice daily, losartan 100 mg daily - Plan: CBC with Differential/Platelet, TSH, T4, Free, Lipid panel, CMP  Chronic kidney disease, stage 3b (H[facility])  - Plan: CBC with Differential/Platelet, CMP  Bilateral impacted cerumen -Consent obtained.  Bilateral ears irrigated.  Patient tolerated procedure well. -OTC Debrox eardrops as needed  Type 2 diabetes mellitus with diabetic neuropathy, without long-term current use of insulin ([place]) -Well-controlled -Hemoglobin A1c 5.9% on 09/13/2020 -Continue lifestyle modifications -Will d/c Metformin 2/2 CKD 3b -discussed r/b/a of Farxiga.  Will start farxiga 5 mg daily -Continue monitoring blood sugar. -Continue ARB and statin  - Plan: Hemoglobin A1c, Lipid panel  Follow up in 2-3 months, sooner if needed  Grier Mitts, MD

## 2021-09-13 NOTE — Patient Instructions (Addendum)
A prescription for Farxiga 5 mg daily was sent to your mail order pharmacy.  This medication is to help with your diabetes.  Stop taking metformin.  With Wilder Glade it is important that you are drinking plenty of water each day.  For some people Wilder Glade can cause urinary tract infections or vaginal yeast infections.  If you experience either please let us know.  Please notify the clinic if this medication is not affordable or covered by her insurance.

## 2021-09-21 ENCOUNTER — Other Ambulatory Visit: Payer: Self-pay | Admitting: Family Medicine

## 2021-09-21 DIAGNOSIS — N1832 Chronic kidney disease, stage 3b: Secondary | ICD-10-CM

## 2021-10-03 ENCOUNTER — Other Ambulatory Visit: Payer: Self-pay

## 2021-10-03 DIAGNOSIS — Z8673 Personal history of transient ischemic attack (TIA), and cerebral infarction without residual deficits: Secondary | ICD-10-CM

## 2021-10-03 DIAGNOSIS — I10 Essential (primary) hypertension: Secondary | ICD-10-CM

## 2021-10-03 MED ORDER — CLOPIDOGREL BISULFATE 75 MG PO TABS: 75.0000 mg | ORAL_TABLET | Freq: Every day | ORAL | 1 refills | Status: DC

## 2021-10-03 MED ORDER — CARVEDILOL 6.25 MG PO TABS: 6.2500 mg | ORAL_TABLET | Freq: Two times a day (BID) | ORAL | 1 refills | Status: DC

## 2021-10-03 MED ORDER — LOSARTAN POTASSIUM 100 MG PO TABS: 100.0000 mg | ORAL_TABLET | Freq: Every day | ORAL | 1 refills | Status: DC

## 2021-10-03 MED ORDER — SIMVASTATIN 40 MG PO TABS: 40.0000 mg | ORAL_TABLET | Freq: Every evening | ORAL | 1 refills | Status: DC

## 2021-10-15 ENCOUNTER — Other Ambulatory Visit: Payer: Self-pay

## 2021-10-15 NOTE — Telephone Encounter (Signed)
A user error has taken place: medication ordered in error, not dispensed to this patient.

## 2021-10-16 ENCOUNTER — Ambulatory Visit (INDEPENDENT_AMBULATORY_CARE_PROVIDER_SITE_OTHER): Payer: Medicare HMO

## 2021-10-16 VITALS — Ht 62.0 in | Wt 160.0 lb

## 2021-10-16 DIAGNOSIS — Z0000 Encounter for general adult medical examination without abnormal findings: Secondary | ICD-10-CM | POA: Diagnosis not present

## 2021-10-16 NOTE — Patient Instructions (Addendum)
Jasmine Carlson , Thank you for taking time to come for your Medicare Wellness Visit. I appreciate your ongoing commitment to your health goals. Please review the following plan we discussed and let me know if I can assist you in the future.   These are the goals we discussed:  Goals       No current goals (pt-stated)        This is a list of the screening recommended for you and due dates:  Health Maintenance  Topic Date Due   COVID-19 Vaccine (3 - Pfizer risk series) 09/27/2019   Eye exam for diabetics  08/15/2020   Complete foot exam   09/13/2021   Zoster (Shingles) Vaccine (1 of 2) 12/14/2021*   Pneumonia Vaccine (1 - PCV) 09/14/2022*   Tetanus Vaccine  09/14/2022*   Flu Shot  11/27/2021   Hemoglobin A1C  03/16/2022   DEXA scan (bone density measurement)  Completed   HPV Vaccine  Aged Out  *Topic was postponed. The date shown is not the original due date.    Advanced directives: No   Conditions/risks identified: None  Next appointment: Follow up in one year for your annual wellness visit     Preventive Care 65 Years and Older, Female Preventive care refers to lifestyle choices and visits with your health care provider that can promote health and wellness. What does preventive care include? A yearly physical exam. This is also called an annual well check. Dental exams once or twice a year. Routine eye exams. Ask your health care provider how often you should have your eyes checked. Personal lifestyle choices, including: Daily care of your teeth and gums. Regular physical activity. Eating a healthy diet. Avoiding tobacco and drug use. Limiting alcohol use. Practicing safe sex. Taking low-dose aspirin every day. Taking vitamin and mineral supplements as recommended by your health care provider. What happens during an annual well check? The services and screenings done by your health care provider during your annual well check will depend on your age, overall health,  lifestyle risk factors, and family history of disease. Counseling  Your health care provider may ask you questions about your: Alcohol use. Tobacco use. Drug use. Emotional well-being. Home and relationship well-being. Sexual activity. Eating habits. History of falls. Memory and ability to understand (cognition). Work and work Statistician. Reproductive health. Screening  You may have the following tests or measurements: Height, weight, and BMI. Blood pressure. Lipid and cholesterol levels. These may be checked every 5 years, or more frequently if you are over 26 years old. Skin check. Lung cancer screening. You may have this screening every year starting at age 17 if you have a 30-pack-year history of smoking and currently smoke or have quit within the past 15 years. Fecal occult blood test (FOBT) of the stool. You may have this test every year starting at age 31. Flexible sigmoidoscopy or colonoscopy. You may have a sigmoidoscopy every 5 years or a colonoscopy every 10 years starting at age 7. Hepatitis C blood test. Hepatitis B blood test. Sexually transmitted disease (STD) testing. Diabetes screening. This is done by checking your blood sugar (glucose) after you have not eaten for a while (fasting). You may have this done every 1-3 years. Bone density scan. This is done to screen for osteoporosis. You may have this done starting at age 14. Mammogram. This may be done every 1-2 years. Talk to your health care provider about how often you should have regular mammograms. Talk with your health  care provider about your test results, treatment options, and if necessary, the need for more tests. Vaccines  Your health care provider may recommend certain vaccines, such as: Influenza vaccine. This is recommended every year. Tetanus, diphtheria, and acellular pertussis (Tdap, Td) vaccine. You may need a Td booster every 10 years. Zoster vaccine. You may need this after age  41. Pneumococcal 13-valent conjugate (PCV13) vaccine. One dose is recommended after age 17. Pneumococcal polysaccharide (PPSV23) vaccine. One dose is recommended after age 73. Talk to your health care provider about which screenings and vaccines you need and how often you need them. This information is not intended to replace advice given to you by your health care provider. Make sure you discuss any questions you have with your health care provider. Document Released: 05/12/2015 Document Revised: 01/03/2016 Document Reviewed: 02/14/2015 Elsevier Interactive Patient Education  2017 [employer] Prevention in the Home Falls can cause injuries. They can happen to people of all ages. There are many things you can do to make your home safe and to help prevent falls. What can I do on the outside of my home? Regularly fix the edges of walkways and driveways and fix any cracks. Remove anything that might make you trip as you walk through a door, such as a raised step or threshold. Trim any bushes or trees on the path to your home. Use bright outdoor lighting. Clear any walking paths of anything that might make someone trip, such as rocks or tools. Regularly check to see if handrails are loose or broken. Make sure that both sides of any steps have handrails. Any raised decks and porches should have guardrails on the edges. Have any leaves, snow, or ice cleared regularly. Use sand or salt on walking paths during winter. Clean up any spills in your garage right away. This includes oil or grease spills. What can I do in the bathroom? Use night lights. Install grab bars by the toilet and in the tub and shower. Do not use towel bars as grab bars. Use non-skid mats or decals in the tub or shower. If you need to sit down in the shower, use a plastic, non-slip stool. Keep the floor dry. Clean up any water that spills on the floor as soon as it happens. Remove soap buildup in the tub or shower  regularly. Attach bath mats securely with double-sided non-slip rug tape. Do not have throw rugs and other things on the floor that can make you trip. What can I do in the bedroom? Use night lights. Make sure that you have a light by your bed that is easy to reach. Do not use any sheets or blankets that are too big for your bed. They should not hang down onto the floor. Have a firm chair that has side arms. You can use this for support while you get dressed. Do not have throw rugs and other things on the floor that can make you trip. What can I do in the kitchen? Clean up any spills right away. Avoid walking on wet floors. Keep items that you use a lot in easy-to-reach places. If you need to reach something above you, use a strong step stool that has a grab bar. Keep electrical cords out of the way. Do not use floor polish or wax that makes floors slippery. If you must use wax, use non-skid floor wax. Do not have throw rugs and other things on the floor that can make you trip. What can  I do with my stairs? Do not leave any items on the stairs. Make sure that there are handrails on both sides of the stairs and use them. Fix handrails that are broken or loose. Make sure that handrails are as long as the stairways. Check any carpeting to make sure that it is firmly attached to the stairs. Fix any carpet that is loose or worn. Avoid having throw rugs at the top or bottom of the stairs. If you do have throw rugs, attach them to the floor with carpet tape. Make sure that you have a light switch at the top of the stairs and the bottom of the stairs. If you do not have them, ask someone to add them for you. What else can I do to help prevent falls? Wear shoes that: Do not have high heels. Have rubber bottoms. Are comfortable and fit you well. Are closed at the toe. Do not wear sandals. If you use a stepladder: Make sure that it is fully opened. Do not climb a closed stepladder. Make sure that  both sides of the stepladder are locked into place. Ask someone to hold it for you, if possible. Clearly mark and make sure that you can see: Any grab bars or handrails. First and last steps. Where the edge of each step is. Use tools that help you move around (mobility aids) if they are needed. These include: Canes. Walkers. Scooters. Crutches. Turn on the lights when you go into a dark area. Replace any light bulbs as soon as they burn out. Set up your furniture so you have a clear path. Avoid moving your furniture around. If any of your floors are uneven, fix them. If there are any pets around you, be aware of where they are. Review your medicines with your doctor. Some medicines can make you feel dizzy. This can increase your chance of falling. Ask your doctor what other things that you can do to help prevent falls. This information is not intended to replace advice given to you by your health care provider. Make sure you discuss any questions you have with your health care provider. Document Released: 02/09/2009 Document Revised: 09/21/2015 Document Reviewed: 05/20/2014 Elsevier Interactive Patient Education  2017 [employer].

## 2021-10-16 NOTE — Progress Notes (Signed)
Subjective:   Jasmine Carlson is a 81 y.o. female who presents for Medicare Annual (Subsequent) preventive examination.  Review of Systems    Virtual Visit via Telephone Note  I connected with  Jasmine Carlson on 10/16/21 at 10:15 AM EDT by telephone and verified that I am speaking with the correct person using two identifiers.  Location: Patient: Home Provider: Office Persons participating in the virtual visit: patient/Nurse Health Advisor   I discussed the limitations, risks, security and privacy concerns of performing an evaluation and management service by telephone and the availability of in person appointments. The patient expressed understanding and agreed to proceed.  Interactive audio and video telecommunications were attempted between this nurse and patient, however failed, due to patient having technical difficulties OR patient did not have access to video capability.  We continued and completed visit with audio only.  Some vital signs may be absent or patient reported.   Jasmine Peaches, LPN  Cardiac Risk Factors include: advanced age (>61mn, >>30women);diabetes mellitus;hypertension     Objective:    Today's Vitals   10/16/21 1023  Weight: 160 lb (72.6 kg)  Height: '5\' 2"'  (1.575 m)   Body mass index is 29.26 kg/m.     10/16/2021   10:33 AM 10/10/2020   10:43 AM 02/04/2020    9:45 AM 11/04/2017   11:52 AM 06/25/2017    7:56 AM 05/06/2017    9:25 AM 12/06/2016    9:25 PM  Advanced Directives  Does Patient Have a Medical Advance Directive? No Yes No No No No No  Type of ASocial research officer, governmentLiving will       Copy of [place]in Chart?  No - copy requested       Would patient like information on creating a medical advance directive? No - Patient declined   Yes (MAU/Ambulatory/Procedural Areas - Information given)       Current Medications (verified) Outpatient Encounter Medications as of 10/16/2021  Medication Sig    Accu-Chek Softclix Lancets lancets Use up to four times daily as directed. (FOR ICD-9 250.00, 250.01). (Patient not taking: Reported on 09/13/2021)   Alcohol Swabs PADS Use as directed (Patient not taking: Reported on 09/13/2021)   blood glucose meter kit and supplies KIT Dispense based on patient and insurance preference. Use up to four times daily as directed. (FOR ICD-9 250.00, 250.01). (Patient not taking: Reported on 09/13/2021)   Blood Glucose Monitoring Suppl (TRUE METRIX AIR GLUCOSE METER) DEVI Use as directed (Patient not taking: Reported on 09/13/2021)   calcium-vitamin D (OSCAL WITH D) 500-200 MG-UNIT tablet Take 1 tablet by mouth daily with breakfast. (Patient not taking: Reported on 09/13/2021)   carvedilol (COREG) 6.25 MG tablet Take 1 tablet (6.25 mg total) by mouth 2 (two) times daily with a meal.   clopidogrel (PLAVIX) 75 MG tablet Take 1 tablet (75 mg total) by mouth daily.   dapagliflozin propanediol (FARXIGA) 5 MG TABS tablet Take 1 tablet (5 mg total) by mouth daily before breakfast.   gabapentin (NEURONTIN) 300 MG capsule TAKE 1 CAPSULE AT BEDTIME   glucose blood (ACCU-CHEK GUIDE) test strip Use up to four times daily as directed. (FOR ICD-9 250.00, 250.01). (Patient not taking: Reported on 09/13/2021)   glucose blood (TRUE METRIX BLOOD GLUCOSE TEST) test strip Use as instructed (Patient not taking: Reported on 09/13/2021)   losartan (COZAAR) 100 MG tablet Take 1 tablet (100 mg total) by mouth daily.  nystatin (MYCOSTATIN) 100000 UNIT/ML suspension Take 5 mLs (500,000 Units total) by mouth 4 (four) times daily. (Patient not taking: Reported on 09/13/2021)   Nystatin POWD 1 application by Does not apply route 2 (two) times daily. (Patient not taking: Reported on 3/87/5643)   OneTouch Delica Lancets 32R MISC TEST UP TO FOUR TIMES DAILY AS DIRECTED (Patient not taking: Reported on 09/13/2021)   simvastatin (ZOCOR) 40 MG tablet Take 1 tablet (40 mg total) by mouth every evening.   tamoxifen  (NOLVADEX) 20 MG tablet Take 1 tablet (20 mg total) by mouth daily. (Patient not taking: Reported on 09/13/2021)   TRUEplus Lancets 28G MISC Use as directed (Patient not taking: Reported on 09/13/2021)   Facility-Administered Encounter Medications as of 10/16/2021  Medication   0.9 %  sodium chloride infusion    Allergies (verified) Patient has no known allergies.   History: Past Medical History:  Diagnosis Date   Arthritis    Breast cancer ([place])    Colon polyps    Diabetes mellitus without complication ([place])    GI bleed    Hyperlipidemia    Hypertension    Personal history of chemotherapy    Personal history of radiation therapy    Stroke [facility])    Past Surgical History:  Procedure Laterality Date   ABDOMINAL HYSTERECTOMY     30 years ago   BREAST BIOPSY     BREAST LUMPECTOMY Right 08/02/2014   IR REMOVAL TUN ACCESS W/ PORT W/O FL MOD SED  11/04/2017   Family History  Problem Relation Age of Onset   Heart attack Mother    Diabetes Mother    Hypertension Mother    Hyperlipidemia Mother    Throat cancer Father    Breast cancer Sister        postmenopausal; double mastectomy   HIV Son    Throat cancer Brother    Throat cancer Brother    Social History   Socioeconomic History   Marital status: Single    Spouse name: Not on file   Number of children: 5   Years of education: Not on file   Highest education level: Not on file  Occupational History   Occupation: retired  Tobacco Use   Smoking status: Never   Smokeless tobacco: Never  Vaping Use   Vaping Use: Never used  Substance and Sexual Activity   Alcohol use: No    Comment: Pt quit drinking a long time ago.   Drug use: No   Sexual activity: Never  Other Topics Concern   Not on file  Social History Narrative   Not on file   Social Determinants of Health   Financial Resource Strain: Low Risk  (10/16/2021)   Overall Financial Resource Strain (CARDIA)    Difficulty of Paying Living Expenses: Not hard at  all  Food Insecurity: No Food Insecurity (10/16/2021)   Hunger Vital Sign    Worried About Running Out of Food in the Last Year: Never true    Ran Out of Food in the Last Year: Never true  Transportation Needs: No Transportation Needs (10/16/2021)   PRAPARE - Hydrologist (Medical): No    Lack of Transportation (Non-Medical): No  Physical Activity: Inactive (10/16/2021)   Exercise Vital Sign    Days of Exercise per Week: 0 days    Minutes of Exercise per Session: 0 min  Stress: No Stress Concern Present (10/16/2021)   [place]  Feeling of Stress : Not at all  Social Connections: Socially Isolated (10/16/2021)   Social Connection and Isolation Panel [NHANES]    Frequency of Communication with Friends and Family: More than three times a week    Frequency of Social Gatherings with Friends and Family: More than three times a week    Attends Religious Services: Never    Marine scientist or Organizations: No    Attends Music therapist: Never    Marital Status: Divorced     Clinical Intake:  Pre-visit preparation completed: NoNutrition Risk Assessment:  Has the patient had any N/V/D within the last 2 months?  No  Does the patient have any non-healing wounds?  No  Has the patient had any unintentional weight loss or weight gain?  No   Diabetes:  Is the patient diabetic?  Yes  If diabetic, was a CBG obtained today?  No  Did the patient bring in their glucometer from home?  No  How often do you monitor your CBG's? Daily.   Financial Strains and Diabetes Management:  Are you having any financial strains with the device, your supplies or your medication? No .  Does the patient want to be seen by Chronic Care Management for management of their diabetes?  No  Would the patient like to be referred to a Nutritionist or for Diabetic Management?  No   Diabetic  Exams:  Diabetic Eye Exam: Completed Yes. Overdue for diabetic eye exam. Pt has been advised about the importance in completing this exam. A referral has been placed today. Message sent to referral coordinator for scheduling purposes. Advised pt to expect a call from office referred to regarding appt.  Diabetic Foot Exam: Completed Yes. Pt has been advised about the importance in completing this exam. Pt is scheduled for diabetic foot exam on Followed by PCP.    Pain : No/denies pain  Diabetic?  Yes    Activities of Daily Living    10/16/2021   10:30 AM  In your present state of health, do you have any difficulty performing the following activities:  Hearing? 0  Vision? 0  Difficulty concentrating or making decisions? 0  Walking or climbing stairs? 1  Comment Due to Knee pain  Dressing or bathing? 0  Doing errands, shopping? 0  Preparing Food and eating ? N  Using the Toilet? N  In the past six months, have you accidently leaked urine? N  Do you have problems with loss of bowel control? N  Managing your Medications? N  Managing your Finances? N  Housekeeping or managing your Housekeeping? N    Patient Care Team: [name], MD as PCP - General (Family Medicine)  Indicate any recent Medical Services you may have received from other than [facility] providers in the past year (date may be approximate).     Assessment:   This is a routine wellness examination for Shanaia.  Hearing/Vision screen Hearing Screening - Comments:: No hearing difficulty Vision Screening - Comments:: Wears glasses. Followed by Suzie Portela  Dietary issues and exercise activities discussed: Exercise limited by: None identified   Goals Addressed               This Visit's Progress     No current goals (pt-stated)         Depression Screen    10/16/2021   10:27 AM 09/13/2021    9:08 AM 10/10/2020   10:44 AM 10/10/2020   10:40 AM 07/29/2019  7:54 AM 02/05/2017   10:21 AM  PHQ 2/9 Scores  PHQ  - 2 Score 0 3 0 0 0 0  PHQ- 9 Score 0 15        Fall Risk    10/16/2021   10:32 AM 09/13/2021    9:08 AM 10/10/2020   10:44 AM 07/29/2019    7:53 AM 02/05/2017   10:21 AM  Fall Risk   Falls in the past year? 0 0 0 0 Yes  Number falls in past yr: 0 0 0 0 1  Injury with Fall? 0 0 0 0   Risk for fall due to : No Fall Risks No Fall Risks No Fall Risks No Fall Risks   Follow up  Falls evaluation completed  Falls evaluation completed     FALL RISK PREVENTION PERTAINING TO THE HOME:  Any stairs in or around the home? Yes  If so, are there any without handrails? No  Home free of loose throw rugs in walkways, pet beds, electrical cords, etc? Yes  Adequate lighting in your home to reduce risk of falls? Yes   ASSISTIVE DEVICES UTILIZED TO PREVENT FALLS:  Life alert? Yes  Use of a cane, walker or w/c? Yes  Grab bars in the bathroom? Yes  Shower chair or bench in shower? No  Elevated toilet seat or a handicapped toilet? No   TIMED UP AND GO:  Was the test performed? No . Audio Visit  Cognitive Function:    Immunizations Immunization History  Administered Date(s) Administered   Influenza-Unspecified 02/27/2018   PFIZER(Purple Top)SARS-COV-2 Vaccination 08/05/2019, 08/30/2019    Covid-19 vaccine status: Completed vaccines  Qualifies for Shingles Vaccine? Yes   Zostavax completed No   Shingrix Completed?: No.    Education has been provided regarding the importance of this vaccine. Patient has been advised to call insurance company to determine out of pocket expense if they have not yet received this vaccine. Advised may also receive vaccine at local pharmacy or Health Dept. Verbalized acceptance and understanding.  Screening Tests Health Maintenance  Topic Date Due   COVID-19 Vaccine (3 - Pfizer risk series) 09/27/2019   OPHTHALMOLOGY EXAM  08/15/2020   FOOT EXAM  09/13/2021   Zoster Vaccines- Shingrix (1 of 2) 12/14/2021 (Originally 08/06/1959)   Pneumonia Vaccine 34+ Years old  (1 - PCV) 09/14/2022 (Originally 08/05/2004)   TETANUS/TDAP  09/14/2022 (Originally 08/06/1959)   INFLUENZA VACCINE  11/27/2021   HEMOGLOBIN A1C  03/16/2022   DEXA SCAN  Completed   HPV VACCINES  Aged Out    Health Maintenance  Health Maintenance Due  Topic Date Due   COVID-19 Vaccine (3 - Pfizer risk series) 09/27/2019   OPHTHALMOLOGY EXAM  08/15/2020   FOOT EXAM  09/13/2021    Colorectal cancer screening: No longer required.   Mammogram status: No longer required due to Age.  Bone Density status: Completed 05/12/19. Results reflect: Bone density results: OSTEOPOROSIS. Repeat every   years.  Lung Cancer Screening: (Low Dose CT Chest recommended if Age 60-80 years, 30 pack-year currently smoking OR have quit w/in 15years.) does not qualify.     Additional Screening:  Hepatitis C Screening: does not qualify; Completed   Vision Screening: Recommended annual ophthalmology exams for early detection of glaucoma and other disorders of the eye. Is the patient up to date with their annual eye exam?  Yes  Who is the provider or what is the name of the office in which the patient attends annual eye exams? [facility] If pt is not established with a provider, would they like to be referred to a provider to establish care? No .   Dental Screening: Recommended annual dental exams for proper oral hygiene  Community Resource Referral / Chronic Care Management:  CRR required this visit?  No   CCM required this visit?  No      Plan:     I have personally reviewed and noted the following in the patient's chart:   Medical and social history Use of alcohol, tobacco or illicit drugs  Current medications and supplements including opioid prescriptions.  Functional ability and status Nutritional status Physical activity Advanced directives List of other physicians Hospitalizations, surgeries, and ER visits in previous 12 months Vitals Screenings to include cognitive, depression,  and falls Referrals and appointments  In addition, I have reviewed and discussed with patient certain preventive protocols, quality metrics, and best practice recommendations. A written personalized care plan for preventive services as well as general preventive health recommendations were provided to patient.     Jasmine Peaches, LPN   4/43/9265   Nurse Notes: None

## 2021-10-29 ENCOUNTER — Telehealth: Payer: Self-pay | Admitting: Family Medicine

## 2021-10-29 NOTE — Telephone Encounter (Signed)
Pt called to say she has a missed call from this office but does not know who could have called her. I checked telephone encounters and notes and could not decipher who it might have been.  Please call her back at [phone]

## 2021-10-31 NOTE — Telephone Encounter (Signed)
See result note from 09/13/21.

## 2021-11-06 ENCOUNTER — Telehealth: Payer: Self-pay

## 2021-11-06 NOTE — Telephone Encounter (Signed)
---  Caller want to know why she was taken off metformin. She was prescribed farxiga. She wants to know if she should be taking farxiga. No current symptoms. Caller advised to speak with PCP regarding questions about why the change. She reports that she was notified of renal disease last week. No current blood glucose available, no current symptoms. Caller advised to contact PCP when office is open for definitive answer on why medication has been changed. Caller advised that it could be related to CKD.  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  Will start farxiga 5 mg daily -Continue monitoring blood sugar. -Continue ARB and statin  - Plan: Hemoglobin A1c, Lipid panel  11/06/21 1007: Pt notified of notes from above & verb understanding.

## 2022-01-16 ENCOUNTER — Encounter: Payer: Self-pay | Admitting: Family Medicine

## 2022-01-16 ENCOUNTER — Ambulatory Visit (INDEPENDENT_AMBULATORY_CARE_PROVIDER_SITE_OTHER): Payer: Medicare HMO | Admitting: Family Medicine

## 2022-01-16 VITALS — BP 132/78 | HR 70 | Temp 98.4°F | Wt 155.8 lb

## 2022-01-16 DIAGNOSIS — Z8673 Personal history of transient ischemic attack (TIA), and cerebral infarction without residual deficits: Secondary | ICD-10-CM

## 2022-01-16 DIAGNOSIS — C50211 Malignant neoplasm of upper-inner quadrant of right female breast: Secondary | ICD-10-CM | POA: Diagnosis not present

## 2022-01-16 DIAGNOSIS — N1832 Chronic kidney disease, stage 3b: Secondary | ICD-10-CM | POA: Diagnosis not present

## 2022-01-16 DIAGNOSIS — Z170 Estrogen receptor positive status [ER+]: Secondary | ICD-10-CM

## 2022-01-16 DIAGNOSIS — E1140 Type 2 diabetes mellitus with diabetic neuropathy, unspecified: Secondary | ICD-10-CM | POA: Diagnosis not present

## 2022-01-16 DIAGNOSIS — I10 Essential (primary) hypertension: Secondary | ICD-10-CM

## 2022-01-16 DIAGNOSIS — E782 Mixed hyperlipidemia: Secondary | ICD-10-CM | POA: Diagnosis not present

## 2022-01-16 DIAGNOSIS — M542 Cervicalgia: Secondary | ICD-10-CM

## 2022-01-16 LAB — MICROALBUMIN / CREATININE URINE RATIO
Creatinine,U: 29.7 mg/dL
Microalb Creat Ratio: 2.4 mg/g (ref 0.0–30.0)
Microalb, Ur: <0.7 mg/dL (ref 0.0–1.9)

## 2022-01-16 NOTE — Progress Notes (Signed)
Subjective:    Patient ID: Jasmine Carlson, female    DOB: 1940/09/04, 81 y.o.   MRN: [id number]  No chief complaint on file.   HPI Patient is an 81 year old female with pmh sig for CVA, HTN, HLD, GIB, DM 2, R breast cancer  s/p XRT and chemo currently on tamoxifen, anticoagulation was seen today for f/u.  Pt states she is doing well overall.  Pt moving back to [place] to be closer to grandkids.  Pt notes 7 family members died in the last 2 months.  Pt just wanted to check in before leaving.  Pt taking 6 medications.  Never got a chance to f/u with Nephrology for CKD 3b.  BS stable.  Last hgb A1C 6.5% on 09/13/21.  Taking tamoxifen 20 mg followed by oncology.  Last mammogram 02/24/2021.  Patient notes recent intermittent neck pain.  Thinks it may be due to her pillow.  Denies heavy lifting, pushing pulling, headaches, dizziness, changes in vision.  Past Medical History:  Diagnosis Date   Arthritis    Breast cancer ([place])    Colon polyps    Diabetes mellitus without complication ([place])    GI bleed    Hyperlipidemia    Hypertension    Personal history of chemotherapy    Personal history of radiation therapy    Stroke ([place])     No Known Allergies  ROS General: Denies fever, chills, night sweats, changes in weight, changes in appetite HEENT: Denies headaches, ear pain, changes in vision, rhinorrhea, sore throat CV: Denies CP, palpitations, SOB, orthopnea Pulm: Denies SOB, cough, wheezing GI: Denies abdominal pain, nausea, vomiting, diarrhea, constipation GU: Denies dysuria, hematuria, frequency, vaginal discharge Msk: Denies muscle cramps, joint pains + neck pain Neuro: Denies weakness, numbness, tingling Skin: Denies rashes, bruising Psych: Denies depression, anxiety, hallucinations     Objective:    Blood pressure 132/78, pulse 70, temperature 98.4 F (36.9 C), temperature source Oral, weight 155 lb 12.8 oz (70.7 kg), SpO2 98 %.  Gen. Pleasant, well-nourished, in no distress,  normal affect   HEENT: [place]/AT, face symmetric, conjunctiva clear, no scleral icterus, PERRLA, EOMI, nares patent without drainage Lungs: no accessory muscle use, CTAB, no wheezes or rales Cardiovascular: RRR, no m/r/g, no peripheral edema Musculoskeletal: No deformities, no cyanosis or clubbing, normal tone Neuro:  A&Ox3, CN II-XII intact, ambulating with a cane Skin:  Warm, no lesions/ rash, dry, hirsutism  Wt Readings from Last 3 Encounters:  01/16/22 155 lb 12.8 oz (70.7 kg)  10/16/21 160 lb (72.6 kg)  09/13/21 160 lb 3.2 oz (72.7 kg)    Lab Results  Component Value Date   WBC 6.8 09/13/2021   HGB 12.3 09/13/2021   HCT 38.1 09/13/2021   PLT 162.0 09/13/2021   GLUCOSE 118 (H) 09/13/2021   CHOL 158 09/13/2021   TRIG 48.0 09/13/2021   HDL 55.30 09/13/2021   LDLCALC 93 09/13/2021   ALT 11 09/13/2021   AST 16 09/13/2021   NA 138 09/13/2021   K 4.5 09/13/2021   CL 105 09/13/2021   CREATININE 1.55 (H) 09/13/2021   BUN 24 (H) 09/13/2021   CO2 25 09/13/2021   TSH 2.16 09/13/2021   INR 1.15 11/04/2017   HGBA1C 6.5 09/13/2021   MICROALBUR 2.6 (H) 07/29/2019   Diabetic Foot Exam - Simple   Simple Foot Form Diabetic Foot exam was performed with the following findings: Yes 01/16/2022 11:12 AM  Visual Inspection No deformities, no ulcerations, no other skin breakdown bilaterally: Yes Sensation Testing  Intact to touch and monofilament testing bilaterally: Yes Pulse Check Posterior Tibialis and Dorsalis pulse intact bilaterally: Yes Comments     Assessment/Plan:  Type 2 diabetes mellitus with diabetic neuropathy, without long-term current use of insulin (HCC) -Controlled -Hemoglobin A1c 6.5% on 09/13/2021 -Continue Farxiga 5 mg daily -Continue ARB and statin -Foot exam done this visit -Patient to schedule eye exam  - Plan: Microalbumin/Creatinine Ratio, Urine  Neck pain, acute -Likely musculoskeletal -Discussed obtaining a new pillow to see if symptoms -Continue  supportive care including heat, topical analgesics, Tylenol as needed, massage, stretching -Continue to monitor.  Chronic kidney disease, stage 3b ([place]) -Baseline creatinine 1.34-1.38 with GFR 30s -Creatinine mildly improved on 09/13/2021 at 1.55 with GFR 31.1 -Patient encouraged to follow-up with nephrology in [place] to avoid nephrotoxic medications and renally dose medications  Malignant neoplasm of upper-inner quadrant of right breast in female, estrogen receptor positive ([place]) -Invasive ductal carcinoma  -Staging from 08/02/2014: Stage Ia (pT1c, pN0, cM0, ER: Positive, PR: Positive, HR 2: Positive) -s/p lumpectomychemo and XRT -Continue tamoxifen 20 mg daily to complete in 2024.  Previously on anastrozole started 09/12/2015 however switched to tamoxifen for 2 additional years on 07/2019 2/2 osteoporosis. -DEXA scan 05/12/2019 with T score -2.7 at right hip -Unable to start bisphosphonate for osteoporosis 2/2 CKD -Continue follow-up with oncology  History of TIA (transient ischemic attack) -Continue Plavix 75 mg daily -Monitor for bleeding given history of GIB 05/22/2017 at [place], [place] 5 mg daily -Continue statin Zocor 40 mg daily and BP control  Mixed hyperlipidemia -Total cholesterol 158, HDL 55.3, LDL 93, triglycerides 48 on 09/13/2021 -Continue lifestyle modifications -Continue Zocor 40 mg daily -Plan: Zocor 40 mg, Plavix 75 mg  HTN -Controlled -Continue Coreg 6.25 mg twice daily, losartan 100 mg daily -Continue lifestyle modifications -Plan: Losartan 100 mg, Coreg 6.25 mg  F/u as needed.  Patient to establish care with new provider in [place].  Grier Mitts, MD

## 2022-01-21 MED ORDER — CLOPIDOGREL BISULFATE 75 MG PO TABS: 75.0000 mg | ORAL_TABLET | Freq: Every day | ORAL | 3 refills | Status: DC

## 2022-01-21 MED ORDER — CARVEDILOL 6.25 MG PO TABS: 6.2500 mg | ORAL_TABLET | Freq: Two times a day (BID) | ORAL | 3 refills | Status: DC

## 2022-01-21 MED ORDER — SIMVASTATIN 40 MG PO TABS: 40.0000 mg | ORAL_TABLET | Freq: Every evening | ORAL | 3 refills | Status: DC

## 2022-01-21 MED ORDER — LOSARTAN POTASSIUM 100 MG PO TABS: 100.0000 mg | ORAL_TABLET | Freq: Every day | ORAL | 3 refills | Status: DC

## 2022-02-04 ENCOUNTER — Other Ambulatory Visit: Payer: Self-pay

## 2022-02-04 DIAGNOSIS — Z170 Estrogen receptor positive status [ER+]: Secondary | ICD-10-CM

## 2022-02-05 ENCOUNTER — Inpatient Hospital Stay: Payer: Medicare HMO | Attending: Nurse Practitioner | Admitting: Nurse Practitioner

## 2022-02-05 ENCOUNTER — Inpatient Hospital Stay: Payer: Medicare HMO

## 2022-02-05 NOTE — Progress Notes (Deleted)
[facility]   [phone] [phone]   Clinic Follow up Note   Patient Care Team: [name], MD as PCP - General (Family Medicine) 02/05/2022  CHIEF COMPLAINT: Follow-up right breast cancer  SUMMARY OF ONCOLOGIC HISTORY: Oncology History Overview Note  Cancer Staging Breast cancer of upper-inner quadrant of right female breast [facility]) Staging form: Breast, AJCC 8th Edition - Pathologic stage from 08/02/2014: Stage IA (pT1c, pN0, cM0, G2, ER: Positive, PR: Positive, HER2: Positive) - Signed by Truitt Merle, MD on 01/04/2017 - Clinical: No stage assigned - Unsigned     Breast cancer of upper-inner quadrant of right female breast ([place])  05/25/2014 Mammogram   Suspicious lesion at 3 o'clock position in the right breast   06/15/2014 Breast US   Suspicious nodule, a spiculated hypoechoic mass, 1.1 cm at the 1 o'clock position   06/30/2014 Initial Biopsy   US Guided Core Biopsy: Invasive Ductal Carcinoma, [place] Grade 2. Addendum: ER positive (100%), PR positive (75-80%), HER2 positive    06/30/2014 Initial Diagnosis   Breast cancer of upper-inner quadrant of right female breast ([place]) from Right Breast Lumpectomy: Invasive Ductal Carcinoma, [place] Grade 2, surgical margins free of carcinoma; Sentinel nodes 2/2 free of tumor   08/02/2014 Surgery   Right Breast Lumpectomy with SLN biopsy; Attending physician Dr. Tamera Punt at [facility] in [place], [place]   08/02/2014 Pathology Results   Right breast lumpectomy: Invasive ductal carcinoma, Duanne Moron Grade 2; size or largest invasive carcinoma 1.4 cm; Sentinel lymph nodes 2/2 free of tumor, margins free of tumor    09/22/2014 Echocardiogram   MUGA scan: normal ejection fraction of 57.7%   10/01/2014 - 10/03/2015 Adjuvant Chemotherapy   Adjuvant weekly Taxol from 07/04/44 to 10/01/97 complicated by fatigue, diarrhea, and leukopenia requiring treatment delays and dosage adjustments. Records indicate she  received 8 of 12 doses. Completed adjuvant Herceptin q3 weeks from 10/01/14 to 10/03/15.    06/12/2015 - 08/04/2015 Radiation Therapy   Received 4,860 cGy to right breast in 27 fractions Received 1,600 cGy boost in 8 fractions    09/12/2015 -  Anti-estrogen oral therapy   Anastrozole 72m daily starting 09/12/15. Due to osteoporosis I switched her to Tamoxifen for 2 additional years in 07/2019. Plan to complete in 2024.    06/25/2016 Imaging   CT Chest, Abdomen, Pelvis with contrast, compared with PET/CT on 04/25/2015 and CT 09/06/2014 - Impression: Chest: 1. Unchanged multiple subcentimeter pulmonary nodules bilaterally.there are no new pulmonary nodules. 2. No thoracic lymphadenopathy. 3. New right lung post radiation changes. 4. New diffuse right breast skin thickening suggestive of post radiation changes. Abdomen/pelvis 1. No new mass or lymphadenopathy in the abdomen and pelvis, no CT evidence of metastatic disease. 2. Unchanged adjacent soft tissue nodules inferior to the right hepatic lobe. 3. Colonic diverticulosis. 4. Status post hysterectomy.   06/29/2016 Mammogram   Digital bilateral mammogram with CAD and TOMO findings: the breast parenchyma demonstrates scattered fibroglandular densities. There are no suspicious calcifications, masses, architectural distortion or skin thickening. Surgical clips are present in the right breast at posterior depth. Vascular calcifications are present. No mammographic evidence of malignancy or referred is is gravida   12/23/2017 Mammogram   12/23/2017 Mammogram IMPRESSION: No mammographic evidence of malignancy in either breast, status post right lumpectomy.   12/25/2018 Mammogram   1. No evidence of new or recurrent breast carcinoma. 2. Benign postsurgical changes on the right.     CURRENT THERAPY: Anastrozole 185mdaily starting 09/12/15. Due  to osteoporosis, switched to Tamoxifen for 2 additional years in 07/2019. Plan to complete in 2024.  INTERVAL  HISTORY: Ms. Alberts returns for follow-up as scheduled, last seen by me 02/05/2021.  Screening mammogram 02/24/2021 was negative.  She continues tamoxifen.   REVIEW OF SYSTEMS:   Constitutional: Denies fevers, chills or abnormal weight loss Eyes: Denies blurriness of vision Ears, nose, mouth, throat, and face: Denies mucositis or sore throat Respiratory: Denies cough, dyspnea or wheezes Cardiovascular: Denies palpitation, chest discomfort or lower extremity swelling Gastrointestinal:  Denies nausea, heartburn or change in bowel habits Skin: Denies abnormal skin rashes Lymphatics: Denies new lymphadenopathy or easy bruising Neurological:Denies numbness, tingling or new weaknesses Behavioral/Psych: Mood is stable, no new changes  All other systems were reviewed with the patient and are negative.  MEDICAL HISTORY:  Past Medical History:  Diagnosis Date   Arthritis    Breast cancer ([place])    Colon polyps    Diabetes mellitus without complication ([place])    GI bleed    Hyperlipidemia    Hypertension    Personal history of chemotherapy    Personal history of radiation therapy    Stroke [facility])     SURGICAL HISTORY: Past Surgical History:  Procedure Laterality Date   ABDOMINAL HYSTERECTOMY     30 years ago   BREAST BIOPSY     BREAST LUMPECTOMY Right 08/02/2014   IR REMOVAL TUN ACCESS W/ PORT W/O FL MOD SED  11/04/2017    I have reviewed the social history and family history with the patient and they are unchanged from previous note.  ALLERGIES:  has No Known Allergies.  MEDICATIONS:  Current Outpatient Medications  Medication Sig Dispense Refill   Alcohol Swabs PADS Use as directed 100 each 3   blood glucose meter kit and supplies KIT Dispense based on patient and insurance preference. Use up to four times daily as directed. (FOR ICD-9 250.00, 250.01). 1 each 0   Blood Glucose Monitoring Suppl (TRUE METRIX AIR GLUCOSE METER) DEVI Use as directed 100 each 3   calcium-vitamin D (OSCAL  WITH D) 500-200 MG-UNIT tablet Take 1 tablet by mouth daily with breakfast. 90 tablet 3   carvedilol (COREG) 6.25 MG tablet Take 1 tablet (6.25 mg total) by mouth 2 (two) times daily with a meal. 180 tablet 3   clopidogrel (PLAVIX) 75 MG tablet Take 1 tablet (75 mg total) by mouth daily. 90 tablet 3   dapagliflozin propanediol (FARXIGA) 5 MG TABS tablet Take 1 tablet (5 mg total) by mouth daily before breakfast. 90 tablet 3   glucose blood (TRUE METRIX BLOOD GLUCOSE TEST) test strip Use as instructed 100 each 12   losartan (COZAAR) 100 MG tablet Take 1 tablet (100 mg total) by mouth daily. 90 tablet 3   simvastatin (ZOCOR) 40 MG tablet Take 1 tablet (40 mg total) by mouth every evening. 90 tablet 3   tamoxifen (NOLVADEX) 20 MG tablet Take 1 tablet (20 mg total) by mouth daily. 90 tablet 3   TRUEplus Lancets 28G MISC Use as directed 100 each 3   Current Facility-Administered Medications  Medication Dose Route Frequency Provider Last Rate Last Admin   0.9 %  sodium chloride infusion  500 mL Intravenous Once Irene Shipper, MD        PHYSICAL EXAMINATION: ECOG PERFORMANCE STATUS: {CHL ONC ECOG NG:[id number]}  There were no vitals filed for this visit. There were no vitals filed for this visit.  GENERAL:alert, no distress and comfortable SKIN: skin color,  texture, turgor are normal, no rashes or significant lesions EYES: normal, Conjunctiva are pink and non-injected, sclera clear OROPHARYNX:no exudate, no erythema and lips, buccal mucosa, and tongue normal  NECK: supple, thyroid normal size, non-tender, without nodularity LYMPH:  no palpable lymphadenopathy in the cervical, axillary or inguinal LUNGS: clear to auscultation and percussion with normal breathing effort HEART: regular rate & rhythm and no murmurs and no lower extremity edema ABDOMEN:abdomen soft, non-tender and normal bowel sounds Musculoskeletal:no cyanosis of digits and no clubbing  NEURO: alert & oriented x 3 with fluent  speech, no focal motor/sensory deficits  LABORATORY DATA:  I have reviewed the data as listed    Latest Ref Rng & Units 09/13/2021   10:11 AM 02/05/2021   10:24 AM 02/04/2020    9:25 AM  CBC  WBC 4.0 - 10.5 K/uL 6.8  5.0  6.0   Hemoglobin 12.0 - 15.0 g/dL 12.3  11.6  12.0   Hematocrit 36.0 - 46.0 % 38.1  36.0  39.0   Platelets 150.0 - 400.0 K/uL 162.0  167  175         Latest Ref Rng & Units 09/13/2021   10:11 AM 02/05/2021   10:24 AM 02/04/2020    9:25 AM  CMP  Glucose 70 - 99 mg/dL 118  112  128   BUN 6 - 23 mg/dL '24  15  18   ' Creatinine 0.40 - 1.20 mg/dL 1.55  1.34  1.38   Sodium 135 - 145 mEq/L 138  140  141   Potassium 3.5 - 5.1 mEq/L 4.5  4.4  5.1   Chloride 96 - 112 mEq/L 105  108  110   CO2 19 - 32 mEq/L '25  26  28   ' Calcium 8.4 - 10.5 mg/dL 9.4  9.0  9.5   Total Protein 6.0 - 8.3 g/dL 7.3  6.6  7.0   Total Bilirubin 0.2 - 1.2 mg/dL 0.3  0.4  0.3   Alkaline Phos 39 - 117 U/L 42  42  51   AST 0 - 37 U/L '16  14  14   ' ALT 0 - 35 U/L '11  11  11       ' RADIOGRAPHIC STUDIES: I have personally reviewed the radiological images as listed and agreed with the findings in the report. No results found.   ASSESSMENT & PLAN:  No problem-specific Assessment & Plan notes found for this encounter.   No orders of the defined types were placed in this encounter.  All questions were answered. The patient knows to call the clinic with any problems, questions or concerns. No barriers to learning was detected. I spent {CHL ONC TIME VISIT - JGOTL:[id number]} counseling the patient face to face. The total time spent in the appointment was {CHL ONC TIME VISIT - RCBUL:[id number]} and more than 50% was on counseling and review of test results     Alla Feeling, NP 02/05/22

## 2022-03-12 ENCOUNTER — Telehealth: Payer: Self-pay | Admitting: Family Medicine

## 2022-03-12 NOTE — Telephone Encounter (Signed)
Pt daughter Elwin Sleight is calling on behalf of her mother. Pt need  letter for metro access transportation the letter needs to state pt walks with a cane and unable to walk long distance. Pt is trying to get transportation set up for her doctor appts

## 2022-03-19 ENCOUNTER — Other Ambulatory Visit: Payer: Self-pay | Admitting: Family Medicine

## 2022-03-19 DIAGNOSIS — Z8673 Personal history of transient ischemic attack (TIA), and cerebral infarction without residual deficits: Secondary | ICD-10-CM

## 2022-03-19 DIAGNOSIS — I10 Essential (primary) hypertension: Secondary | ICD-10-CM

## 2022-03-26 NOTE — Telephone Encounter (Signed)
Okay for letter

## 2022-04-02 DIAGNOSIS — I10 Essential (primary) hypertension: Secondary | ICD-10-CM | POA: Diagnosis not present

## 2022-04-02 DIAGNOSIS — Z8673 Personal history of transient ischemic attack (TIA), and cerebral infarction without residual deficits: Secondary | ICD-10-CM | POA: Diagnosis not present

## 2022-04-02 DIAGNOSIS — Z853 Personal history of malignant neoplasm of breast: Secondary | ICD-10-CM | POA: Diagnosis not present

## 2022-04-02 DIAGNOSIS — E782 Mixed hyperlipidemia: Secondary | ICD-10-CM | POA: Diagnosis not present

## 2022-04-02 DIAGNOSIS — Z7689 Persons encountering health services in other specified circumstances: Secondary | ICD-10-CM | POA: Diagnosis not present

## 2022-04-02 DIAGNOSIS — E119 Type 2 diabetes mellitus without complications: Secondary | ICD-10-CM | POA: Diagnosis not present

## 2022-04-02 DIAGNOSIS — M1710 Unilateral primary osteoarthritis, unspecified knee: Secondary | ICD-10-CM | POA: Diagnosis not present

## 2022-04-02 DIAGNOSIS — Z13820 Encounter for screening for osteoporosis: Secondary | ICD-10-CM | POA: Diagnosis not present

## 2022-09-21 ENCOUNTER — Encounter: Payer: Self-pay | Admitting: Hematology

## 2022-10-14 ENCOUNTER — Encounter: Payer: Self-pay | Admitting: Hematology

## 2022-10-21 ENCOUNTER — Telehealth: Payer: Self-pay

## 2022-10-21 NOTE — Telephone Encounter (Signed)
Unsuccessful attempt to reach patient on preferred number listed in notes for scheduled AWV. Left message on voicemail okay to reschedule. 

## 2022-11-06 ENCOUNTER — Telehealth: Payer: Self-pay

## 2022-11-06 ENCOUNTER — Other Ambulatory Visit: Payer: Self-pay

## 2022-11-06 DIAGNOSIS — C50211 Malignant neoplasm of upper-inner quadrant of right female breast: Secondary | ICD-10-CM

## 2022-11-06 NOTE — Telephone Encounter (Signed)
Pt called requesting to change care to Dr. Ferne Reus w/[facility] which is closer to the pt's home.  Pt now lives in [place].  Pt provided contact information and fax number to Dr. Urban Gibson office.  Faxed last office note, referral order, and pt demographics to Dr. Urban Gibson office.  Fax confirmation received.

## 2022-11-21 ENCOUNTER — Telehealth: Payer: Self-pay

## 2022-11-21 NOTE — Telephone Encounter (Signed)
LVM for patient to call back [phone], or to call PCP office to schedule follow up apt. AS, CMA  

## 2022-12-04 ENCOUNTER — Other Ambulatory Visit: Payer: Self-pay

## 2022-12-10 ENCOUNTER — Telehealth: Payer: Medicare PPO | Admitting: Family Medicine

## 2022-12-10 NOTE — Progress Notes (Unsigned)
error 

## 2022-12-12 ENCOUNTER — Telehealth: Payer: Self-pay

## 2022-12-12 NOTE — Telephone Encounter (Signed)
LVM for patient to call back [phone], or to call PCP office to schedule follow up apt. AS, CMA  

## 2022-12-27 ENCOUNTER — Telehealth: Payer: Self-pay

## 2022-12-27 NOTE — Telephone Encounter (Signed)
Faxed copy of last labs, last 3 office notes, past CT scan to [facility] as per there request. Was faxed to [phone]. Received fax confirmation of receipt.

## 2023-02-18 ENCOUNTER — Other Ambulatory Visit: Payer: Self-pay | Admitting: Family Medicine

## 2023-02-18 DIAGNOSIS — I10 Essential (primary) hypertension: Secondary | ICD-10-CM

## 2023-02-18 DIAGNOSIS — Z8673 Personal history of transient ischemic attack (TIA), and cerebral infarction without residual deficits: Secondary | ICD-10-CM

## 2023-04-21 IMAGING — MG MM DIGITAL SCREENING BILAT W/ TOMO AND CAD
8 series · 8 of 24 positions shown · non-contrast
Comparison: Previous exam(s).

CLINICAL DATA: Screening. History of treated right breast cancer,
status post breast conservation therapy.

EXAM:
DIGITAL SCREENING BILATERAL MAMMOGRAM WITH TOMOSYNTHESIS AND CAD
TECHNIQUE: Bilateral screening digital craniocaudal and mediolateral oblique
mammograms were obtained. Bilateral screening digital breast
tomosynthesis was performed. The images were evaluated with
computer-aided detection.

[L CC synth-2D]
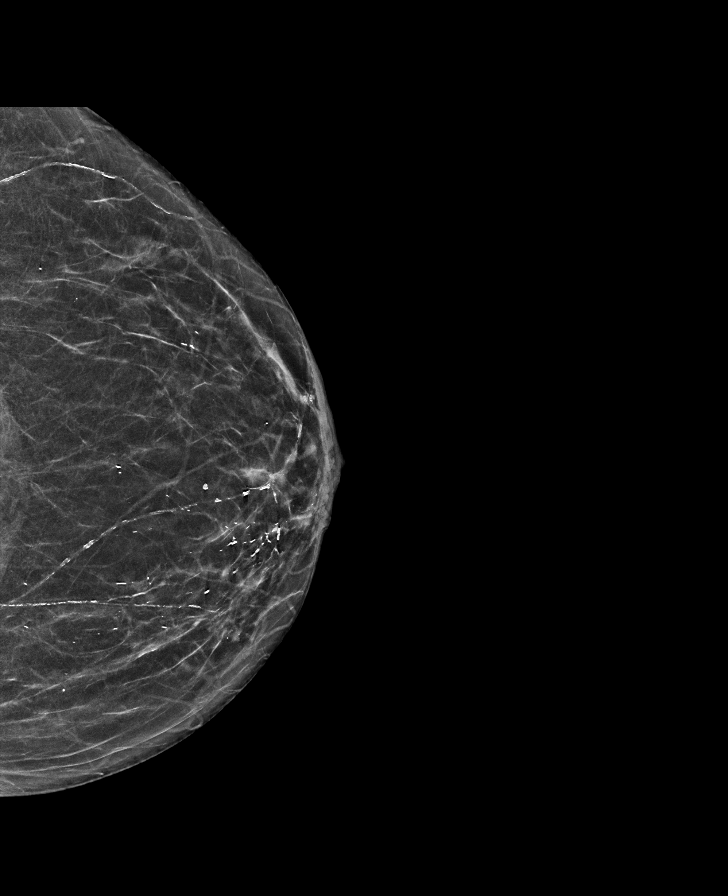

[R CC synth-2D]
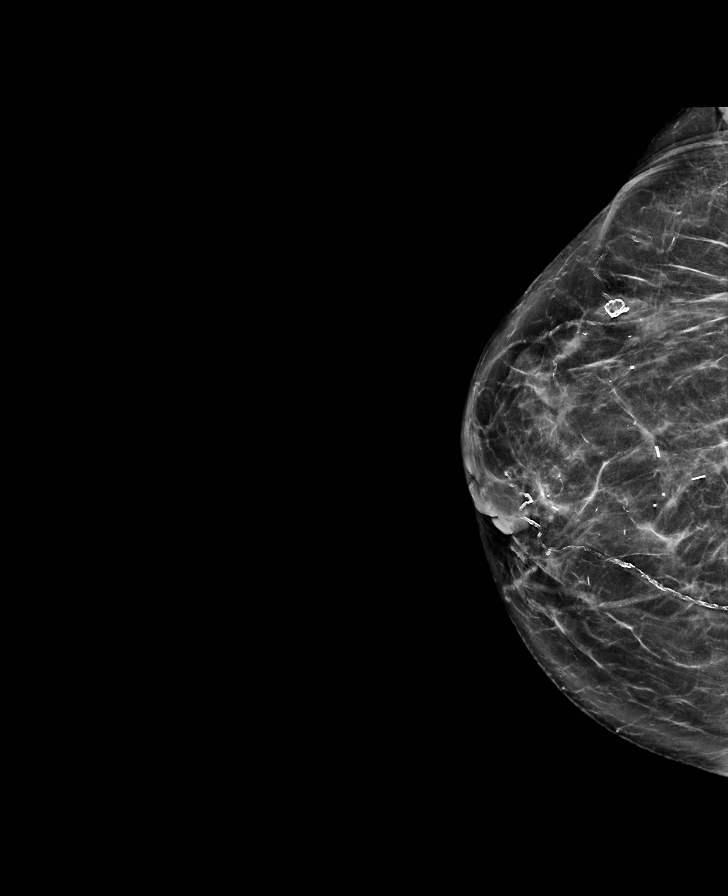

[L MLO synth-2D]
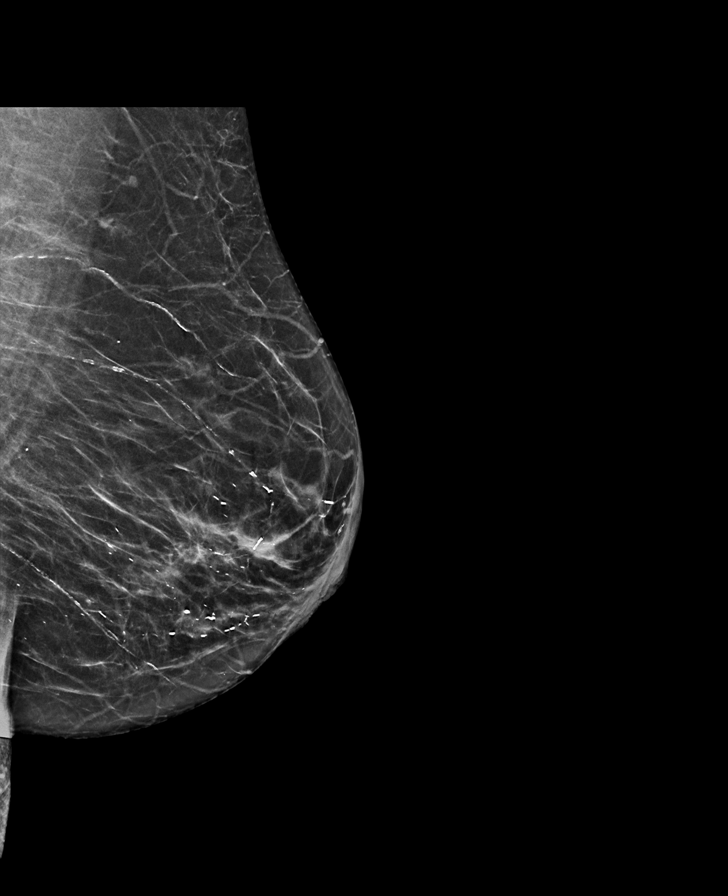

[R MLO synth-2D]
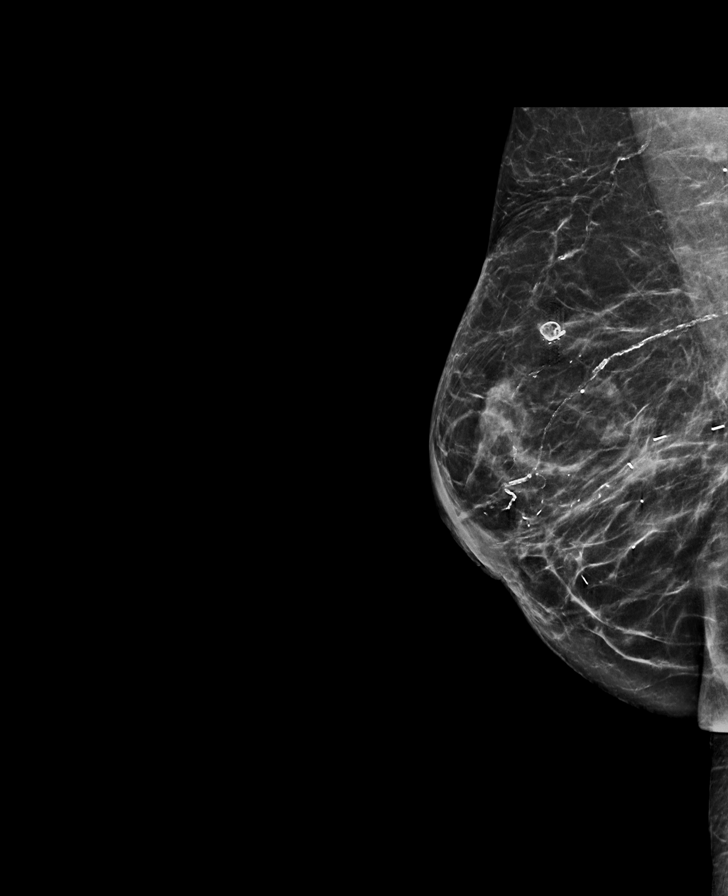

[R MLO tomo · tomo slice 35/70.0]
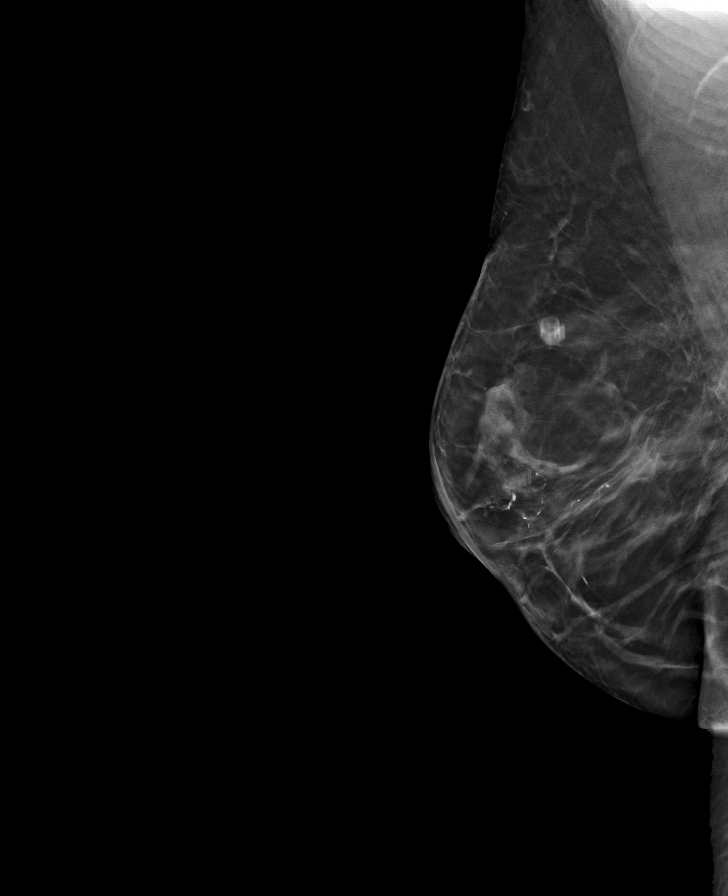

[L MLO tomo · tomo slice 31/60.0]
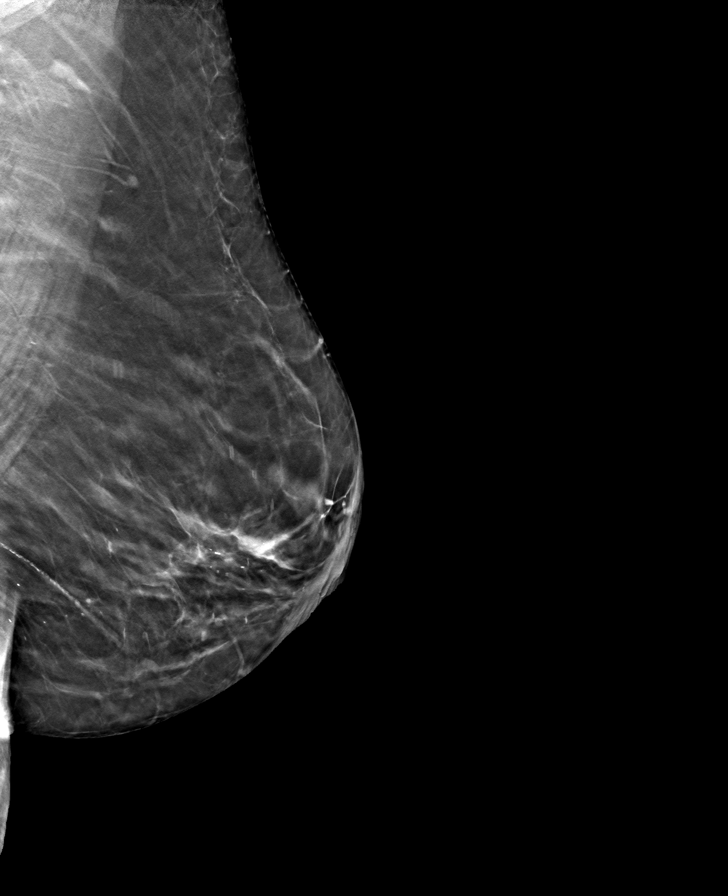

[R CC tomo · tomo slice 34/67.0]
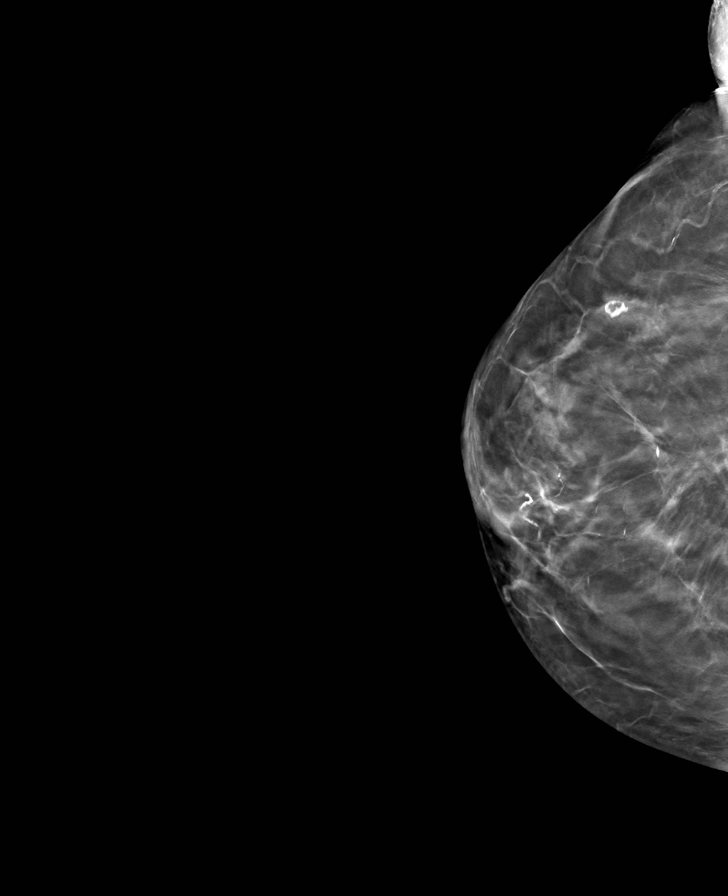

[L CC tomo · tomo slice 27/53.0]
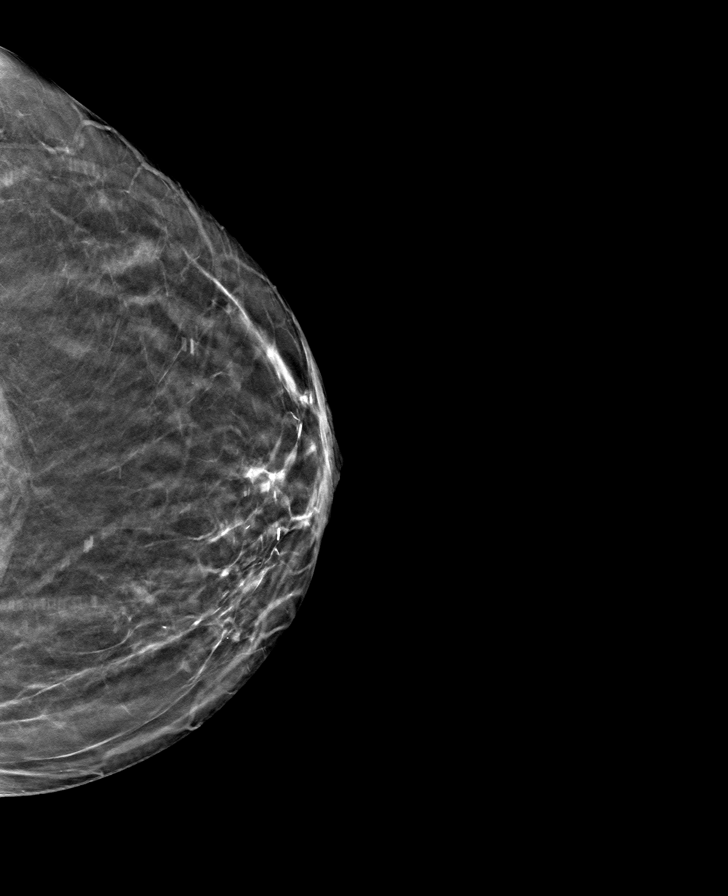

[8 of 24 positions shown; findings below may reference images not displayed]

ACR Breast Density Category b: There are scattered areas of
fibroglandular density.
FINDINGS: There are no findings suspicious for malignancy.
IMPRESSION: No mammographic evidence of malignancy. A result letter of this
screening mammogram will be mailed directly to the patient.

RECOMMENDATION:
Screening mammogram in one year. (Code:QL-O-KU6)

BI-RADS CATEGORY  1: Negative.
